# Patient Record
Sex: Male | Born: 1945 | Race: White | Hispanic: No | Marital: Married | Smoking: Never smoker
Health system: Southern US, Community
[De-identification: ages and names within clinical notes are randomized; demographics above are authoritative.]

## PROBLEM LIST (undated history)

## (undated) DIAGNOSIS — R519 Headache, unspecified: Secondary | ICD-10-CM

## (undated) DIAGNOSIS — N189 Chronic kidney disease, unspecified: Secondary | ICD-10-CM

## (undated) DIAGNOSIS — E119 Type 2 diabetes mellitus without complications: Secondary | ICD-10-CM

## (undated) DIAGNOSIS — I499 Cardiac arrhythmia, unspecified: Secondary | ICD-10-CM

## (undated) DIAGNOSIS — I10 Essential (primary) hypertension: Secondary | ICD-10-CM

## (undated) DIAGNOSIS — J45909 Unspecified asthma, uncomplicated: Secondary | ICD-10-CM

## (undated) DIAGNOSIS — K5731 Diverticulosis of large intestine without perforation or abscess with bleeding: Secondary | ICD-10-CM

## (undated) DIAGNOSIS — T8859XA Other complications of anesthesia, initial encounter: Secondary | ICD-10-CM

## (undated) DIAGNOSIS — R51 Headache: Secondary | ICD-10-CM

## (undated) DIAGNOSIS — G4730 Sleep apnea, unspecified: Secondary | ICD-10-CM

## (undated) DIAGNOSIS — T4145XA Adverse effect of unspecified anesthetic, initial encounter: Secondary | ICD-10-CM

## (undated) DIAGNOSIS — I82409 Acute embolism and thrombosis of unspecified deep veins of unspecified lower extremity: Secondary | ICD-10-CM

## (undated) DIAGNOSIS — J449 Chronic obstructive pulmonary disease, unspecified: Secondary | ICD-10-CM

## (undated) HISTORY — PX: CYSTOSCOPY W/ URETERAL STENT REMOVAL: SHX1430

## (undated) HISTORY — DX: Type 2 diabetes mellitus without complications: E11.9

## (undated) HISTORY — PX: KNEE ARTHROSCOPY: SUR90

## (undated) HISTORY — PX: CYSTOSCOPY W/ URETERAL STENT PLACEMENT: SHX1429

---

## 2000-05-15 ENCOUNTER — Emergency Department (HOSPITAL_COMMUNITY): Admission: EM | Admit: 2000-05-15 | Discharge: 2000-05-16 | Payer: Self-pay | Admitting: *Deleted

## 2000-05-15 ENCOUNTER — Encounter: Payer: Self-pay | Admitting: *Deleted

## 2003-01-29 ENCOUNTER — Ambulatory Visit (HOSPITAL_COMMUNITY): Admission: RE | Admit: 2003-01-29 | Discharge: 2003-01-29 | Payer: Self-pay | Admitting: Pulmonary Disease

## 2003-01-29 ENCOUNTER — Encounter: Payer: Self-pay | Admitting: Pulmonary Disease

## 2006-07-11 HISTORY — PX: EYE SURGERY: SHX253

## 2006-12-18 ENCOUNTER — Ambulatory Visit: Payer: Self-pay | Admitting: Pulmonary Disease

## 2006-12-29 ENCOUNTER — Encounter: Admission: RE | Admit: 2006-12-29 | Discharge: 2006-12-29 | Payer: Self-pay | Admitting: Orthopedic Surgery

## 2006-12-31 ENCOUNTER — Encounter: Admission: RE | Admit: 2006-12-31 | Discharge: 2006-12-31 | Payer: Self-pay | Admitting: Orthopedic Surgery

## 2007-01-16 ENCOUNTER — Encounter: Admission: RE | Admit: 2007-01-16 | Discharge: 2007-02-14 | Payer: Self-pay | Admitting: Orthopedic Surgery

## 2007-01-18 ENCOUNTER — Ambulatory Visit: Payer: Self-pay | Admitting: Critical Care Medicine

## 2007-02-12 ENCOUNTER — Ambulatory Visit: Payer: Self-pay | Admitting: Critical Care Medicine

## 2007-04-11 ENCOUNTER — Ambulatory Visit (HOSPITAL_COMMUNITY): Admission: RE | Admit: 2007-04-11 | Discharge: 2007-04-11 | Payer: Self-pay | Admitting: Urology

## 2007-04-16 ENCOUNTER — Ambulatory Visit (HOSPITAL_COMMUNITY): Admission: RE | Admit: 2007-04-16 | Discharge: 2007-04-16 | Payer: Self-pay | Admitting: Urology

## 2007-04-18 ENCOUNTER — Ambulatory Visit: Payer: Self-pay | Admitting: Critical Care Medicine

## 2007-04-18 DIAGNOSIS — J309 Allergic rhinitis, unspecified: Secondary | ICD-10-CM | POA: Insufficient documentation

## 2007-04-18 DIAGNOSIS — J45909 Unspecified asthma, uncomplicated: Secondary | ICD-10-CM | POA: Insufficient documentation

## 2007-05-14 ENCOUNTER — Ambulatory Visit (HOSPITAL_COMMUNITY): Admission: RE | Admit: 2007-05-14 | Discharge: 2007-05-14 | Payer: Self-pay | Admitting: Urology

## 2007-07-16 ENCOUNTER — Ambulatory Visit: Payer: Self-pay | Admitting: Critical Care Medicine

## 2007-08-28 ENCOUNTER — Ambulatory Visit: Payer: Self-pay | Admitting: Critical Care Medicine

## 2007-09-24 ENCOUNTER — Ambulatory Visit (HOSPITAL_COMMUNITY): Admission: RE | Admit: 2007-09-24 | Discharge: 2007-09-24 | Payer: Self-pay | Admitting: Urology

## 2007-10-15 ENCOUNTER — Ambulatory Visit: Payer: Self-pay | Admitting: Critical Care Medicine

## 2007-11-20 ENCOUNTER — Ambulatory Visit: Payer: Self-pay | Admitting: Pulmonary Disease

## 2007-12-24 ENCOUNTER — Ambulatory Visit: Payer: Self-pay | Admitting: Critical Care Medicine

## 2008-01-28 ENCOUNTER — Ambulatory Visit: Payer: Self-pay | Admitting: Critical Care Medicine

## 2008-04-07 ENCOUNTER — Ambulatory Visit: Payer: Self-pay | Admitting: Critical Care Medicine

## 2008-06-10 ENCOUNTER — Ambulatory Visit: Payer: Self-pay | Admitting: Critical Care Medicine

## 2008-06-12 ENCOUNTER — Telehealth (INDEPENDENT_AMBULATORY_CARE_PROVIDER_SITE_OTHER): Payer: Self-pay | Admitting: *Deleted

## 2008-06-24 ENCOUNTER — Ambulatory Visit: Payer: Self-pay | Admitting: Critical Care Medicine

## 2008-07-11 HISTORY — PX: CERVICAL FUSION: SHX112

## 2008-07-11 HISTORY — PX: CYSTOSCOPY WITH RETROGRADE PYELOGRAM, URETEROSCOPY AND STENT PLACEMENT: SHX5789

## 2008-07-11 HISTORY — PX: CYSTOSCOPY W/ URETERAL STENT REMOVAL: SHX1430

## 2008-07-29 ENCOUNTER — Telehealth (INDEPENDENT_AMBULATORY_CARE_PROVIDER_SITE_OTHER): Payer: Self-pay | Admitting: *Deleted

## 2008-09-02 ENCOUNTER — Ambulatory Visit: Payer: Self-pay | Admitting: Critical Care Medicine

## 2008-09-02 DIAGNOSIS — J018 Other acute sinusitis: Secondary | ICD-10-CM | POA: Insufficient documentation

## 2008-09-03 ENCOUNTER — Telehealth: Payer: Self-pay | Admitting: Critical Care Medicine

## 2008-11-03 ENCOUNTER — Ambulatory Visit: Payer: Self-pay | Admitting: Critical Care Medicine

## 2008-12-20 ENCOUNTER — Emergency Department (HOSPITAL_COMMUNITY): Admission: EM | Admit: 2008-12-20 | Discharge: 2008-12-20 | Payer: Self-pay | Admitting: Emergency Medicine

## 2008-12-20 ENCOUNTER — Encounter (INDEPENDENT_AMBULATORY_CARE_PROVIDER_SITE_OTHER): Payer: Self-pay | Admitting: Emergency Medicine

## 2008-12-20 ENCOUNTER — Ambulatory Visit: Payer: Self-pay | Admitting: *Deleted

## 2009-01-05 ENCOUNTER — Ambulatory Visit: Payer: Self-pay | Admitting: Critical Care Medicine

## 2009-05-01 ENCOUNTER — Ambulatory Visit: Payer: Self-pay | Admitting: Critical Care Medicine

## 2009-07-27 ENCOUNTER — Ambulatory Visit (HOSPITAL_COMMUNITY): Admission: RE | Admit: 2009-07-27 | Discharge: 2009-07-27 | Payer: Self-pay | Admitting: Urology

## 2009-09-04 ENCOUNTER — Ambulatory Visit: Payer: Self-pay | Admitting: Critical Care Medicine

## 2009-12-01 ENCOUNTER — Ambulatory Visit: Payer: Self-pay | Admitting: Critical Care Medicine

## 2009-12-18 ENCOUNTER — Emergency Department (HOSPITAL_COMMUNITY): Admission: EM | Admit: 2009-12-18 | Discharge: 2009-12-18 | Payer: Self-pay | Admitting: Emergency Medicine

## 2009-12-18 ENCOUNTER — Ambulatory Visit: Payer: Self-pay | Admitting: Vascular Surgery

## 2009-12-18 ENCOUNTER — Encounter (INDEPENDENT_AMBULATORY_CARE_PROVIDER_SITE_OTHER): Payer: Self-pay | Admitting: Emergency Medicine

## 2010-04-20 ENCOUNTER — Encounter: Admission: RE | Admit: 2010-04-20 | Discharge: 2010-04-20 | Payer: Self-pay | Admitting: Specialist

## 2010-04-28 ENCOUNTER — Ambulatory Visit: Payer: Self-pay | Admitting: Critical Care Medicine

## 2010-05-05 ENCOUNTER — Telehealth: Payer: Self-pay | Admitting: Critical Care Medicine

## 2010-07-16 ENCOUNTER — Ambulatory Visit
Admission: RE | Admit: 2010-07-16 | Discharge: 2010-07-16 | Payer: Self-pay | Source: Home / Self Care | Attending: Critical Care Medicine | Admitting: Critical Care Medicine

## 2010-07-21 ENCOUNTER — Ambulatory Visit
Admission: RE | Admit: 2010-07-21 | Discharge: 2010-07-21 | Payer: Self-pay | Source: Home / Self Care | Attending: Critical Care Medicine | Admitting: Critical Care Medicine

## 2010-07-21 ENCOUNTER — Telehealth: Payer: Self-pay | Admitting: Critical Care Medicine

## 2010-07-31 ENCOUNTER — Other Ambulatory Visit: Payer: Self-pay | Admitting: Neurosurgery

## 2010-07-31 DIAGNOSIS — M5410 Radiculopathy, site unspecified: Secondary | ICD-10-CM

## 2010-07-31 DIAGNOSIS — M542 Cervicalgia: Secondary | ICD-10-CM

## 2010-08-10 NOTE — Assessment & Plan Note (Signed)
Summary: Pulmonary OV   Primary Provider/Referring Provider:  Dr. Lerry L[facility] @ [facility], fax # [phone]  CC:  4 month follow up.  states he is waking up at night with incrased SOB x1 wk and states he is coughing but only prod 1-2 times daily with green mucus x2wk.  had 1 episode of wheezing x2days ago.  denies chest tightness.  [name] [name]  History of Present Illness: Pulmonary OV:  This is a 65 year old, white male with severe persistent asthma.   September 04, 2009 10:46 AM Started with at bedtime dyspnea,  notes some coughing for  two weeks,  some pndrip,   Noting some wheeze.   Pt denies any significant sore throat, nasal congestion or excess secretions, fever, chills, sweats, unintended weight loss, pleurtic or exertional chest pain, orthopnea PND, or leg swelling Pt denies any increase in rescue therapy over baseline, denies waking up needing it or having any early am or nocturnal exacerbations of coughing/wheezing/or dyspnea.    Asthma History    Asthma Control Assessment:    Age range: 12+ years    Symptoms: >2 days/week    Nighttime Awakenings: 1-3/week    Interferes w/ normal activity: some limitations    SABA use (not for EIB): 0-2 days/week    ATAQ questionnaire: 0    FEV1: 3.35 liters (today)    Exacerbations requiring oral systemic steroids: 0-1/year    Asthma Control Assessment: Not Well Controlled   Preventive Screening-Counseling & Management  Alcohol-Tobacco     Smoking Status: never  Current Medications (verified): 1)  Azor 5-20 Mg  Tabs (Amlodipine-Olmesartan) .... One By Mouth Once Daily 2)  Hydrochlorothiazide   Powd (Hydrochlorothiazide) .[name] [name].. 12.5 By Mouth Once Daily 3)  Symbicort 160-4.5 Mcg/act  Aero (Budesonide-Formoterol Fumarate) .... Two Puff Two Times A Day 4)  Ventolin Hfa 108 (90 Base) Mcg/act  Aers (Albuterol Sulfate) .[name] [name].. 1-2 Puffs Every 4-6 Hours As Needed 5)  Potassium Citrate 1080 Mg  Tbcr (Potassium Citrate) .... 2 By  Mouth Two Times A Day 6)  Qvar 80 Mcg/act Aers (Beclomethasone Dipropionate) .... 2 Puffs Two Times A Day 7)  Albuterol Sulfate (2.5 Mg/63ml) 0.083% Nebu (Albuterol Sulfate) .... As Needed  Allergies (verified): 1)  ! Penicillin  Past History:  Past medical, surgical, family and social histories (including risk factors) reviewed, and no changes noted (except as noted below).  Past Medical History: Reviewed history from 04/07/2008 and no changes required. Allergic rhinitis Asthma    -FeV1 108%  Fef 25-75 58% 1/09  Family History: Reviewed history from 08/28/2007 and no changes required. MI/Heart Attack grandmother  Diabetes  Social History: Reviewed history from 01/28/2008 and no changes required. Patient states former smoker.  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  The patient denies shortness of breath at rest, coughing up blood, chest pain, irregular heartbeats, acid heartburn, indigestion, loss of appetite, weight change, abdominal pain, difficulty swallowing, sore throat, tooth/dental problems, headaches, nasal congestion/difficulty breathing through nose, sneezing, itching, ear ache, anxiety, depression, hand/feet swelling, joint stiffness or pain, rash, change in color of mucus, and fever.    Vital Signs:  Patient profile:   65 year old male Height:      67 inches Weight:      174.38 pounds BMI:     27.41 O2 Sat:  96 % on Room air Temp:     98.4 degrees F oral Pulse rate:   85 / minute BP sitting:   122 / 70  (left arm) Cuff size:   regular  Vitals Entered By: Gweneth Dimitri RN (September 04, 2009 10:29 AM)  O2 Flow:  Room air CC: 4 month follow up.  states he is waking up at night with incrased SOB x1 wk, states he is coughing but only prod 1-2 times daily with green mucus  x2wk.  had 1 episode of wheezing x2days ago.  denies chest tightness.   Comments Medications reviewed with patient Daytime contact number verified with patient. Gweneth Dimitri RN  September 04, 2009 10:33 AM    Physical Exam  Additional Exam:  Gen: Pleasant, well nourished, in no distress ENT: no lesions, no postnasal drip, mild nasal erythema with nasal purulence Neck: No JVD, no TMG, no carotid bruits Lungs: no use of accessory muscles, no dullness to percussion, distant bs Cardiovascular: RRR, heart sounds normal, no murmurs or gallops, no peripheral edema Musculoskeletal: No deformities, no cyanosis or clubbing     Pre-Spirometry FEV1    Value: 3.35 L     Impression & Recommendations:  Problem # 1:  OTHER ACUTE SINUSITIS (ICD-461.8) Assessment Deteriorated Acute sinusitis with asthma flare plan Depomedrol 120mg  injection will be given Zpak for five days No change in inhalers Use the NeilMed nasal rinse at least daily  Return 3 months His updated medication list for this problem includes:    Zithromax Z-pak 250 Mg Tabs (Azithromycin) .[name] [name]... As directed generic please  Orders: Depo- Medrol 40mg  (J1030) Depo- Medrol 80mg  (J1040) Admin of Therapeutic Inj  intramuscular or subcutaneous (36644)  Medications Added to Medication List This Visit: 1)  Zithromax Z-pak 250 Mg Tabs (Azithromycin) .... As directed generic please  Complete Medication List: 1)  Azor 5-20 Mg Tabs (Amlodipine-olmesartan) .... One by mouth once daily 2)  Hydrochlorothiazide Powd (Hydrochlorothiazide) .[name] [name].. 12.5 by mouth once daily 3)  Symbicort 160-4.5 Mcg/act Aero (Budesonide-formoterol fumarate) .... Two puff two times a day 4)  Ventolin Hfa 108 (90 Base) Mcg/act Aers (Albuterol sulfate) .[name] [name].. 1-2 puffs every 4-6 hours as needed 5)  Potassium Citrate 1080 Mg Tbcr (Potassium citrate) .... 2 by mouth two times a day 6)  Qvar 80 Mcg/act Aers (Beclomethasone dipropionate) .... 2 puffs two times a day 7)   Albuterol Sulfate (2.5 Mg/63ml) 0.083% Nebu (Albuterol sulfate) .... As needed 8)  Zithromax Z-pak 250 Mg Tabs (Azithromycin) .... As directed generic please  Patient Instructions: 1)  Depomedrol 120mg  injection will be given 2)  Zpak for five days 3)  No change in inhalers 4)  Use the NeilMed nasal rinse at least daily washing out both nares thoroughly.  Place one packet of Sinus Wash ingredients into the nasal wash bottle then fill to the dotted line with lukewarm tap water.  Lean over the sink and rinse each nostril out thoroughly and avoid letting the rinse go into the throat.   5)  Return 3 months Prescriptions: ZITHROMAX Z-PAK 250 MG  TABS (AZITHROMYCIN) As directed generic please  #1 pak x 0   Entered and Authorized by:   Storm Frisk MD   Signed by:   Storm Frisk MD on 09/04/2009   Method used:   Electronically to        [facility] [address] #[id number]* (retail)       [address]       [place],  [place]  [address]       Ph: [phone]       Fax: [phone]   RxID:   [id number] WUXL 24 MCG/ACT AERS (BECLOMETHASONE DIPROPIONATE) 2 puffs two times a day  #7.3 Gram x 5   Entered and Authorized by:   Storm Frisk MD   Signed by:   Storm Frisk MD on 09/04/2009   Method used:   Electronically to        [facility] [address] #[id number]* (retail)       [address] [place], [place]  [address]       Ph: [phone]       Fax: [phone]   RxID:   [id number]    Immunization History:  Pneumovax Immunization History:    Pneumovax:  historical (04/10/2008)    Medication Administration  Injection # 1:    Medication: Depo- Medrol 40mg     Diagnosis: OTHER ACUTE SINUSITIS (ICD-461.8)    Route: IM    Site: LUOQ gluteus    Exp Date: 05/2012    Lot #: [id number]    Mfr: Pharmacia    Patient tolerated injection without complications    Given by: Gweneth Dimitri RN (September 04, 2009 11:01 AM)  Injection # 2:    Medication: Depo- Medrol 80mg      Diagnosis: OTHER ACUTE SINUSITIS (ICD-461.8)    Route: IM    Site: LUOQ gluteus    Exp Date: 05/2012    Lot #: [id number]    Mfr: Pharmacia    Patient tolerated injection without complications    Given by: Gweneth Dimitri RN (September 04, 2009 11:01 AM)  Orders Added: 1)  Depo- Medrol 40mg  [J1030] 2)  Depo- Medrol 80mg  [J1040] 3)  Admin of Therapeutic Inj  intramuscular or subcutaneous [60109]

## 2010-08-10 NOTE — Progress Notes (Signed)
Summary: PA denial for [facility]  Phone Note Outgoing Call   Call placed by: Reynaldo Minium CMA,  May 05, 2010 4:37 PM Call placed to: [place] Dept-Medicaid Summary of Call: Recieved PA ([facility] [phone])for patients [facility]-called [phone]- insurance will not cover Rx until pt has tried and failed Advair. I have sent this message to PW and given the paper PA information to Crystal. Please advise. Initial call taken by: Reynaldo Minium CMA,  May 05, 2010 4:38 PM  Follow-up for Phone Call        We will need to appeal this. He has failed advair in the past and is well controlled on dulera  Follow-up by: Storm Frisk MD,  May 05, 2010 5:04 PM  Additional Follow-up for Phone Call Additional follow up Details #1::        Called [employer] at number above for the appeals process.  Spoke with Sasha in the appeals department.  Per Sasha, both the docter and pt will receive a letter in the mail regarding the denial of the medication.  Along with this letter will be the process of the appeal.  Will forward message to PW so he is aware.  Gweneth Dimitri RN  May 07, 2010 5:21 PM     Additional Follow-up for Phone Call Additional follow up Details #2::    will await appeal form Follow-up by: Storm Frisk MD,  May 10, 2010 5:33 PM  Additional Follow-up for Phone Call Additional follow up Details #3:: Details for Additional Follow-up Action Taken: ok but note pt has been on advair in past and failed Additional Follow-up by: Storm Frisk MD,  May 24, 2010 3:32 PM   Appended Document: PA denial for [facility], spoke with pt to see if he has received denial letter from insurance company regarding the dulera.  Per pt, he has not and has filled this med with no problems.  I called [facility] on [address] and spoke with [employer].  Per Elmore Guise was filled on Nov 5 and insurance paid for it.  States there is no prior auth needed for dulera at this time.

## 2010-08-10 NOTE — Assessment & Plan Note (Signed)
Summary: Pulmonary OV   Primary Provider/Referring Provider:  Dr. Lerry Liner @ [facility], fax # [phone]  CC:  3  month followup.  Pt c/o increased SOB x 2 wks.  He states that her gets SOB with exertion and sometimes wakes up in the night with SOB.  He also c/o prod cough with yellow/green sputum x 2 wks.  .  History of Present Illness: Pulmonary OV:  This is a 65 year old, white male with severe persistent asthma.  Dec 01, 2009 10:37 AM f/u asthma.  Still off and on with issues with dyspnea.  Worse last few weeks.  Notes some cough.  Notes some wheeze.  No other new issues.   Pt denies any significant sore throat, nasal congestion or excess secretions, fever, chills, sweats, unintended weight loss, pleurtic or exertional chest pain, orthopnea PND, or leg swelling Pt has used SABA more last few weeks.  No sinus pain. No f/c/s.    Asthma History    Asthma Control Assessment:    Age range: 12+ years    Symptoms: throughout the day    Nighttime Awakenings: 1-3/week    Interferes w/ normal activity: some limitations    SABA use (not for EIB): 0-2 days/week    ATAQ questionnaire: 1-2    FEV1: 3.35 liters (today)    Exacerbations requiring oral systemic steroids: 0-1/year    Asthma Control Assessment: Very Poorly Controlled   Preventive Screening-Counseling & Management  Alcohol-Tobacco     Smoking Status: quit > 6 months  Current Medications (verified): 1)  Azor 5-20 Mg  Tabs (Amlodipine-Olmesartan) .... One By Mouth Once Daily 2)  Hydrochlorothiazide   Powd (Hydrochlorothiazide) .Marland Kitchen.. 12.5 By Mouth Once Daily 3)  Symbicort 160-4.5 Mcg/act  Aero (Budesonide-Formoterol Fumarate) .... Two Puff Two Times A Day 4)  Ventolin Hfa 108 (90 Base) Mcg/act  Aers (Albuterol Sulfate) .Marland Kitchen.. 1-2 Puffs Every 4-6 Hours As Needed 5)  Potassium Citrate 1080 Mg  Tbcr (Potassium Citrate) .... 2 By Mouth Two Times A Day 6)  Qvar 80 Mcg/act Aers (Beclomethasone Dipropionate) ....  2 Puffs Two Times A Day 7)  Albuterol Sulfate (2.5 Mg/74ml) 0.083% Nebu (Albuterol Sulfate) .... As Needed  Allergies (verified): 1)  ! Penicillin  Past History:  Past medical, surgical, family and social histories (including risk factors) reviewed, and no changes noted (except as noted below).  Past Medical History: Reviewed history from 04/07/2008 and no changes required. Allergic rhinitis Asthma    -FeV1 108%  Fef 25-75 58% 1/09  Family History: Reviewed history from 08/28/2007 and no changes required. MI/Heart Attack grandmother  Diabetes  Social History: Reviewed history from 01/28/2008 and no changes required. Patient states former smoker.  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  The patient denies shortness of breath at rest, productive cough, non-productive cough, coughing up blood, chest pain, irregular heartbeats, acid heartburn, indigestion, loss of appetite, weight change, abdominal pain, difficulty swallowing, sore throat, tooth/dental problems, headaches, nasal congestion/difficulty breathing through nose, sneezing, itching, ear ache, anxiety, depression, hand/feet swelling, joint stiffness or pain, rash, change in color of mucus, and fever.    Vital Signs:  Patient profile:   65 year old male Weight:      172 pounds O2 Sat:      94 % on Room air Temp:  97.6 degrees F oral Pulse rate:   85 / minute BP sitting:   118 / 70  (left arm)  Vitals Entered By: Vernie Murders (Dec 01, 2009 10:25 AM)  O2 Flow:  Room air   Physical Exam  Additional Exam:  Gen: Pleasant, well nourished, in no distress ENT: no lesions, no postnasal drip, mild nasal erythema with nasal purulence Neck: No JVD, no TMG, no carotid bruits Lungs: no use of accessory muscles, no dullness to percussion, distant bs, few exp  wheezes Cardiovascular: RRR, heart sounds normal, no murmurs or gallops, no peripheral edema Musculoskeletal: No deformities, no cyanosis or clubbing     Pre-Spirometry FEV1    Value: 3.35 L     Impression & Recommendations:  Problem # 1:  ASTHMA (ICD-493.90) Assessment Deteriorated Moderate persistent asthma with allergic rhinitis, now worsening with atopy  plan: Pulse prednisone no change in maintainence meds  Medications Added to Medication List This Visit: 1)  Prednisone 10 Mg Tabs (Prednisone) .... Take as directed 4 each am x3days, 3 x 3days, 2 x 3days, 1 x 3days then stop  Complete Medication List: 1)  Azor 5-20 Mg Tabs (Amlodipine-olmesartan) .... One by mouth once daily 2)  Hydrochlorothiazide Powd (Hydrochlorothiazide) .Marland Kitchen.. 12.5 by mouth once daily 3)  Symbicort 160-4.5 Mcg/act Aero (Budesonide-formoterol fumarate) .... Two puff two times a day 4)  Ventolin Hfa 108 (90 Base) Mcg/act Aers (Albuterol sulfate) .Marland Kitchen.. 1-2 puffs every 4-6 hours as needed 5)  Potassium Citrate 1080 Mg Tbcr (Potassium citrate) .... 2 by mouth two times a day 6)  Qvar 80 Mcg/act Aers (Beclomethasone dipropionate) .... 2 puffs two times a day 7)  Albuterol Sulfate (2.5 Mg/52ml) 0.083% Nebu (Albuterol sulfate) .... As needed 8)  Prednisone 10 Mg Tabs (Prednisone) .... Take as directed 4 each am x3days, 3 x 3days, 2 x 3days, 1 x 3days then stop  Other Orders: Est. Patient Level IV ([address]) Prescription Created Electronically [phone])  Patient Instructions: 1)  Take prednisone 10mg  4 each am x3days, 3 x 3days, 2 x 3days, 1 x 3days then stop 2)  No change in inhaled medications 3)  Return 4 months, sooner if unimproved Prescriptions: PREDNISONE 10 MG  TABS (PREDNISONE) Take as directed 4 each am x3days, 3 x 3days, 2 x 3days, 1 x 3days then stop  #30 x 0   Entered and Authorized by:   Storm Frisk MD   Signed by:   Storm Frisk MD on 12/01/2009   Method used:   Electronically to         [facility] [address] [facility] [address] #[id number]* (retail)       [address] [place], [place]  [address]       Ph: [phone]       Fax: [phone]   RxID:   [id number]   Appended Document: Pulmonary OV fax Lerry Liner

## 2010-08-10 NOTE — Assessment & Plan Note (Signed)
Summary: Pulmonary OV   Primary Provider/Referring Provider:  Dr. Lerry Liner @ [facility], fax # [phone]  CC:  4 month follow up.  Pt states breathing comes and goes - some weeks are better than others  but overall unchanged.  Cough - prod at times with yellow mucus.  Denies wheezing and chest tightness.  Marland Kitchen  History of Present Illness: Pulmonary OV:  This is a 65 year old, white male with severe persistent asthma.  Dec 01, 2009 10:37 AM f/u asthma.  Still off and on with issues with dyspnea.  Worse last few weeks.  Notes some cough.  Notes some wheeze.  No other new issues.   Pt denies any significant sore throat, nasal congestion or excess secretions, fever, chills, sweats, unintended weight loss, pleurtic or exertional chest pain, orthopnea PND, or leg swelling Pt has used SABA more last few weeks.  No sinus pain. No f/c/s.  April 28, 2010 9:28 AM Doing ok so far.  Notes some cough.  No new issues. Pt denies any significant sore throat, nasal congestion or excess secretions, fever, chills, sweats, unintended weight loss, pleurtic or exertional chest pain, orthopnea PND, or leg swelling Pt denies any increase in rescue therapy over baseline, denies waking up needing it or having any early am or nocturnal exacerbations of coughing/wheezing/or dyspnea.   Asthma History    Asthma Control Assessment:    Age range: 12+ years    Symptoms: 0-2 days/week    Nighttime Awakenings: 0-2/month    Interferes w/ normal activity: no limitations    SABA use (not for EIB): 0-2 days/week    ATAQ questionnaire: 0    FEV1: 3.35 liters (today)    Exacerbations requiring oral systemic steroids: 0-1/year    Asthma Control Assessment: Well Controlled   Current Medications (verified): 1)  Azor 5-20 Mg  Tabs (Amlodipine-Olmesartan) .... One By Mouth Once Daily 2)  Hydrochlorothiazide 12.5 Mg Caps (Hydrochlorothiazide) .... Take 1 Capsule By Mouth Once A Day 3)  Symbicort 160-4.5  Mcg/act  Aero (Budesonide-Formoterol Fumarate) .... Two Puff Two Times A Day 4)  Ventolin Hfa 108 (90 Base) Mcg/act  Aers (Albuterol Sulfate) .Marland Kitchen.. 1-2 Puffs Every 4-6 Hours As Needed 5)  Potassium Citrate 1080 Mg  Tbcr (Potassium Citrate) .... 2 By Mouth Two Times A Day 6)  Qvar 80 Mcg/act Aers (Beclomethasone Dipropionate) .... 2 Puffs Two Times A Day 7)  Albuterol Sulfate (2.5 Mg/31ml) 0.083% Nebu (Albuterol Sulfate) .... As Needed  Allergies (verified): 1)  ! Penicillin  Past History:  Past medical, surgical, family and social histories (including risk factors) reviewed, and no changes noted (except as noted below).  Past Medical History: Reviewed history from 04/07/2008 and no changes required. Allergic rhinitis Asthma    -FeV1 108%  Fef 25-75 58% 1/09  Family History: Reviewed history from 08/28/2007 and no changes required. MI/Heart Attack grandmother  Diabetes  Social History: Reviewed history from 01/28/2008 and no changes required. Patient states former smoker.   Quit in 1980.  1 ppd x 15 yrs.  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  The patient denies shortness of breath at rest, productive cough, non-productive cough, coughing up blood, chest pain, irregular heartbeats, acid heartburn, indigestion, loss of appetite, weight change, abdominal pain, difficulty swallowing, sore throat, tooth/dental problems, headaches, nasal congestion/difficulty breathing through nose, sneezing, itching, ear ache, anxiety, depression,  hand/feet swelling, joint stiffness or pain, rash, change in color of mucus, and fever.    Vital Signs:  Patient profile:   65 year old male Height:      67 inches Weight:      171.50 pounds BMI:     26.96 O2 Sat:      94 % on Room air Temp:     98.2 degrees F oral Pulse rate:   83 / minute BP sitting:   112 / 76  (left arm) Cuff  size:   regular  Vitals Entered By: Gweneth Dimitri RN (April 28, 2010 9:24 AM)  O2 Flow:  Room air  Contraindications/Deferment of Procedures/Staging:    Treatment: Flu Shot    Contraindication: egg allergy  CC: 4 month follow up.  Pt states breathing comes and goes - some weeks are better than others  but overall unchanged.  Cough - prod at times with yellow mucus.  Denies wheezing and chest tightness.   Comments Medications reviewed with patient Daytime contact number verified with patient. Gweneth Dimitri RN  April 28, 2010 9:25 AM    Physical Exam  Additional Exam:  Gen: Pleasant, well nourished, in no distress ENT: no lesions, no postnasal drip, mild nasal erythema with nasal purulence Neck: No JVD, no TMG, no carotid bruits Lungs: no use of accessory muscles, no dullness to percussion,  Cardiovascular: RRR, heart sounds normal, no murmurs or gallops, no peripheral edema Musculoskeletal: No deformities, no cyanosis or clubbing     Pre-Spirometry FEV1    Value: 3.35 L     Impression & Recommendations:  Problem # 1:  ASTHMA (ICD-493.90) Assessment Improved  Moderate persistent asthma with allergic rhinitis improved   plan: transition from qvar/symbicort to dulera 200 two puff two times a day   Medications Added to Medication List This Visit: 1)  Hydrochlorothiazide 12.5 Mg Caps (Hydrochlorothiazide) .... Take 1 capsule by mouth once a day 2)  Dulera 200-5 Mcg/act Aero (Mometasone furo-formoterol fum) .... 2 puffs twice per day  Complete Medication List: 1)  Azor 5-20 Mg Tabs (Amlodipine-olmesartan) .... One by mouth once daily 2)  Hydrochlorothiazide 12.5 Mg Caps (Hydrochlorothiazide) .... Take 1 capsule by mouth once a day 3)  Dulera 200-5 Mcg/act Aero (Mometasone furo-formoterol fum) .... 2 puffs twice per day 4)  Potassium Citrate 1080 Mg Tbcr (Potassium citrate) .... 2 by mouth two times a day 5)  Albuterol Sulfate (2.5 Mg/90ml) 0.083% Nebu (Albuterol sulfate)  .... As needed 6)  Ventolin Hfa 108 (90 Base) Mcg/act Aers (Albuterol sulfate) .Marland Kitchen.. 1-2 puffs every 4-6 hours as needed  Other Orders: Est. Patient Level III ([address])  Patient Instructions: 1)  Stop symbicort 2)  Start Dulera two puff twice daily 3)  Stop Qvar 4)  Return 4 months Prescriptions: DULERA 200-5 MCG/ACT AERO (MOMETASONE FURO-FORMOTEROL FUM) 2 puffs twice per day  #1 x 6   Entered and Authorized by:   Storm Frisk MD   Signed by:   Storm Frisk MD on 04/28/2010   Method used:   Electronically to        [facility] [address] #[id number]* (retail)       [address] [place], [place]  [address]       Ph: [phone]       Fax: [phone]   RxID:   [id number]     Prevention & Chronic Care Immunizations   Influenza vaccine: Not documented    Tetanus booster:  Not documented    Pneumococcal vaccine: Historical  (04/10/2008)    H. zoster vaccine: Not documented  Colorectal Screening   Hemoccult: Not documented    Colonoscopy: Not documented  Other Screening   PSA: Not documented   Smoking status: quit > 6 months  (12/01/2009)  Lipids   Total Cholesterol: Not documented   LDL: Not documented   LDL Direct: Not documented   HDL: Not documented   Triglycerides: Not documented  Appended Document: Pulmonary OV fax Lerry Liner

## 2010-08-12 NOTE — Assessment & Plan Note (Signed)
Summary: Pulmonary OV   Primary Provider/Referring Provider:  Dr. Lerry Liner @ [facility], fax # [phone]  CC:  Pt c/o chills last night, left chest soreness, dyspnea, dry cough, PND, fever. On 2nd round of abx has not started Biaxin 500mg , and currently on Prednisone 30mg  daily.  History of Present Illness: Pulmonary OV:  This is a 65 year old, white male with severe persistent asthma.  July 16, 2010 2:54 PM f/u asthma.  more dyspnea  for one week.  more sweats.  gasping for air.  No real chest pain chest is sore from coughing.  notes more pndrip.  has a dry cough   saw pcp and O2 was ok , gave a neb rx.  and coughed up gray.  ran a fever and injection rocephin and depomedrol injection  metoprolol 25mg /d for hr up and just started today 1/6  July 21, 2010 3:09 PM Now wiht  a fever and chills.  Mucus now is minimal.  Feels congested.  Throat feels irritated. not on the biaxin yet, still on cipro.   Pt still coughing   Preventive Screening-Counseling & Management  Alcohol-Tobacco     Alcohol drinks/day: 0     Smoking Status: quit     Packs/Day: 1.0     Year Started: 1974     Year Quit: 1981     Pack years: 7  Current Medications (verified): 1)  Azor 5-20 Mg  Tabs (Amlodipine-Olmesartan) .... One By Mouth Once Daily 2)  Hydrochlorothiazide 12.5 Mg Caps (Hydrochlorothiazide) .... Take 1 Capsule By Mouth Once A Day 3)  Dulera 200-5 Mcg/act Aero (Mometasone Furo-Formoterol Fum) .... 2 Puffs Twice Per Day 4)  Potassium Citrate 1080 Mg  Tbcr (Potassium Citrate) .... 2 By Mouth Two Times A Day 5)  Albuterol Sulfate (2.5 Mg/21ml) 0.083% Nebu (Albuterol Sulfate) .... As Needed 6)  Ventolin Hfa 108 (90 Base) Mcg/act  Aers (Albuterol Sulfate) .Marland Kitchen.. 1-2 Puffs Every 4-6 Hours As Needed 7)  Metoprolol Succinate 25 Mg Xr24h-Tab (Metoprolol Succinate) .Marland Kitchen.. 1 By Mouth Daily 8)  Cipro 500 Mg Tabs (Ciprofloxacin Hcl) .... Take 1 Tablet By Mouth Two Times A Day X 7  Days 9)  Prednisone 10 Mg  Tabs (Prednisone) .... Take As Directed 4 Each Am X3days, 3 X 3days, 2 X 3days, 1 X 3days Then Stop 10)  Biaxin 500 Mg  Tabs (Clarithromycin) .... One Tablet By Mouth Twice Daily  Allergies (verified): 1)  ! Penicillin 2)  ! * Influenza Vaccine  Past History:  Past medical, surgical, family and social histories (including risk factors) reviewed, and no changes noted (except as noted below).  Past Medical History: Reviewed history from 04/07/2008 and no changes required. Allergic rhinitis Asthma    -FeV1 108%  Fef 25-75 58% 1/09  Family History: Reviewed history from 08/28/2007 and no changes required. MI/Heart Attack grandmother  Diabetes  Social History: Reviewed history from 04/28/2010 and no changes required. Patient states former smoker.   Quit in 1980.  1 ppd x 15 yrs.  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  The patient denies shortness of breath at rest, coughing up blood, chest pain, irregular heartbeats, acid heartburn, indigestion, loss of appetite, weight change, abdominal pain, difficulty swallowing, sore throat, tooth/dental problems, headaches, nasal congestion/difficulty breathing through nose, sneezing,  itching, ear ache, anxiety, depression, hand/feet swelling, joint stiffness or pain, rash, change in color of mucus, and fever.    Vital Signs:  Patient profile:   65 year old male Height:      67 inches Weight:      165 pounds BMI:     25.94 O2 Sat:      93 % on Room air Temp:     98.2 degrees F oral Pulse rate:   92 / minute BP sitting:   112 / 68  (left arm) Cuff size:   regular  Vitals Entered By: Zackery Barefoot CMA (July 21, 2010 2:46 PM)  O2 Flow:  Room air CC: Pt c/o chills last night, left chest soreness, dyspnea,  dry cough, PND, fever. On 2nd round of abx has not started Biaxin 500mg , currently on Prednisone 30mg  daily Comments Medications reviewed with patient Verified contact number and pharmacy with patient Zackery Barefoot CMA  July 21, 2010 2:45 PM    Physical Exam  Additional Exam:  Gen: Pleasant, well nourished, in no distress ENT: no lesions, no postnasal drip, mild nasal erythema with nasal purulence Neck: No JVD, no TMG, no carotid bruits Lungs: no use of accessory muscles, no dullness to percussion,  Cardiovascular: RRR, heart sounds normal, no murmurs or gallops, no peripheral edema Musculoskeletal: No deformities, no cyanosis or clubbing     CXR  Procedure date:  07/21/2010  Findings:      No active disease   Impression & Recommendations:  Problem # 1:  ACUTE BRONCHITIS (ICD-466.0) Assessment Deteriorated acute tracheobronchitis with ongoing flare  No PNA plan cycle biaxin ont pred His updated medication list for this problem includes:    Dulera 200-5 Mcg/act Aero (Mometasone furo-formoterol fum) .Marland Kitchen... 2 puffs twice per day    Albuterol Sulfate (2.5 Mg/61ml) 0.083% Nebu (Albuterol sulfate) .Marland Kitchen... As needed    Ventolin Hfa 108 (90 Base) Mcg/act Aers (Albuterol sulfate) .Marland Kitchen... 1-2 puffs every 4-6 hours as needed    Cipro 500 Mg Tabs (Ciprofloxacin hcl) .Marland Kitchen... Take 1 tablet by mouth two times a day x 7 days  Orders: Est. Patient Level IV (04540) T-2 View CXR (71020TC)  Medications Added to Medication List This Visit: 1)  Cipro 500 Mg Tabs (Ciprofloxacin hcl) .... Take 1 tablet by mouth two times a day x 7 days  Complete Medication List: 1)  Azor 5-20 Mg Tabs (Amlodipine-olmesartan) .... One by mouth once daily 2)  Hydrochlorothiazide 12.5 Mg Caps (Hydrochlorothiazide) .... Take 1 capsule by mouth once a day 3)  Dulera 200-5 Mcg/act Aero (Mometasone furo-formoterol fum) .... 2 puffs twice per day 4)  Potassium Citrate 1080 Mg Tbcr (Potassium citrate) .... 2 by mouth  two times a day 5)  Albuterol Sulfate (2.5 Mg/29ml) 0.083% Nebu (Albuterol sulfate) .... As needed 6)  Ventolin Hfa 108 (90 Base) Mcg/act Aers (Albuterol sulfate) .Marland Kitchen.. 1-2 puffs every 4-6 hours as needed 7)  Metoprolol Succinate 25 Mg Xr24h-tab (Metoprolol succinate) .Marland Kitchen.. 1 by mouth daily 8)  Cipro 500 Mg Tabs (Ciprofloxacin hcl) .... Take 1 tablet by mouth two times a day x 7 days 9)  Prednisone 10 Mg Tabs (Prednisone) .... Take as directed 4 each am x3days, 3 x 3days, 2 x 3days, 1 x 3days then stop  Patient Instructions: 1)  Start Biaxin now 2)  Finish prednisone 3)  No other changes 4)  Return 1 month

## 2010-08-12 NOTE — Assessment & Plan Note (Signed)
Summary: Pulmonary OV   Primary Provider/Referring Provider:  Dr. Lerry Liner @ [facility], fax # [phone]  CC:  Patient c/o incr SOB w/ exert, dry cough, sore throat, and low grade fever x5 days...received Rocephin and Depo Medrol injection on 07/15/2010 w/ Dr. Lerry Liner.  History of Present Illness: Pulmonary OV:  This is a 65 year old, white male with severe persistent asthma.  July 16, 2010 2:54 PM f/u asthma.  more dyspnea  for one week.  more sweats.  gasping for air.  No real chest pain chest is sore from coughing.  notes more pndrip.  has a dry cough   saw pcp and O2 was ok , gave a neb rx.  and coughed up gray.  ran a fever and injection rocephin and depomedrol injection  metoprolol 25mg /d for hr up and just started today 1/6    Asthma History    Asthma Control Assessment:    Age range: 12+ years    Symptoms: throughout the day    Nighttime Awakenings: 1-3/week    Interferes w/ normal activity: some limitations    SABA use (not for EIB): several times per day    ATAQ questionnaire: 1-2    FEV1: 3.35 liters (today)    Exacerbations requiring oral systemic steroids: 0-1/year    Asthma Control Assessment: Very Poorly Controlled    Preventive Screening-Counseling & Management  Alcohol-Tobacco     Smoking Status: quit     Year Quit: 1981     Pack years: 7  Current Medications (verified): 1)  Azor 5-20 Mg  Tabs (Amlodipine-Olmesartan) .... One By Mouth Once Daily 2)  Hydrochlorothiazide 12.5 Mg Caps (Hydrochlorothiazide) .... Take 1 Capsule By Mouth Once A Day 3)  Dulera 200-5 Mcg/act Aero (Mometasone Furo-Formoterol Fum) .... 2 Puffs Twice Per Day 4)  Potassium Citrate 1080 Mg  Tbcr (Potassium Citrate) .... 2 By Mouth Two Times A Day 5)  Albuterol Sulfate (2.5 Mg/42ml) 0.083% Nebu (Albuterol Sulfate) .... As Needed 6)  Ventolin Hfa 108 (90 Base) Mcg/act  Aers (Albuterol Sulfate) .Marland Kitchen.. 1-2 Puffs Every 4-6 Hours As Needed 7)  Metoprolol  Succinate 25 Mg Xr24h-Tab (Metoprolol Succinate) .Marland Kitchen.. 1 By Mouth Daily 8)  Cipro 250 Mg Tabs (Ciprofloxacin Hcl) .Marland Kitchen.. 1 By Mouth Two Times A Day X7 Days  Allergies: 1)  ! Penicillin 2)  ! * Influenza Vaccine  Past History:  Past medical, surgical, family and social histories (including risk factors) reviewed, and no changes noted (except as noted below).  Past Medical History: Reviewed history from 04/07/2008 and no changes required. Allergic rhinitis Asthma    -FeV1 108%  Fef 25-75 58% 1/09  Family History: Reviewed history from 08/28/2007 and no changes required. MI/Heart Attack grandmother  Diabetes  Social History: Reviewed history from 04/28/2010 and no changes required. Patient states former smoker.   Quit in 1980.  1 ppd x 15 yrs.  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  The patient denies shortness of breath at rest, coughing up blood, chest pain, irregular heartbeats, acid heartburn, indigestion, loss of appetite, weight change, abdominal pain, difficulty swallowing, sore throat, tooth/dental problems, headaches, sneezing, itching, ear ache, anxiety, depression, hand/feet swelling, joint stiffness or pain, and fever.  Vital Signs:  Patient profile:   64 year old male Height:      67 inches (170.18 cm) Weight:      169.38 pounds (76.99 kg) BMI:     26.62 O2 Sat:      92 % on Room air Temp:     97.8 degrees F (36.56 degrees C) oral Pulse rate:   118 / minute BP sitting:   108 / 70  (left arm) Cuff size:   regular  Vitals Entered By: Michel Bickers CMA (July 16, 2010 2:49 PM)  O2 Sat at Rest %:  92 O2 Flow:  Room air CC: Patient c/o incr SOB w/ exert, dry cough, sore throat, low  grade fever x5 days...received Rocephin and Depo Medrol injection on 07/15/2010 w/ Dr. Lerry Liner Comments Medications reviewed with patient Michel Bickers CMA  July 16, 2010 2:50 PM   Physical Exam  Additional Exam:  Gen: Pleasant, well nourished, in no distress ENT: no lesions, no postnasal drip, mild nasal erythema with nasal purulence Neck: No JVD, no TMG, no carotid bruits Lungs: no use of accessory muscles, no dullness to percussion,  Cardiovascular: RRR, heart sounds normal, no murmurs or gallops, no peripheral edema Musculoskeletal: No deformities, no cyanosis or clubbing     Pre-Spirometry FEV1    Value: 3.35 L     Impression & Recommendations:  Problem # 1:  OTHER ACUTE SINUSITIS (ICD-461.8) Assessment Deteriorated recurrent sinusitis and AB flare  plan biaxin to follow current cipro pulse pred  His updated medication list for this problem includes:    Cipro 250 Mg Tabs (Ciprofloxacin hcl) .Marland Kitchen... 1 by mouth two times a day x7 days    Biaxin 500 Mg Tabs (Clarithromycin) ..... One tablet by mouth twice daily  Orders: Est. Patient Level IV (16109)  Medications Added to Medication List This Visit: 1)  Metoprolol Succinate 25 Mg Xr24h-tab (Metoprolol succinate) .Marland Kitchen.. 1 by mouth daily 2)  Cipro 250 Mg Tabs (Ciprofloxacin hcl) .Marland Kitchen.. 1 by mouth two times a day x7 days 3)  Prednisone 10 Mg Tabs (Prednisone) .... Take as directed 4 each am x3days, 3 x 3days, 2 x 3days, 1 x 3days then stop 4)  Biaxin 500 Mg Tabs (Clarithromycin) .... One tablet by mouth twice daily  Complete Medication List: 1)  Azor 5-20 Mg Tabs (Amlodipine-olmesartan) .... One by mouth once daily 2)  Hydrochlorothiazide 12.5 Mg Caps (Hydrochlorothiazide) .... Take 1 capsule by mouth once a day 3)  Dulera 200-5 Mcg/act Aero (Mometasone furo-formoterol fum) .... 2 puffs twice per day 4)  Potassium Citrate 1080 Mg Tbcr (Potassium citrate) .... 2 by mouth two times a day 5)  Albuterol Sulfate (2.5 Mg/37ml)  0.083% Nebu (Albuterol sulfate) .... As needed 6)  Ventolin Hfa 108 (90 Base) Mcg/act Aers (Albuterol sulfate) .Marland Kitchen.. 1-2 puffs every 4-6 hours as needed 7)  Metoprolol Succinate 25 Mg Xr24h-tab (Metoprolol succinate) .Marland Kitchen.. 1 by mouth daily 8)  Cipro 250 Mg Tabs (Ciprofloxacin hcl) .Marland Kitchen.. 1 by mouth two times a day x7 days 9)  Prednisone 10 Mg Tabs (Prednisone) .... Take as directed 4 each am x3days, 3 x 3days, 2 x 3days, 1 x 3days then stop 10)  Biaxin 500 Mg Tabs (Clarithromycin) .... One tablet by mouth twice daily  Patient Instructions: 1)  Prednisone  4 each am x3days, 3 x 3days, 2 x 3days, 1 x 3days then stop 2)  finish cipro, then take biaxin 500mg  twice a day for 7days 3)  No other  changes 4)  Return 1 month  Prescriptions: BIAXIN 500 MG  TABS (CLARITHROMYCIN) One tablet by mouth twice daily  #14 x 0   Entered and Authorized by:   Storm Frisk MD   Signed by:   Storm Frisk MD on 07/16/2010   Method used:   Electronically to        [facility] [address] #[id number]* (retail)       [address] [place], [place]  [address]       Ph: [phone]       Fax: [phone]   RxID:   [id number] PREDNISONE 10 MG  TABS (PREDNISONE) Take as directed 4 each am x3days, 3 x 3days, 2 x 3days, 1 x 3days then stop  #30 x 0   Entered and Authorized by:   Storm Frisk MD   Signed by:   Storm Frisk MD on 07/16/2010   Method used:   Electronically to        [facility] [address] #[id number]* (retail)       [address] [place], [place]  [address]       Ph: [phone]       Fax: [phone]   RxID:   [id number]

## 2010-08-12 NOTE — Progress Notes (Signed)
Summary: APPOINTMENT  Phone Note Call from Patient Call back at Home Phone [phone]   Caller: SPOUSE/VICKY Call For: DR Delford Field Summary of Call: MS Bernardy PHONED AND STATED THAT HER HUSBAND SAW DR Delford Field ON FRIDAY AND HE ISNT ANY BETTER STILL RUNNING A FEVER, AND HAS A COUGH SOMETIMES PRODUCTIVE AND SOMETIMES NOT. HE IS EXTREMELY WEEK AND HE GETS OUT OF BREATH WITH VERY LITTLE WALKING. SHE THINKS THAT HE NEEDS TO BE SEEN AGAIN. SHE CAN BE REACHED AT [phone]  Initial call taken by: Vedia Coffer,  July 21, 2010 1:17 PM  Follow-up for Phone Call        called and spoke with pt and he stated that he will finish the cipro tonight---he is still on the pred pak---was seen on 1-6 for same but feels worse today---still couging with congestion and fever.  pain near collar bone--very SOB.  requesting to be seen today--appt made for pt to see PW this afternoon at 2:[address] CMA  July 21, 2010 1:41 PM

## 2010-08-16 ENCOUNTER — Ambulatory Visit: Payer: Self-pay | Admitting: Critical Care Medicine

## 2010-08-22 ENCOUNTER — Ambulatory Visit
Admission: RE | Admit: 2010-08-22 | Discharge: 2010-08-22 | Disposition: A | Discharge status: Home / Self Care | Payer: Medicaid Other | Source: Ambulatory Visit | Attending: Neurosurgery | Admitting: Neurosurgery

## 2010-08-22 DIAGNOSIS — M5410 Radiculopathy, site unspecified: Secondary | ICD-10-CM

## 2010-08-22 DIAGNOSIS — M542 Cervicalgia: Secondary | ICD-10-CM

## 2010-08-30 ENCOUNTER — Encounter: Payer: Medicaid Other | Attending: Specialist

## 2010-08-30 DIAGNOSIS — E119 Type 2 diabetes mellitus without complications: Secondary | ICD-10-CM | POA: Insufficient documentation

## 2010-08-30 DIAGNOSIS — Z713 Dietary counseling and surveillance: Secondary | ICD-10-CM | POA: Insufficient documentation

## 2010-09-22 ENCOUNTER — Encounter: Payer: Self-pay | Admitting: Critical Care Medicine

## 2010-09-22 ENCOUNTER — Ambulatory Visit (INDEPENDENT_AMBULATORY_CARE_PROVIDER_SITE_OTHER): Payer: Medicaid Other | Admitting: Critical Care Medicine

## 2010-09-22 DIAGNOSIS — E119 Type 2 diabetes mellitus without complications: Secondary | ICD-10-CM | POA: Insufficient documentation

## 2010-09-22 DIAGNOSIS — J45909 Unspecified asthma, uncomplicated: Secondary | ICD-10-CM

## 2010-09-23 ENCOUNTER — Ambulatory Visit: Payer: Medicaid Other

## 2010-09-28 NOTE — Assessment & Plan Note (Signed)
Summary: Pulmonary OV   Primary Provider/Referring Provider:  Dr. Lerry Liner @ [facility], fax # [phone]  CC:  2 month follow up.  Pt states breathing improved up until Friday.  Having increased SOB since then but relates this to pollen. Denies chest tightness and cough.  Requesting clearance for neck surgery by Dr. Lovell Sheehan @ [facility].  History of Present Illness: Pulmonary OV:  This is a 64 year old, white male with severe persistent asthma.  September 22, 2010 2:10 PM Since january:  was very dyspneic before,  DM diagnosed.  ? affecting lungs dyspnea is better,  no real cough,  no chest .  Has pn drip now this am. Needs cervical spine surgery for disc disease,    Asthma History    Asthma Control Assessment:    Age range: 12+ years    Symptoms: 0-2 days/week    Nighttime Awakenings: 0-2/month    Interferes w/ normal activity: no limitations    SABA use (not for EIB): 0-2 days/week    ATAQ questionnaire: 0    FEV1: 3.35 liters (today)    Asthma Control Assessment: Well Controlled    Current Medications (verified): 1)  Azor 5-20 Mg  Tabs (Amlodipine-Olmesartan) .... One By Mouth Once Daily 2)  Dulera 200-5 Mcg/act Aero (Mometasone Furo-Formoterol Fum) .... 2 Puffs Twice Per Day 3)  Potassium Citrate 1080 Mg  Tbcr (Potassium Citrate) .... 2 By Mouth Two Times A Day 4)  Albuterol Sulfate (2.5 Mg/48ml) 0.083% Nebu (Albuterol Sulfate) .... As Needed 5)  Ventolin Hfa 108 (90 Base) Mcg/act  Aers (Albuterol Sulfate) .Marland Kitchen.. 1-2 Puffs Every 4-6 Hours As Needed 6)  Metoprolol Succinate 25 Mg Xr24h-Tab (Metoprolol Succinate) .Marland Kitchen.. 1 By Mouth Daily 7)  Metformin Hcl 500 Mg Tabs (Metformin Hcl) .... Take 1 Tablet By Mouth Two Times A Day  Allergies (verified): 1)  ! Penicillin 2)  ! * Influenza Vaccine  Past History:  Past medical, surgical, family and social histories (including risk factors) reviewed, and no changes noted (except as noted below).  Past Medical  History: Allergic rhinitis Asthma    -FeV1 108%  Fef 25-75 58% 1/09 Diabetes, Type 2  Family History: Reviewed history from 08/28/2007 and no changes required. MI/Heart Attack grandmother  Diabetes  Social History: Reviewed history from 04/28/2010 and no changes required. Patient states former smoker.   Quit in 1980.  1 ppd x 15 yrs.  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  The patient denies shortness of breath at rest, productive cough, non-productive cough, coughing up blood, chest pain, irregular heartbeats, acid heartburn, indigestion, loss of appetite, weight change, abdominal pain, difficulty swallowing, sore throat, tooth/dental problems, headaches, nasal congestion/difficulty breathing through nose, sneezing, itching, ear ache, anxiety, depression, hand/feet swelling, joint stiffness or pain, rash, change in color of mucus, and fever.    Vital Signs:  Patient profile:   65 year old male Height:      67 inches Weight:      158.50 pounds BMI:     24.91 O2 Sat:      99 % on Room air Temp:     98.2 degrees F oral Pulse rate:   76 / minute BP sitting:   96 / 66  (right arm) Cuff size:   regular  Vitals Entered By: Gweneth Dimitri RN (September 22, 2010 1:57 PM)  O2 Flow:  Room air CC: 2 month follow up.  Pt states breathing improved up until Friday.  Having increased SOB since then but relates this to pollen. Denies chest tightness, cough.  Requesting clearance for neck surgery by Dr. Lovell Sheehan @ [facility] Comments Medications reviewed with patient Daytime contact number verified with patient. Gweneth Dimitri RN  September 22, 2010 1:56 PM    Physical Exam  Additional Exam:  Gen: Pleasant, well nourished, in no distress ENT: no lesions, no postnasal drip, mild nasal erythema with nasal purulence Neck: No JVD, no TMG, no carotid bruits Lungs: no use of  accessory muscles, no dullness to percussion,  Cardiovascular: RRR, heart sounds normal, no murmurs or gallops, no peripheral edema Musculoskeletal: No deformities, no cyanosis or clubbing     Pre-Spirometry FEV1    Value: 3.35 L     Impression & Recommendations:  Problem # 1:  ASTHMA (ICD-493.90) Assessment Improved  Moderate persistent asthma with allergic rhinitis improved , spirometry normal  plan: No change in inhaled medications.   Maintain treatment program as currently prescribed. ok for c spine surgery  Medications Added to Medication List This Visit: 1)  Metformin Hcl 500 Mg Tabs (Metformin hcl) .... Take 1 tablet by mouth two times a day  Complete Medication List: 1)  Azor 5-20 Mg Tabs (Amlodipine-olmesartan) .... One by mouth once daily 2)  Dulera 200-5 Mcg/act Aero (Mometasone furo-formoterol fum) .... 2 puffs twice per day 3)  Potassium Citrate 1080 Mg Tbcr (Potassium citrate) .... 2 by mouth two times a day 4)  Albuterol Sulfate (2.5 Mg/64ml) 0.083% Nebu (Albuterol sulfate) .... As needed 5)  Ventolin Hfa 108 (90 Base) Mcg/act Aers (Albuterol sulfate) .Marland Kitchen.. 1-2 puffs every 4-6 hours as needed 6)  Metoprolol Succinate 25 Mg Xr24h-tab (Metoprolol succinate) .Marland Kitchen.. 1 by mouth daily 7)  Metformin Hcl 500 Mg Tabs (Metformin hcl) .... Take 1 tablet by mouth two times a day  Other Orders: Spirometry w/Graph (94010) Est. Patient Level III (04540) Est. Patient Level III (98119)  Patient Instructions: 1)  YOu are cleared for cervical surgery,  I will call Dr Lovell Sheehan 2)  No change in medications 3)  Return in    4      months  Appended Document: Pulmonary OV fax dwight williams ,  jeff jenkins

## 2010-09-30 ENCOUNTER — Ambulatory Visit: Payer: Medicaid Other

## 2010-10-08 ENCOUNTER — Encounter (HOSPITAL_COMMUNITY)
Admission: RE | Admit: 2010-10-08 | Discharge: 2010-10-08 | Disposition: A | Discharge status: Home / Self Care | Payer: Medicaid Other | Source: Ambulatory Visit | Attending: Specialist | Admitting: Specialist

## 2010-10-08 LAB — CBC
Hemoglobin: 14.4 g/dL (ref 13.0–17.0)
MCH: 30.1 pg (ref 26.0–34.0)
MCHC: 34.3 g/dL (ref 30.0–36.0)
MCV: 87.7 fL (ref 78.0–100.0)

## 2010-10-08 LAB — COMPREHENSIVE METABOLIC PANEL
AST: 18 U/L (ref 0–37)
BUN: 21 mg/dL (ref 6–23)
CO2: 26 mEq/L (ref 19–32)
Calcium: 10 mg/dL (ref 8.4–10.5)
Creatinine, Ser: 1.04 mg/dL (ref 0.4–1.5)
GFR calc Af Amer: >60 mL/min (ref >60)
GFR calc non Af Amer: >60 mL/min (ref >60)
Total Bilirubin: 0.7 mg/dL (ref 0.3–1.2)

## 2010-10-08 LAB — SURGICAL PCR SCREEN
MRSA, PCR: POSITIVE — AB
Staphylococcus aureus: POSITIVE — AB

## 2010-10-13 ENCOUNTER — Inpatient Hospital Stay (HOSPITAL_COMMUNITY): Payer: Medicaid Other

## 2010-10-13 ENCOUNTER — Inpatient Hospital Stay (HOSPITAL_COMMUNITY)
Admission: RE | Admit: 2010-10-13 | Discharge: 2010-10-15 | DRG: 472 | Disposition: A | Discharge status: Home Health | Payer: Medicaid Other | Source: Ambulatory Visit | Attending: Neurosurgery | Admitting: Neurosurgery

## 2010-10-13 DIAGNOSIS — I10 Essential (primary) hypertension: Secondary | ICD-10-CM | POA: Diagnosis present

## 2010-10-13 DIAGNOSIS — J45909 Unspecified asthma, uncomplicated: Secondary | ICD-10-CM | POA: Diagnosis present

## 2010-10-13 DIAGNOSIS — E119 Type 2 diabetes mellitus without complications: Secondary | ICD-10-CM | POA: Diagnosis present

## 2010-10-13 DIAGNOSIS — M4712 Other spondylosis with myelopathy, cervical region: Principal | ICD-10-CM | POA: Diagnosis present

## 2010-10-13 DIAGNOSIS — M5000 Cervical disc disorder with myelopathy, unspecified cervical region: Secondary | ICD-10-CM | POA: Diagnosis present

## 2010-10-13 HISTORY — PX: CERVICAL FUSION: SHX112

## 2010-10-13 LAB — GLUCOSE, CAPILLARY: Glucose-Capillary: 99 mg/dL (ref 70–99)

## 2010-10-14 ENCOUNTER — Inpatient Hospital Stay (HOSPITAL_COMMUNITY): Payer: Medicaid Other

## 2010-10-14 LAB — URINALYSIS, ROUTINE W REFLEX MICROSCOPIC
Ketones, ur: 40 mg/dL — AB
Nitrite: NEGATIVE
Protein, ur: NEGATIVE mg/dL
Urobilinogen, UA: 1 mg/dL (ref 0.0–1.0)

## 2010-10-14 LAB — GLUCOSE, CAPILLARY
Glucose-Capillary: 109 mg/dL — ABNORMAL HIGH (ref 70–99)
Glucose-Capillary: 146 mg/dL — ABNORMAL HIGH (ref 70–99)
Glucose-Capillary: 181 mg/dL — ABNORMAL HIGH (ref 70–99)

## 2010-10-14 LAB — CARDIAC PANEL(CRET KIN+CKTOT+MB+TROPI)
Relative Index: 1 (ref 0.0–2.5)
Relative Index: 1.2 (ref 0.0–2.5)
Troponin I: 0.02 ng/mL (ref 0.00–0.06)
Troponin I: 0.01 ng/mL (ref 0.00–0.06)

## 2010-10-15 LAB — CARDIAC PANEL(CRET KIN+CKTOT+MB+TROPI)
CK, MB: 1.5 ng/mL (ref 0.3–4.0)
Relative Index: INVALID (ref 0.0–2.5)
Troponin I: 0.02 ng/mL (ref 0.00–0.06)

## 2010-10-15 LAB — URINE CULTURE
Culture  Setup Time: 201204051937
Culture: NO GROWTH

## 2010-10-15 LAB — GLUCOSE, CAPILLARY: Glucose-Capillary: 182 mg/dL — ABNORMAL HIGH (ref 70–99)

## 2010-10-18 LAB — DIFFERENTIAL
Basophils Absolute: 0 10*3/uL (ref 0.0–0.1)
Basophils Relative: 1 % (ref 0–1)
Eosinophils Absolute: 0.3 K/uL (ref 0.0–0.7)
Eosinophils Relative: 5 % (ref 0–5)
Lymphocytes Relative: 22 % (ref 12–46)
Lymphs Abs: 1.4 K/uL (ref 0.7–4.0)
Monocytes Absolute: 0.5 10*3/uL (ref 0.1–1.0)
Monocytes Relative: 8 % (ref 3–12)
Neutro Abs: 4.4 K/uL (ref 1.7–7.7)
Neutrophils Relative %: 65 % (ref 43–77)

## 2010-10-18 LAB — POCT I-STAT, CHEM 8
BUN: 18 mg/dL (ref 6–23)
Calcium, Ion: 1.1 mmol/L — ABNORMAL LOW (ref 1.12–1.32)
Chloride: 105 mEq/L (ref 96–112)
Creatinine, Ser: 1.2 mg/dL (ref 0.4–1.5)
Glucose, Bld: 140 mg/dL — ABNORMAL HIGH (ref 70–99)
HCT: 43 % (ref 39.0–52.0)
Hemoglobin: 14.6 g/dL (ref 13.0–17.0)
Potassium: 4 meq/L (ref 3.5–5.1)
Sodium: 139 meq/L (ref 135–145)
TCO2: 24 mmol/L (ref 0–100)

## 2010-10-18 LAB — CBC
HCT: 44.8 % (ref 39.0–52.0)
Hemoglobin: 14.6 g/dL (ref 13.0–17.0)
MCHC: 32.6 g/dL (ref 30.0–36.0)
MCV: 90.6 fL (ref 78.0–100.0)
Platelets: 193 10*3/uL (ref 150–400)
RBC: 4.94 MIL/uL (ref 4.22–5.81)
RDW: 13.1 % (ref 11.5–15.5)
WBC: 6.6 10*3/uL (ref 4.0–10.5)

## 2010-10-21 LAB — CULTURE, BLOOD (ROUTINE X 2)
Culture  Setup Time: 201204060025
Culture  Setup Time: 201204060025
Culture: NO GROWTH

## 2010-10-22 NOTE — Op Note (Signed)
NAME:  [name] [name], CHAUSSE NO.:  [id number]  MEDICAL RECORD NO.:  [id number]           PATIENT TYPE:  I  LOCATION:  3103                         FACILITY:  [facility]  PHYSICIAN:  Cristi Loron, M.D.DATE OF BIRTH:  May 21, 1946  DATE OF PROCEDURE:  10/13/2010 DATE OF DISCHARGE:  10/15/2010                              OPERATIVE REPORT   BRIEF HISTORY:  The patient is a 65 year old white male who has suffered from neck and arm pain consistent with a cervical myelopathy.  He has failed medical management and has worked up with a cervical MRI, which demonstrated the patient had a significant spondylosis, stenosis as well as spinal cord signal change at C4-5, C5-6, C6-7.  I have discussed the various treatment options with the patient including surgery, has weighed the risks, benefits, and alternatives of surgery.  He decided to proceed with a C4-5, C5-6, and C6-7 anterior cervical diskectomy, interbody arthrodesis, prosthesis, and anterior cervical plating.  PREOPERATIVE DIAGNOSIS:  C4-5, C5-6, and C6-7 disk degeneration, spondylosis, stenosis, cervical myelopathy/radiculopathy, cervicalgia.  POSTOPERATIVE DIAGNOSIS:  C4-5, C5-6, and C6-7 disk degeneration, spondylosis, stenosis, cervical myelopathy/radiculopathy, cervicalgia.  PROCEDURE:  C4-5, C5-6, C6-7 extensive anterior cervical diskectomy/decompression; C4-5, C5-6, C6-7 anterior interbody arthrodesis with local morselized autograft bone and Actifuse bone graft extender; insertion of interbody prosthesis at C4-5, C5-6, and C6-7; and C4 to C7 anterior cervical plating with titanium plate and screws.  SURGEON:  Cristi Loron, M.D.  ASSISTANT:  Stefani Dama, M.D.  ANESTHESIA:  General endotracheal.  ESTIMATED BLOOD LOSS:  125 mL.  SPECIMENS:  None.  DRAINS:  None.  COMPLICATIONS:  None.  DESCRIPTION OF PROCEDURE:  The patient was brought to the operating room by the anesthesia team.  General  endotracheal anesthesia was induced. The patient remained in supine position.  A roll was placed under shoulders to keep his neck in neutral position.  His anterior cervical region was then prepared with Betadine scrub and Betadine solution. Sterile drapes were applied.  I then injected the area to be incised with Marcaine with epinephrine solution.  I used a scalpel to make a transverse incision at the patient's left anterior neck, then used a Metzenbaum scissors to divide the platysma muscle, and then dissected medially to sternocleidomastoid muscle, jugular vein, and carotid artery.  I carefully dissected down towards the anterior cervical spine identifying the esophagus, retracting it medially.  I then used Kittner swabs to clear soft tissue in the anterior cervical spine and inserted a bent spinal needle into the upper exposed intervertebral disk space.  We then obtained an intraoperative radiogram to confirm our location and we then used electrocautery to detach the medial border of the longus colli muscle bilaterally from the intervertebral disk space at C4-5, C5-6, C6- 7.  We began the decompression at C5-6.  We incised the anterior vertebral disk with 15 blade scalpel.  It was quite spondylotic, we used high- speed drill to drill away some of the anterior spondylosis to gain access into the intervertebral disk space.  We then incised the disk and performed partial diskectomy with a pituitary forceps.  We then inserted distraction  screws at C5 and C6.  I then used high-speed drill to decorticate the vertebral endplates at C5-6 to drill away the remainder of the C5-6 intervertebral disk  to drill away some posterior spondylosis and to thin out the posterior longitudinal ligament.  We then incised the ligament with arachnoid knife and then removed it with the Kerrison punch undercutting the vertebral endplates decompressing the thecal sac.  We then performed foraminotomies about  the bilateral C6 nerve root completing the decompression at this level.  This completed decompression at this level.  We then repeated this procedure in analogous fashion at C4-5 and C6-7 decompressing the thecal sac at these levels as well as the bilateral C5 and C7 nerve roots.  This completed the decompression.  We now turned our attention to arthrodesis.  We used trial spacers to determine the size of the interbody prosthesis.  We then prefilled this prosthesis with combination of a local morselized autograft bone that we obtained during decompression as well as Actifuse bone graft extender. We then inserted the prefilled prosthesis into the distracted interspaces at C4-5, C5-6, and C6-7.  We then removed the distraction screws.  There was a good snug fit of the prosthesis at each level.  We now turned our attention to the anterior spinal instrumentation.  I used high-speed drill to drill away the ventral spondylosis at C4-5, C5- 6, and C6-7, so that plate would lie down flat.  We then selected the appropriate length anterior plate.  We laid it along the anterior aspect of the vertebral bodies from C4 to C7.  We then used a drill to drill two 14 mm holes at C4, C5, C6, and C7.  We then secured the plate to the vertebral bodies by placing two 14-mm self-tapping screws at C4, C5, C6, and C7.  We got good bony purchase.  We then obtained intraoperative radiograph, which demonstrated good position of the upper plate and screws.  There was limited visualization of the lower plate and screws because of the patient's shoulders, but it looked good in vivo.  We therefore secured this region by placing a cap.  We then obtained hemostasis using bipolar electrocautery.  We irrigated the wound out with bacitracin solution.  We then removed the retractor, inspected the esophagus for any damage, there was none apparent.  We then placed a 10-mm flat Jackson-Pratt drain in the prevertebral  space. We tunneled out through separate stab wound.  We then secured the drain at the exit site with 3-0 nylon suture.  We then reapproximated the patient's platysma muscle with interrupted 3-0 Vicryl suture, subcutaneous tissue with interrupted 3-0 Vicryl suture, and the skin with Steri-Strips and Benzoin.  The wound was then coated with bacitracin ointment.  Sterile dressing was applied.  The drapes were removed.  The patient was subsequently extubated by the anesthesia team and transported to the post anesthesia care unit in stable condition. All sponge, instrument, and needle counts were correct at the end of this case.    Cristi Loron, M.D.    JDJ/MEDQ  D:  10/15/2010  T:  10/16/2010  Job:  [id number]  Electronically Signed by Tressie Stalker M.D. on 10/22/2010 08:45:42 AM

## 2010-11-23 NOTE — Assessment & Plan Note (Signed)
[facility]                             PULMONARY OFFICE NOTE   NAME:[name] [name], [name] [name]                     MRN:          [id number]  DATE:02/12/2007                            DOB:          1946-04-09    [name] returns in followup and is a 65 year old white male with  history of asthmatic bronchitis. He is improved, less short of breath,  less cough. He has finished his course of prednisone. He is now on:  1. Symbicort 2 sprays b.i.d. 160/4.5.  2. Nasonex 2 sprays each nostril b.i.d.  3. P.r.n. albuterol.   On exam, temperature 97, blood pressure 110/80, pulse 87, saturation 96%  room air.  CHEST: Showed distant breath sounds. No wheeze or rhonchi.  CARDIAC EXAM: Showed a regular rate and rhythm without S3. Normal S1,  S2.  ABDOMEN: Soft, nontender.  EXTREMITIES: Showed no edema, clubbing, or venous disease.  SKIN: Clear.  NEUROLOGIC EXAM: Intact.  HEENT EXAM: Showed no jugular venous distension. No lymphadenopathy.   IMPRESSION:  Asthmatic bronchitis with stable airway function.   PLAN:  For the patient to maintain inhaled Symbicort and Nasonex as is.  We will see the patient back in return follow up.     Charlcie Cradle Delford Field, MD, [facility]  Electronically Signed    PEW/MedQ  DD: 02/12/2007  DT: 02/12/2007  Job #: [id number]   cc:   Arrie Aran, M.D.

## 2010-11-23 NOTE — Assessment & Plan Note (Signed)
[facility]                             PULMONARY OFFICE NOTE   NAME:[name], [name] STARY                     MRN:          [id number]  DATE:01/18/2007                            DOB:          [birth year]    PULMONARY OFFICE NOTE   [name] [name] is a 65 year old white male whom I last saw in the year 2002.  Subsequent to that, Dr. Sung Amabile assumed care as well between 2002 to  2004.  The patient has an established diagnosis of moderate persistent  asthma, reflux disease was thought to be playing a role.  He was  maintained on Advair and p.r.n. albuterol and was stable through 2004,  but more recently has had increased difficulty with shortness of breath,  missing work, was laid off a year ago.  He says he has got nocturnal  symptoms, sometimes daytime symptoms, not really well controlled.  Prednisone helps.  Unsure if Advair has been helpful.  Albuterol helps  to some degree.  Dr. Sung Amabile saw him for the last visit to schedule PFTs  and chest x-ray, and wished for me to follow him up because of Dr.  Sung Amabile' decreased office availability.   MEDICATIONS:  1. He maintains the Advair 250/50 one spray b.i.d.  2. He is off Nexium.  3. He is on the Azor 5/20 daily.  4. Hydrochlorothiazide daily.  5. Nasonex b.i.d. by nasal spray.   EXAM:  Temperature is 97.7, blood pressure 112/70, pulse 109, saturation  96% on room air.  CHEST:  Showed distant breath sounds without evidence of wheeze or  rhonchi.  CARDIAC:  Irregular rate and rhythm without S3.  Normal S1, S2.  ABDOMEN:  Soft.  Nontender.  EXTREMITIES:  No edema or clubbing.  SKIN:  Clear.  NEUROLOGIC:  Intact.   Pulmonary functions were obtained today and reviewed, and revealed an  FEV1 of 2.99, FEVC 4.30, FEV1:FEV ratio 70%.  Total lung capacity at  101% of predicted.  Diffusion capacity at 79% predicted.  FEF25-75 was  low at 65% of predicted.  Chest x-ray showed no active disease.   IMPRESSION:   Moderate persistent asthma, allergic rhinitis flare.   PLAN:  To switch from Advair to Symbicort utilizing Aerochamber for more  effective distal airway distribution.  The 160/4.5 Symbicort was chosen  2 sprays b.i.d. will be administered.  The patient was instructed as to its proper use.  Pulse prednisone will  be continued.  The Azor will also be continued and we will see the  patient back in return followup in 6 weeks.     Charlcie Cradle Delford Field, MD, [facility]  Electronically Signed    PEW/MedQ  DD: 01/18/2007  DT: 01/18/2007  Job #: [id number]   cc:   Lacretia Leigh. Quintella Reichert, M.D.

## 2010-11-23 NOTE — Op Note (Signed)
NAME:  TAHJ, NJOKU NO.:  [id number]   MEDICAL RECORD NO.:  [id number]          PATIENT TYPE:  AMB   LOCATION:  DAY                          FACILITY:  [facility]   PHYSICIAN:  Martina Sinner, MD DATE OF BIRTH:  [birth year]   DATE OF PROCEDURE:  04/11/2007  DATE OF DISCHARGE:  04/11/2007                               OPERATIVE REPORT   PREOPERATIVE DIAGNOSIS:  Right ureteral stones.   POSTOPERATIVE DIAGNOSIS:  Right ureteral stones.   SURGERY:  Right retrograde ureterogram.   The details of Mr. Casten's operation was dictated in the operative  note.  He underwent a right urethrogram.  An open-end ureteral catheter  that was 6-French was easily placed fluoroscopically and cystoscopically  into the distal right ureter.  I slowly injected dye.  He had a  nondilated ureter to a 6-mm stone that looked to be approximately at L4  spinous process.  A small trickle of dye passed by the stone.  At the  end of the case, first I took hard copies, and fluoroscopy documented  that the stent was curled in the bladder as well as in the renal pelvis.           ______________________________  Martina Sinner, MD  Electronically Signed     SAM/MEDQ  D:  04/11/2007  T:  04/12/2007  Job:  [id number]

## 2010-11-23 NOTE — Assessment & Plan Note (Signed)
[facility]                             PULMONARY OFFICE NOTE   NAME:[name] [name] JAQUAY                     MRN:          [id number]  DATE:04/18/2007                            DOB:          1946/06/29    HISTORY:  [name] [name] returns in followup.  He is a 65 year old white  male with moderate persistent asthma and allergic rhinitis, still having  dyspnea, worse for the last two to three days, having increased cough  with mucus production of thick yellow mucus.  He maintains Symbicort two  sprays twice daily.   PHYSICAL EXAMINATION:  VITAL SIGNS:  Temperature 97 degrees, blood  pressure 110/80, pulse 97, saturation 96% on room air.  CHEST:  Diminished breath sounds with prolonged expiratory phase.  No  wheeze or rhonchi noted.  HEART:  A regular rate and rhythm without S3.  Normal S1 and S2.  ABDOMEN:  Soft, nontender.  EXTREMITIES:  Showed no edema or clubbing.  SKIN:  Clear.   IMPRESSION:  Acute bronchitis.   PLAN:  For the patient to receive Avelox 400 mg daily for a five-day  course, pulse prednisone 40 mg daily throughout the taper.   FOLLOWUP:  To return to this office in three months.     Charlcie Cradle Delford Field, MD, [facility]  Electronically Signed    PEW/MedQ  DD: 04/18/2007  DT: 04/18/2007  Job #: [id number]   cc:   Lacretia Leigh. Quintella Reichert, M.D.

## 2010-11-23 NOTE — Op Note (Signed)
NAME:  [name] [name], [name] [name] NO.:  [id number]   MEDICAL RECORD NO.:  [id number]          PATIENT TYPE:  AMB   LOCATION:  DAY                          [facility]   PHYSICIAN:  Martina Sinner, MD DATE OF BIRTH:  Nov 25, 1945   DATE OF PROCEDURE:  DATE OF DISCHARGE:  04/11/2007                               OPERATIVE REPORT   PREOPERATIVE DIAGNOSES:  Right ureteral stone with hydronephrosis and  elevated creatinine.   POSTOPERATIVE DIAGNOSES:  Right ureteral stone with hydronephrosis and  elevated creatinine.   SURGERY:  Cystoscopy, right retrograde urethrogram and insertion of  right ureteral stent.   [name] has an elevated creatinine.  He has right colic.  He has a  long history of stone disease.  He has a 6-mm stone in the mid right  ureter, based upon fluoroscopy near the L4 spinous process.   The patient was prepped and draped in the usual fashion.  Fluoroscopically I could see a shadow near the L3 or 4 spinous process.  Cystoscopic examination was within normal limits.  The trigone was easy  to identify. The ureteral orifices were normal.  Through a large right  ureteral orifice, I gently placed fluoroscopically and cystoscopically  an open-ended 6-French ureteral catheter to the high pelvic ureter.  I  gently did a retrograde urethrogram.  Almost no dye went past the stone.  There was an hour-glass deformity.  There was a little trickle or streak  of dye in my opinion passing.  I did not want to inject under pressure,  so I used a sensor wire.  The sensor wire nicely when up and curled into  the right renal pelvis at the level of L1 and L2.  I could not pass the  ureteral catheter past the stone over the wire.  I did not want to cause  a perforation or injury.   I therefore removed the open-end ureteral catheter.  I used a 6-French  x 26 cm well-lubricated double-J stent.  With a little bit of direct  pressure, the stent did pass past the stone  under fluoroscopic and  cystoscopic guidance into the renal pelvis.  I removed the wire and  there was a nice curl of the pigtail in the kidney as well as in the  bladder.  The bladder was emptied.  The patient was taken to the  recovery room.  The patient was given preoperative antibiotics.   The patient was discharged home with home medications and will have  lithotripsy on Monday.           ______________________________  Martina Sinner, MD  Electronically Signed     SAM/MEDQ  D:  04/11/2007  T:  04/12/2007  Job:  [id number]

## 2010-11-23 NOTE — Assessment & Plan Note (Signed)
[facility]                             PULMONARY OFFICE NOTE   NAME:[name] [name], ARNO CULLERS                     MRN:          [id number]  DATE:12/18/2006                            DOB:          [birth year]    PRIMARY PHYSICIAN:  Dr. Feliciana Rossetti   HISTORY OF PRESENT ILLNESS:  Mr. Bonn is a 65 year old gentleman who  has been seen in the past by both Dr. Delford Field and myself for what was  labeled as asthma.  He was last seen in our office on March 13, 2003.  We had also hypothesized that he might suffer from gastroesophageal  reflux disease. His medical management included Advair and Nexium.  He  was lost to followup with our office but has been managed by Dr. Quintella Reichert  in the interim.  He states that he keeps prednisone at home and has to  take a 5 day course approximately monthly for exacerbations of his  symptoms.  He has had increasing symptoms over the past 1 to 2 years and  missed so much work that he was ultimately laid off of work  approximately 1 year ago. He has never been hospitalized.  His last  emergency room visit was a long time ago, such that he cannot remember  exactly when.  He does have nocturnal symptoms and sometimes symptoms  during the daytime.  He has never felt that it was fully controlled.  He  does believe that the prednisone helps but it takes 2 to 3 days to kick-  in.  Presently he is on Advair but he is unsure of whether this has  been beneficial since he has not stopped that medicine in many years.  He does describe day to day variation in his symptoms and when he has  symptoms gets minimal relief from albuterol.  His symptoms are not  seasonal.  They are associated with chest tightness which is mild, but  denies any heartburn symptoms.  He does have intermittent cough which is  productive of scant mucus and denies hemoptysis.  He has no exertional  chest pain, no nausea, vomiting, diarrhea, or dysuria.  No lower  extremity  edema or calf tenderness.   PAST MEDICAL HISTORY:  He has had 2 knee surgeries, a surgery for a  kidney stone and surgery on the deviated septum.  He is also treated for  hypertension.   SOCIAL HISTORY:  He smoked less than a pack of cigarettes per day for 20  years and quit in 1980.  He has no history of significant occupational  exposures.   FAMILY HISTORY:  This has been reviewed and is noncontributory.   REVIEW OF SYSTEMS:  This is as per the history of present illness and is  otherwise negative.   PHYSICAL EXAMINATION:  GENERAL:  He is a well-developed, well-nourished,  in no acute  cardiac or respiratory stress.  Temperature is 98.2.  Blood pressure 142/78, pulse 97.  Respirations 16,  room air oxygen saturation 97%.  He is in no acute cardiac or respiratory distress, though he does have a  somewhat congested  quality to his voice.  HEENT:  Reveals tympanic canals to be patent and tympanic membranes are  normal.  There is thin mucoid secretions in both nares but his nares  appear patent as well. Oropharynx is benign.  NECK:  Is supple without adenopathy or jugular venous distention.  CHEST:  Reveals a normal percussion, note throughout breath sounds are  full without wheezes or other adventitious sounds.  CARDIAC:  Reveals a regular rate and rhythm with no murmurs.  ABDOMEN:  Is soft, nontender with normal bowel sounds.  EXTREMITIES:  Are without clubbing, cyanosis or edema.  NEUROLOGIC:  Without focal deficits.   DATA:  Spirometries performed between the years of 2001 and 2004, all  appear to be relatively normal with the exception of 1 spirometry  performed in August 2004 where the flow-volume curve had a truncated  appearance which I interpreted as being consistent with upper airway  obstruction.   IMPRESSION:  1. Episodic dyspnea.  2. Diagnosis of asthma - many features of his illness would certainly      be consistent with asthma, though there are some aspects of  his      case that are less consistent.  In particular, his pulmonary      function tests are essentially normal, even at times that he has      had symptoms in the past.  In addition, he appears to have mild      asthma overall but extreme difficulty with control despite apparent      compliance with Advair which should control the vast majority of      people with asthma.  Likewise, he has minimal benefit from      albuterol.  Other contributing factors might be vocal cord      dysfunction syndrome, rhinosinusitis and gastroesophageal reflux      disease.   PLAN:  He has been followed in the past by Dr. Delford Field who has more time  in the office to follow through on this, so I will arrange for followup  with Dr. Delford Field in 2 to 4  weeks.  In the meantime, I will order full  pulmonary function tests and a chest x-ray upon follow up.  I did  discuss the possibility of adding Singulair to his current asthma  regimen.  He reports to me that he has had Singulair in the past without  any benefit and was concerned that it caused easy bruisability.  Therefore I have made no changes in his medical regimen at this time.     Oley Balm Sung Amabile, MD  Electronically Signed    DBS/MedQ  DD: 12/18/2006  DT: 12/18/2006  Job #: [id number]   cc:   Lacretia Leigh. Quintella Reichert, M.D.  Charlcie Cradle Delford Field, MD, FCCP

## 2010-11-25 ENCOUNTER — Ambulatory Visit: Payer: Medicaid Other

## 2010-12-02 ENCOUNTER — Ambulatory Visit: Payer: Medicaid Other

## 2010-12-10 ENCOUNTER — Other Ambulatory Visit: Payer: Self-pay | Admitting: Critical Care Medicine

## 2010-12-22 ENCOUNTER — Other Ambulatory Visit: Payer: Self-pay | Admitting: *Deleted

## 2010-12-22 MED ORDER — MOMETASONE FURO-FORMOTEROL FUM 200-5 MCG/ACT IN AERO: 2.0000 | INHALATION_SPRAY | Freq: Two times a day (BID) | RESPIRATORY_TRACT | Status: DC

## 2011-01-20 ENCOUNTER — Telehealth: Payer: Self-pay | Admitting: Critical Care Medicine

## 2011-01-20 ENCOUNTER — Encounter: Payer: Self-pay | Admitting: Critical Care Medicine

## 2011-01-20 ENCOUNTER — Encounter: Payer: Medicaid Other | Attending: Specialist

## 2011-01-20 DIAGNOSIS — Z713 Dietary counseling and surveillance: Secondary | ICD-10-CM | POA: Insufficient documentation

## 2011-01-20 DIAGNOSIS — E119 Type 2 diabetes mellitus without complications: Secondary | ICD-10-CM | POA: Insufficient documentation

## 2011-01-20 NOTE — Progress Notes (Signed)
  Patient was seen on 01/20/2011 for the second of a series of three diabetes self-management courses at the [facility]. The following learning objectives were met by the patient during this course:   States the relationship of exercise to blood glucose  States benefits/barriers of regular and safe exercise  States three guidelines for safe and effective exercise  Describes personal diabetes medicine regimen  Describes actions of own medications  Describes causes, symptoms, and treatment of hypo/hyperglycemia  Describes sick day rules  Identifies when to test urine for ketones when appropriate  Identifies when to call healthcare provider for acute complications  States the risk for problems with foot, skin, and dental care  States preventative foot, skin, and dental care measures  States when to call healthcare provider regarding foot, skin, and dental care  Identifies methods for evaluation of diabetes control  Discusses benefits of SBGM  Identifies relationship between nutrition, exercise, medication, and glucose levels  Discusses the importance of record keeping  Educational Materials: [facility] Core Program Notebook  Follow-Up Plan: Patient will attend the final class of the ADA Diabetes Self-Care Education.

## 2011-01-20 NOTE — Telephone Encounter (Signed)
[employer] APPROVED through [employer] at [phone] until 01/20/2012.  Member ID # is [id number].  [facility] notified and they will notify the patient.

## 2011-01-21 ENCOUNTER — Ambulatory Visit (INDEPENDENT_AMBULATORY_CARE_PROVIDER_SITE_OTHER): Payer: Medicaid Other | Admitting: Critical Care Medicine

## 2011-01-21 ENCOUNTER — Encounter: Payer: Self-pay | Admitting: Critical Care Medicine

## 2011-01-21 DIAGNOSIS — J4550 Severe persistent asthma, uncomplicated: Secondary | ICD-10-CM

## 2011-01-21 DIAGNOSIS — J45909 Unspecified asthma, uncomplicated: Secondary | ICD-10-CM

## 2011-01-21 NOTE — Patient Instructions (Addendum)
No change in medications. Return in      4 months  Your [name] [name] is approved, at [employer]

## 2011-01-21 NOTE — Progress Notes (Signed)
Subjective:     [name] [name] is an 65 y.o. male who presents for follow up of asthma. The patient is not currently have symptoms / an exacerbation. The patient has been having episodes for approximately 2 days. Symptoms in previous episodes have included dyspnea, non-productive cough and wheezing, and typically last 2 minutes. Previous episodes have been triggered by no identifiable factor. Treatments tried during prior episodes include inhaled corticosteroids and short-acting inhaled beta-adrenergic agonists, which usually provides some relief of symptoms.   Current Disease Severity [name] [name] has frequent daytime asthma symptoms. He has no nighttime asthma symptoms. The patient is using short-acting beta agonists for symptom control less than or equal to 2 days per week. He has exacerbations requiring oral systemic corticosteroids 2 times per year. Current limitations in activity from asthma: has some limitations. Number of days of school or work missed in the last month: not applicable. Number of urgent/emergent visit in last year: 0       Review of Systems Pertinent items are noted in HPI.    Objective:    Oxygen saturation 99% on room air BP 96/62  Pulse 92  Temp(Src) 97.8 F (36.6 C) (Oral)  Ht 5\' 7"  (1.702 m)  Wt 148 lb 9.6 oz (67.405 kg)  BMI 23.27 kg/m2  SpO2 97%  General Appearance:    Alert, cooperative, no distress, appears stated age  Head:    Normocephalic, without obvious abnormality, atraumatic  Eyes:    PERRL, conjunctiva/corneas clear, EOM's intact, fundi    benign, both eyes       Ears:    Normal TM's and external ear canals, both ears  Nose:   Nares normal, septum midline, mucosa normal, no drainage    or sinus tenderness  Throat:   Lips, mucosa, and tongue normal; teeth and gums normal  Neck:   Supple, symmetrical, trachea midline, no adenopathy;       thyroid:  No enlargement/tenderness/nodules; no carotid   bruit or JVD  Back:     Symmetric, no curvature,  ROM normal, no CVA tenderness  Lungs:     Clear to auscultation bilaterally, respirations unlabored  Chest wall:    No tenderness or deformity  Heart:    Regular rate and rhythm, S1 and S2 normal, no murmur, rub   or gallop  Abdomen:     Soft, non-tender, bowel sounds active all four quadrants,    no masses, no organomegaly  Genitalia:    Normal male without lesion, discharge or tenderness  Rectal:    Normal tone, normal prostate, no masses or tenderness;   guaiac negative stool  Extremities:   Extremities normal, atraumatic, no cyanosis or edema  Pulses:   2+ and symmetric all extremities  Skin:   Skin color, texture, turgor normal, no rashes or lesions  Lymph nodes:   Cervical, supraclavicular, and axillary nodes normal  Neurologic:   CNII-XII intact. Normal strength, sensation and reflexes      throughout      Assessment:    Severe persistent asthma, unchanged.     Plan:       ASTHMA Severe persistent asthma d/t atopic features  Plan Cont ics/laba Refill sent to [facility] and approved [facility]    Updated Medication List Outpatient Encounter Prescriptions as of 01/21/2011  Medication Sig Dispense Refill  . albuterol (PROVENTIL) (2.5 MG/3ML) 0.083% nebulizer solution Take 2.5 mg by nebulization as needed.        Marland Kitchen amLODipine-olmesartan (AZOR) 5-20 MG per tablet Take 1  tablet by mouth daily.        . cyclobenzaprine (FLEXERIL) 10 MG tablet Take 10 mg by mouth 3 (three) times daily as needed.        . metFORMIN (GLUCOPHAGE) 500 MG tablet Take 500 mg by mouth 2 (two) times daily.        . metoprolol succinate (TOPROL-XL) 25 MG 24 hr tablet Take 25 mg by mouth daily.        . Mometasone Furo-Formoterol Fum (DULERA) 200-5 MCG/ACT AERO Inhale 2 puffs into the lungs 2 (two) times daily.  13 g  5  . potassium citrate (UROCIT-K) 10 MEQ (1080 MG) SR tablet Take 20 mEq by mouth 2 (two) times daily.        Marland Kitchen albuterol (VENTOLIN HFA) 108 (90 BASE) MCG/ACT inhaler 1-2 puffs every 4-6 hours  as needed

## 2011-01-22 NOTE — Assessment & Plan Note (Signed)
Severe persistent asthma d/t atopic features  Plan Cont ics/laba Refill sent to [facility] and approved [facility]

## 2011-01-31 NOTE — Progress Notes (Signed)
  Patient was seen on 01/27/2011 for the third of a series of three diabetes self-management courses at the [facility]. The following learning objectives were met by the patient during this course:   Identifies nutrient effects on glycemia  States the general guidelines of meal planning  Relates understanding of personal meal plan  Describes situations that cause stress and discuss methods of stress management  Identifies lifestyle behaviors for change  Your patient has established the following 3 month goals for diabetes self-care:  Make an appointment with my ophthalmologist for a dilated eye exam.  Improve diet choices.   Increase my exercise.  Follow-Up Plan: Patient will attend a 3 month follow-up visit for diabetes self-management education.

## 2011-03-02 ENCOUNTER — Ambulatory Visit: Payer: Medicaid Other | Attending: Neurosurgery | Admitting: Rehabilitation

## 2011-03-02 DIAGNOSIS — M542 Cervicalgia: Secondary | ICD-10-CM | POA: Insufficient documentation

## 2011-03-02 DIAGNOSIS — M545 Low back pain, unspecified: Secondary | ICD-10-CM | POA: Insufficient documentation

## 2011-03-02 DIAGNOSIS — M6281 Muscle weakness (generalized): Secondary | ICD-10-CM | POA: Insufficient documentation

## 2011-03-02 DIAGNOSIS — IMO0001 Reserved for inherently not codable concepts without codable children: Secondary | ICD-10-CM | POA: Insufficient documentation

## 2011-03-07 ENCOUNTER — Ambulatory Visit: Payer: Medicaid Other | Admitting: Physical Therapy

## 2011-03-18 ENCOUNTER — Ambulatory Visit: Payer: Medicaid Other | Attending: Neurosurgery | Admitting: Physical Therapy

## 2011-03-18 DIAGNOSIS — M542 Cervicalgia: Secondary | ICD-10-CM | POA: Insufficient documentation

## 2011-03-18 DIAGNOSIS — IMO0001 Reserved for inherently not codable concepts without codable children: Secondary | ICD-10-CM | POA: Insufficient documentation

## 2011-03-18 DIAGNOSIS — M545 Low back pain, unspecified: Secondary | ICD-10-CM | POA: Insufficient documentation

## 2011-03-18 DIAGNOSIS — M6281 Muscle weakness (generalized): Secondary | ICD-10-CM | POA: Insufficient documentation

## 2011-03-23 ENCOUNTER — Ambulatory Visit: Payer: Medicaid Other | Admitting: Physical Therapy

## 2011-04-04 LAB — BASIC METABOLIC PANEL
GFR calc non Af Amer: >60
Glucose, Bld: 101 — ABNORMAL HIGH
Potassium: 4.3
Sodium: 137

## 2011-04-19 LAB — BASIC METABOLIC PANEL
Chloride: 106
GFR calc Af Amer: >60
Potassium: 3.6

## 2011-04-21 LAB — BASIC METABOLIC PANEL
CO2: 28
Chloride: 103
Chloride: 106
GFR calc Af Amer: 22 — ABNORMAL LOW
GFR calc Af Amer: 59 — ABNORMAL LOW
Potassium: 3.4 — ABNORMAL LOW
Potassium: 4.4
Sodium: 143

## 2011-04-21 LAB — HEMOGLOBIN AND HEMATOCRIT, BLOOD: HCT: 44.4

## 2011-04-25 ENCOUNTER — Encounter: Payer: Medicaid Other | Attending: Specialist | Admitting: Dietician

## 2011-04-25 NOTE — Progress Notes (Signed)
1 Patient was seen on 04/25/2011 for their 3 month follow-up as a part of the diabetes self-management courses at the [facility]. The following learning objectives were met by your patient during this course:  Please see Diabetes Flow sheet for findings related to patient's self-care.  Patient self reports the following:  Diabetes control has improved since diabetes self-management training: yes Number of days blood glucose is >200: None Last MD appointment for diabetes: Sept. 17, 2012 Changes in treatment plan: MD wants him to stop loosing weight and to gain some weight back Confidence with ability to manage diabetes: Feels better able to control blood glucose Areas for improvement with diabetes self-care: Keeping a more consistent carb/calorie level and eating about the same amount of food each day. Willingness to participate in diabetes support group: Will address this at his follow-up in December.   Follow-Up Plan: Patient is eligible for a "free" 30 minute diabetes self-care appointment in the next year. Patient to call and schedule as needed.  [name] [name] has experienced an 18 lb weight loss since he began his classes at [facility].  His current BMI is at 21.9%.  His MD has advised him to eat more and to gain some weight.  I encouraged him to gain back to 145-148 lb.  His recall of current intake is at times 1100-1200 calories per day. I had recommended 1600 calories per day.  We reviewed his intake, carb counting and label reading.  I ask that he keep a food diary and aim for 1600-1800 calories per day and 225 gm of carbohydrate each day as well.  He is to have a free follow-up visit for 30 minutes in December.

## 2011-06-20 ENCOUNTER — Encounter: Payer: Medicaid Other | Attending: Specialist | Admitting: Dietician

## 2011-06-20 ENCOUNTER — Encounter: Payer: Self-pay | Admitting: Dietician

## 2011-06-20 NOTE — Patient Instructions (Addendum)
   Cinnamon Waffle  Carb (22g), water, banana of (20-30 gm) add peanut better on either the waffle or banana.  PB is protein.  Use PB that is lower in carb.  (4 net carbs)  Use the smaller banana with the Jiff PB  Egg sandwich using the sandwich thins (17 net carbs per sandwich) , use two whites and 1 yolk and the cheese 1-2 slices. Ads a piece of fruit for breakfast.  Scrambled eggs, try the two whites and 1 yolk and add cheese along with the lean bacon and sausage.  Lunch:  All lunches add fruit or some non-starchy veggies.  At dinner, aim for getting closer to closer to the 45-60 gm of carbohydrate.  Aim for more lean protein.  Use your carb counting book.  Add more of the beans ( pinto, black, lima, black eye peas, white or navy,   Try to get back to [facility] before the visit with Dr. Mayford Knife for the A1C.    Keep weighing yourself at home.  If Medicaid give you an issue with the number of strips you are using, then alternate checking after lunch and dinner.  Bring your glucose for the next 3 months to your next appointment (early-mid March)

## 2011-06-20 NOTE — Progress Notes (Signed)
  Patient was seen on 06/20/2011 for his 4-5 month follow-up as a part of the diabetes self-management courses at the [facility]. The following learning objectives were met by your patient during this course:  Please see Diabetes Flow sheet for findings related to patient's self-care.  Patient self reports the following:  Diabetes control has improved since diabetes self-management training: yes Number of days blood glucose is >200: None Last MD appointment for diabetes: 06/13/2011 Changes in treatment plan: Diabetes treatment plan unchanged. Confidence with ability to manage diabetes: Feels confident Areas for improvement with diabetes self-care: Continue to try to stabilize weight at 145-148 lb. Willingness to participate in diabetes support group: Prefers to not attend the evening sessions. Notes that his MD wants him to gain weight and to keep his A1C at 5.7%.  With the increased nutrient intake he is having higher blood glucose levels and his A1C was 6.1% on Dec. 3, 2012.  His meal recall reveals a breakfast of 22-44-52 gm of carbohydrate, Lunch is 22 gm to 52 gm of carb, 20-30 most days, Snacks are 15-22 gm for AM and Mid-Afternoon and HS.  Dinner is 22-44 gm of carb per recall. He continues to exercise daily and to monitor his blood glucose 3-4 times per day.  Follow-Up Plan: Will continue to count carbs and aim for getting them in the 45-60 gm for the breakfast and lunch meals.  Will address the process using the Patient Instructions/suggestions.  He is to call for a follow-up appointment in mid-March prior to his MD appointment.

## 2011-09-26 ENCOUNTER — Encounter: Payer: Medicaid Other | Attending: Specialist | Admitting: Dietician

## 2011-09-26 VITALS — Ht 67.0 in | Wt 145.1 lb

## 2011-09-26 DIAGNOSIS — Z713 Dietary counseling and surveillance: Secondary | ICD-10-CM | POA: Insufficient documentation

## 2011-09-26 DIAGNOSIS — E119 Type 2 diabetes mellitus without complications: Secondary | ICD-10-CM

## 2011-09-26 NOTE — Patient Instructions (Addendum)
   Mid-morning snack or early lunch plan to have protein with all meals and snacks.  Think about a handful of nuts with the afternoon snack and bedtime snack.  Need to keep your weight at 145-150 lb.  Weight daily.  When picking your activities, such as yard plus walking, have more carb at the lunch and at the afternoon snack.  Have protein serving at both these.  You will be checking your weight at home if not daily, 3 times per week   Keep the weight closer to 150 lbs.  Try for more at snack time over the summer months.  With hot weather and working outside, 64 oz of fluid per day.

## 2011-09-26 NOTE — Progress Notes (Signed)
  Medical Nutrition Therapy:  Appt start time: 1500 end time:  1530.  Assessment:  Primary concerns today: Concerns involve his issue with maintaining his weight.  He wishes to maintain his current lower glucose levels and A1C and feels if he starts to eat more and gain a little weight, his glucose levels will increase.  We discussed the use of protein and good fats as a source of calories but not carbohydrates that would help with weight gain but not add carb to the diet.  Recently, had a "bug that took my appetite, but I feel better now."  Notes that his pulmonary MD is pleased with his weight loss and his blood glucose levels.   MEDICATIONS: Metformin 500 mg AM and PM.  DIETARY INTAKE:  24-hr recall:  B (4:00 AM): banana 1, (25-30 gm CHO) or orange, 1 cinnamon waffle, plain; or some chips or puffed corn a small bag (47 pieces.) Snk (9:30 AM) :Lunch:  Sandwich rounds, (ww) with sliced ham, cheese, mustard OR a [place] sandwich or leftover hamburger. +/- diet drink.  12:30-1:00 Snk orange or vanilla waffers,  (5:00-5:30 PM): Dinner:  Brett Albino, tombstone 12 inch, ate 1/2 and did not eat the end crust away, Pepperoni and sausage. And water. Snk (bedtime  PM): sugar free pudding one container. Beverages: water, diet soda occasionally, some coffee at times.  Feels the white bread is making his glucose going high after dinner.  It was the only different food he was eating, when he tried the experiment using the white bread.   Recent physical activity: MD states he needs to sleep more.  Walking 1/4 mile with dog for 15-20 minutes daily.  Keep this going and you Wiley Flicker have to go to the mall or the big box store ([employer] or [employer])  When the weather/pollen is worse.  Estimated energy needs:Ht: 67 in  WT:  145.1 lb (increase of 2.8 lb since Dec.)  BMI: 22.8 %   1600-1800 calories (to keep weight at the 145-150 lbs., a weight that will give a little cushion should he have some illness).   175-180 g  carbohydrates 125-130 g protein 45-47 g fat  Progress Towards Goal(s):  In progress.   Nutritional Diagnosis:  [place]-2.1 Inpaired nutrition utilization As related to glucose.  As evidenced by diagnosis of type 2 diabetes with the use of metformin and random blood glucose elevations..    Intervention:  Nutrition Continue to read labels, monitor portions.  Try to incorporate a protein source at all meals and snacks.  Protein is used to provide for growth and repair of tissues, especially immune cells.  On occasion use another serving of the good (monounsaturated) fats.  This adds calories and in smaller amounts, will not elevate blood glucose levels.  Handouts given during visit include:  Snack list with carb and protein suggestions  Monitoring/Evaluation:  Dietary intake, exercise, blood glucose levels, and body weight in August or September.  He is to call for follow-up appointment.

## 2011-09-27 ENCOUNTER — Encounter: Payer: Self-pay | Admitting: Dietician

## 2012-07-25 ENCOUNTER — Encounter (HOSPITAL_COMMUNITY): Payer: Self-pay | Admitting: *Deleted

## 2012-07-25 ENCOUNTER — Other Ambulatory Visit: Payer: Self-pay | Admitting: Urology

## 2012-07-25 NOTE — Pre-Procedure Instructions (Addendum)
Asked to bring blue folder the day of the procedure,insurance card,I.D. driver's license,wear comfortable clothing and have a driver for the day. Asked not to take Advil,Motrin,Ibuprofen,Aleve or any NSAIDS, Aspirin, or Toradol for 72 hours prior to procedure,  No vitamins or herbal medications 7 days prior to procedure. Instructed to take laxative per doctor's office instructions and eat a light dinner the evening before procedure.   To arrive at 0800 for lithotripsy procedure.  Worried about his blood sugar dropping he is going to check with his medical doctor to see if he wants him to do anything different.  Plans to eat a snack before going to bed.  Will call in to Short Stay that morning if he has a problem with a low blood sugar. Needs an EKG history of tachycardia plans to go to his family doctor and have it faxed to PST tomorrow or by Friday. 07-26-2012 Received EKG from [facility]. Mayford Knife, M.D. Office medical doctor for Mr. Granieri.

## 2012-07-30 ENCOUNTER — Encounter (HOSPITAL_COMMUNITY): Payer: Self-pay | Admitting: *Deleted

## 2012-07-30 ENCOUNTER — Encounter (HOSPITAL_COMMUNITY): Admission: RE | Payer: Self-pay | Source: Ambulatory Visit

## 2012-07-30 ENCOUNTER — Ambulatory Visit (HOSPITAL_COMMUNITY): Admission: RE | Admit: 2012-07-30 | Payer: Medicaid Other | Source: Ambulatory Visit | Admitting: Urology

## 2012-07-30 HISTORY — DX: Cardiac arrhythmia, unspecified: I49.9

## 2012-07-30 HISTORY — DX: Chronic kidney disease, unspecified: N18.9

## 2012-07-30 HISTORY — DX: Essential (primary) hypertension: I10

## 2012-07-30 SURGERY — LITHOTRIPSY, ESWL
Anesthesia: LOCAL | Laterality: Left

## 2012-07-30 NOTE — Pre-Procedure Instructions (Addendum)
Asked to bring blue folder the day of the procedure,insurance card,I.D. driver's license,wear comfortable clothing and have a driver for the day. Asked not to take Advil,Motrin,Ibuprofen,Aleve or any NSAIDS, Aspirin, or Toradol for 72 hours prior to procedure,  No vitamins or herbal medications 7 days prior to procedure. Plans to see his medical doctor about his headaches this week, to have a medication on hand to help control his headaches, so he will not need to take an Aspirin product which is all he has at the present that works. Instructed to take laxative per doctor's office instructions and eat a light dinner the evening before procedure.   To arrive at 1530  for lithotripsy procedure.

## 2012-07-31 ENCOUNTER — Other Ambulatory Visit: Payer: Self-pay | Admitting: Urology

## 2012-08-01 ENCOUNTER — Encounter (HOSPITAL_COMMUNITY): Payer: Self-pay | Admitting: Pharmacy Technician

## 2012-08-06 ENCOUNTER — Encounter (HOSPITAL_COMMUNITY)
Admission: RE | Disposition: A | Discharge status: Home / Self Care | Payer: Self-pay | Source: Ambulatory Visit | Attending: Urology

## 2012-08-06 ENCOUNTER — Ambulatory Visit (HOSPITAL_COMMUNITY)
Admission: RE | Admit: 2012-08-06 | Discharge: 2012-08-06 | Disposition: A | Discharge status: Home / Self Care | Payer: Medicaid Other | Source: Ambulatory Visit | Attending: Urology | Admitting: Urology

## 2012-08-06 ENCOUNTER — Ambulatory Visit (HOSPITAL_COMMUNITY): Payer: Medicaid Other

## 2012-08-06 ENCOUNTER — Encounter (HOSPITAL_COMMUNITY): Payer: Self-pay

## 2012-08-06 DIAGNOSIS — E119 Type 2 diabetes mellitus without complications: Secondary | ICD-10-CM | POA: Insufficient documentation

## 2012-08-06 DIAGNOSIS — I10 Essential (primary) hypertension: Secondary | ICD-10-CM | POA: Insufficient documentation

## 2012-08-06 DIAGNOSIS — I499 Cardiac arrhythmia, unspecified: Secondary | ICD-10-CM | POA: Insufficient documentation

## 2012-08-06 DIAGNOSIS — N200 Calculus of kidney: Secondary | ICD-10-CM | POA: Insufficient documentation

## 2012-08-06 DIAGNOSIS — G4730 Sleep apnea, unspecified: Secondary | ICD-10-CM | POA: Insufficient documentation

## 2012-08-06 DIAGNOSIS — J45909 Unspecified asthma, uncomplicated: Secondary | ICD-10-CM | POA: Insufficient documentation

## 2012-08-06 DIAGNOSIS — Z79899 Other long term (current) drug therapy: Secondary | ICD-10-CM | POA: Insufficient documentation

## 2012-08-06 LAB — GLUCOSE, CAPILLARY
Glucose-Capillary: 82 mg/dL (ref 70–99)
Glucose-Capillary: 186 mg/dL — ABNORMAL HIGH (ref 70–99)

## 2012-08-06 SURGERY — LITHOTRIPSY, ESWL
Anesthesia: LOCAL | Laterality: Left

## 2012-08-06 MED ORDER — DEXTROSE-NACL 5-0.45 % IV SOLN
INTRAVENOUS | Status: DC
  Administered 2012-08-06: 13:00:00 via INTRAVENOUS

## 2012-08-06 MED ORDER — DIPHENHYDRAMINE HCL 25 MG PO CAPS
25.0000 mg | ORAL_CAPSULE | ORAL | Status: AC
  Administered 2012-08-06: 25 mg via ORAL
  Filled 2012-08-06: qty 1

## 2012-08-06 MED ORDER — CIPROFLOXACIN HCL 500 MG PO TABS
500.0000 mg | ORAL_TABLET | ORAL | Status: AC
  Administered 2012-08-06: 500 mg via ORAL
  Filled 2012-08-06: qty 1

## 2012-08-06 MED ORDER — DIAZEPAM 5 MG PO TABS
10.0000 mg | ORAL_TABLET | ORAL | Status: AC
  Administered 2012-08-06: 10 mg via ORAL
  Filled 2012-08-06: qty 2

## 2012-08-06 NOTE — Interval H&P Note (Signed)
History and Physical Interval Note:  08/06/2012 2:06 PM  [name] [name]  has presented today for surgery, with the diagnosis of left lower pole stone   The various methods of treatment have been discussed with the patient and family. After consideration of risks, benefits and other options for treatment, the patient has consented to  Procedure(s) (LRB) with comments: EXTRACORPOREAL SHOCK WAVE LITHOTRIPSY (ESWL) (Left) as a surgical intervention .  The patient's history has been reviewed, patient examined, no change in status, stable for surgery.  I have reviewed the patient's chart and labs.  Questions were answered to the patient's satisfaction.     Jethro Bolus I

## 2012-08-06 NOTE — H&P (Signed)
Reason For Visit  Cystoscopy & discuss ESWL   Active Problems Problems  1. Gross Hematuria 599.71 2. Nephrolithiasis Of Both Kidneys 592.0  History of Present Illness         67 YO diabetic male returns today for a cystoscopy for hx of gross hematuria & discuss Lt ESWL.  He was has seen by Jetta Lout, NP on 07/18/12 for gross hematuria and left flank pain X several weeks.   Hx of kidney stones.  Takes Potassium Citrate 15 MEQ 2 po BID. Urine pH: 7.0.  Hx of BPH and has tolerated Flomax in the past but became lightheaded with generic Tamsulosin & hypogonadism. Nocturia x 1.    S/P L renal lithotripsy 08-08-09. He has passed small granules, treated post op with Flomax and potassium citrate.    Note phlebitis in RLE, and now has venous insufficiency, HBP with HCTZ, and Azor (Dr. Mayford Knife).   Does take ASA 325 mg 1 po daily.   Past Medical History Problems  1. History of  Adult Sleep Apnea 780.57 2. History of  Asthma 493.90 3. History of  Depression 311 4. History of  Diabetes Mellitus 250.00 5. History of  Esophageal Reflux 530.81 6. History of  Heartburn 787.1 7. History of  Hydronephrosis On The Right 591 8. History of  Hypertension 401.9 9. History of  Thrombophlebitis Of Deep Vessels Of The Lower Extremity V12.51 10. History of  Ureteral Stone 592.1 11. History of  Visit For: Screening Exam Malignant Neoplasm Prostate V76.44  Surgical History Problems  1. History of  Cataract Surgery Bilateral 2. History of  Kidney Surgery 3. History of  Knee Surgery 4. History of  Lithotripsy Right 5. History of  Lithotripsy Left 6. History of  Lithotripsy 7. History of  Neck Surgery  Current Meds 1. Albuterol Sulfate (2.5 MG/3ML) 0.083% Inhalation Nebulization Solution; Therapy:  (Recorded:01Oct2008) to 2. Aspirin 325 MG Oral Tablet; Therapy: (Recorded:08Jan2014) to 3. Benicar TABS; Therapy: (Recorded:28Aug2012) to 4. Dulera AERO; Therapy: (Recorded:28Aug2012) to 5.  MetFORMIN HCl TABS; Therapy: (Recorded:28Aug2012) to 6. Metoprolol Tartrate TABS; Therapy: (Recorded:28Aug2012) to 7. TraMADol HCl 50 MG Oral Tablet; TAKE 1 TABLET Every 6 hours PRN; Therapy: 08Jan2014 to  (Last Rx:08Jan2014)  Requested for: 08Jan2014 8. Vitamin D3 CAPS; Therapy: (Recorded:08Jan2014) to  Allergies Medication  1. Hydrocodone-Acetaminophen TABS 2. Penicillins 3. Percocet TABS  Family History Problems  1. Family history of  Death In The Family Father 36, suicide 2. Family history of  Death In The Family Mother 7, heart failure 3. Maternal history of  Diabetes Mellitus V18.0 4. Family history of  Family Health Status Number Of Children 1 son  Social History Problems  1. Being A Social Drinker 2. Marital History - Currently Married 3. Occupation: security guard 4. History of  Tobacco Use V15.82 smoked 3/4 pack dailysmoked for 20 yearsquit smoking 28 years ago Denied  5. History of  Caffeine Use  Review of Systems Genitourinary, constitutional, skin, eye, otolaryngeal, hematologic/lymphatic, cardiovascular, pulmonary, endocrine, musculoskeletal, gastrointestinal, neurological and psychiatric system(s) were reviewed and pertinent findings if present are noted.  Genitourinary: hematuria.  Gastrointestinal: flank pain.    Vitals Vital Signs [Data Includes: Last 1 Day]  14Jan2014 03:22PM  Blood Pressure: 129 / 84 Temperature: 98.1 F Heart Rate: 82  Physical Exam Abdomen: The abdomen is soft and nontender. No masses are palpated. No CVA tenderness. No hernias are palpable. No hepatosplenomegaly noted.    Results/Data Urine [Data Includes: Last 1 Day]   14Jan2014  COLOR  YELLOW   APPEARANCE CLEAR   SPECIFIC GRAVITY 1.015   pH 7.0   GLUCOSE NEG mg/dL  BILIRUBIN NEG   KETONE NEG mg/dL  BLOOD NEG   PROTEIN NEG mg/dL  UROBILINOGEN 0.2 mg/dL  NITRITE NEG   LEUKOCYTE ESTERASE SMALL   SQUAMOUS EPITHELIAL/HPF RARE   WBC 11-20 WBC/hpf  RBC 0-2 RBC/hpf    BACTERIA FEW   CRYSTALS NONE SEEN   CASTS NONE SEEN   Other AMORPHOUS PRESENT   Selected Results  UA With REFLEX 14Jan2014 02:58PM [name] [name]   Test Name Result Flag Reference  COLOR YELLOW  YELLOW  APPEARANCE CLEAR  CLEAR  SPECIFIC GRAVITY 1.015  1.005-1.030  pH 7.0  5.0-8.0  GLUCOSE NEG mg/dL  NEG  BILIRUBIN NEG  NEG  KETONE NEG mg/dL  NEG  BLOOD NEG  NEG  PROTEIN NEG mg/dL  NEG  UROBILINOGEN 0.2 mg/dL  1.6-1.0  NITRITE NEG  NEG  LEUKOCYTE ESTERASE SMALL A NEG  SQUAMOUS EPITHELIAL/HPF RARE  RARE  WBC 11-20 WBC/hpf A <3  RBC 0-2 RBC/hpf  <3  BACTERIA FEW A RARE  CRYSTALS NONE SEEN  NONE SEEN  CASTS NONE SEEN  NONE SEEN  Other AMORPHOUS PRESENT     BUN & CREATININE 08Jan2014 01:06PM Jetta Lout  SPECIMEN TYPE: BLOOD   Test Name Result Flag Reference  CREATININE 1.10 mg/dL  9.60-4.54  BUN 28 mg/dL  0-98  Est GFR, African American 82 mL/min    Est GFR, NonAfrican American 71 mL/min    Performed at:        [facility]                      [address]                      [place], [place] [address]   The estimated GFR is a calculation valid for adults (74 to 65 years old) that uses the CKD-EPI algorithm to adjust for age and sex. It is not to be used for children, pregnant women, hospitalized patients, patients on dialysis, or with rapidly changing kidney function. According to the NKDEP, eGFR >89 is normal, 60-89 shows mild impairment, 30-59 shows moderate impairment, 15-29 shows severe impairment and <15 is ESRD.   AU CT-HEMATURIA PROTOCOL 08Jan2014 12:00AM Jetta Lout   Test Name Result Flag Reference  ** RADIOLOGY REPORT BY [facility] **   *RADIOLOGY REPORT*  Clinical Data: Gross hematuria, left flank pain, right lower quadrant pain. History of renal stones.  CT ABDOMEN AND PELVIS WITHOUT AND WITH CONTRAST  Technique: Multidetector CT imaging of the abdomen and pelvis was performed without contrast material in one  or both body regions, followed by contrast material(s) and further sections in one or both body regions.  Contrast: 125 ml Isovue 300 IV  Comparison: 04/11/2007  Findings: Lung bases are essentially clear.  Liver, spleen, pancreas, and adrenal glands are within normal limits.  Gallbladder is underdistended. No intrahepatic or extrahepatic ductal dilatation.  Two punctate nonobstructing right upper pole renal calculi (series 2/images 23 and 28). Cluster of nonobstructing calculi in the left lower kidney measuring 8 x 11 mm in aggregate (series 2/image 38). Tiny bilateral renal cysts. No enhancing renal lesions. No hydronephrosis.  No evidence of bowel obstruction. Duodenal diverticulum (series 3/image 31). Normal appendix. Extensive colonic diverticulosis, without associate inflammatory changes.  Atherosclerotic calcifications of the abdominal aorta and branch vessels.  No abdominopelvic ascites.  No suspicious  abdominopelvic lymphadenopathy.  Prostate is at the upper limits of normal for size, measuring 4.9 cm in transverse dimension.  No ureteral or bladder calculi. Bladder is underdistended.  On delayed imaging, there are no filling defects in the bilateral opacified proximal collecting systems, ureters, or bladder.  Small fat-containing right inguinal hernia.  Benign-appearing sclerotic lesion/bone island in the L5 vertebral body (series 894/image 89).  IMPRESSION:  Cluster of nonobstructing calculi in the left lower kidney measuring 8 x 11 mm in aggregate. Two punctate nonobstructing right upper pole renal calculi.  No ureteral or bladder calculi. No hydronephrosis.  No enhancing renal lesions.   Original Report Authenticated By: Charline Bills, M.D.   Procedure  Procedure: Cystoscopy  Chaperone Present: laura.  Indication: Hematuria.  Informed Consent: Risks, benefits, and potential adverse events were discussed and informed consent was obtained  from the patient.  Prep: The patient was prepped with betadine.  Anesthesia:. Local anesthesia was administered intraurethrally with 2% lidocaine jelly.  Antibiotic prophylaxis: Ciprofloxacin.  Procedure Note:  Urethral meatus:. No abnormalities.  Anterior urethra: No abnormalities.  Prostatic urethra: No abnormalities . The lateral prostatic lobes were enlarged. No intravesical median lobe was visualized.  Bladder: Visulization was clear. The ureteral orifices were in the normal anatomic position bilaterally and had clear efflux of urine. A systematic survey of the bladder demonstrated no bladder tumors or stones. The mucosa was smooth without abnormalities. Examination of the bladder demonstrated no trabeculation and no diverticulum. The patient tolerated the procedure well.  Complications: None.    Assessment Assessed  1. Gross Hematuria 599.71 2. Nephrolithiasis Of The Left Kidney 592.0   Gross hematuria 2ndary 8mm x 11mm LLP stone.  I can see ston on KUB. Cysto is negative. No pain.   Plan  Lithotripsy. He is allergic to percocet/pcn.   Signatures Electronically signed by : [name] [name], M.D.; Jul 24 2012  3:55PM

## 2014-05-07 ENCOUNTER — Other Ambulatory Visit (HOSPITAL_COMMUNITY): Payer: Medicaid Other

## 2014-05-16 ENCOUNTER — Inpatient Hospital Stay (HOSPITAL_COMMUNITY): Admission: RE | Admit: 2014-05-16 | Payer: Medicaid Other | Source: Ambulatory Visit | Admitting: Orthopedic Surgery

## 2014-05-16 ENCOUNTER — Encounter (HOSPITAL_COMMUNITY): Admission: RE | Payer: Self-pay | Source: Ambulatory Visit

## 2014-05-16 SURGERY — ARTHROPLASTY, KNEE, TOTAL
Anesthesia: General | Laterality: Right

## 2015-02-15 ENCOUNTER — Inpatient Hospital Stay (HOSPITAL_COMMUNITY)
Admission: EM | Admit: 2015-02-15 | Discharge: 2015-02-18 | DRG: 378 | Disposition: A | Discharge status: Home / Self Care | Payer: Medicare HMO | Attending: Internal Medicine | Admitting: Internal Medicine

## 2015-02-15 ENCOUNTER — Emergency Department (HOSPITAL_COMMUNITY): Payer: Medicare HMO

## 2015-02-15 ENCOUNTER — Encounter (HOSPITAL_COMMUNITY): Payer: Self-pay | Admitting: Emergency Medicine

## 2015-02-15 DIAGNOSIS — I10 Essential (primary) hypertension: Secondary | ICD-10-CM | POA: Diagnosis not present

## 2015-02-15 DIAGNOSIS — N200 Calculus of kidney: Secondary | ICD-10-CM | POA: Diagnosis present

## 2015-02-15 DIAGNOSIS — J4550 Severe persistent asthma, uncomplicated: Secondary | ICD-10-CM | POA: Diagnosis present

## 2015-02-15 DIAGNOSIS — Z8249 Family history of ischemic heart disease and other diseases of the circulatory system: Secondary | ICD-10-CM | POA: Diagnosis not present

## 2015-02-15 DIAGNOSIS — T39395A Adverse effect of other nonsteroidal anti-inflammatory drugs [NSAID], initial encounter: Secondary | ICD-10-CM | POA: Diagnosis not present

## 2015-02-15 DIAGNOSIS — K625 Hemorrhage of anus and rectum: Secondary | ICD-10-CM | POA: Diagnosis not present

## 2015-02-15 DIAGNOSIS — Z79899 Other long term (current) drug therapy: Secondary | ICD-10-CM

## 2015-02-15 DIAGNOSIS — G43709 Chronic migraine without aura, not intractable, without status migrainosus: Secondary | ICD-10-CM | POA: Diagnosis present

## 2015-02-15 DIAGNOSIS — E119 Type 2 diabetes mellitus without complications: Secondary | ICD-10-CM | POA: Diagnosis not present

## 2015-02-15 DIAGNOSIS — Z833 Family history of diabetes mellitus: Secondary | ICD-10-CM

## 2015-02-15 DIAGNOSIS — Z87891 Personal history of nicotine dependence: Secondary | ICD-10-CM

## 2015-02-15 DIAGNOSIS — K922 Gastrointestinal hemorrhage, unspecified: Secondary | ICD-10-CM | POA: Diagnosis not present

## 2015-02-15 DIAGNOSIS — D696 Thrombocytopenia, unspecified: Secondary | ICD-10-CM | POA: Diagnosis present

## 2015-02-15 DIAGNOSIS — K5731 Diverticulosis of large intestine without perforation or abscess with bleeding: Secondary | ICD-10-CM | POA: Diagnosis not present

## 2015-02-15 DIAGNOSIS — D62 Acute posthemorrhagic anemia: Secondary | ICD-10-CM | POA: Diagnosis not present

## 2015-02-15 DIAGNOSIS — Z87442 Personal history of urinary calculi: Secondary | ICD-10-CM

## 2015-02-15 DIAGNOSIS — Z77090 Contact with and (suspected) exposure to asbestos: Secondary | ICD-10-CM | POA: Diagnosis present

## 2015-02-15 HISTORY — DX: Diverticulosis of large intestine without perforation or abscess with bleeding: K57.31

## 2015-02-15 LAB — CBC
HCT: 32.3 % — ABNORMAL LOW (ref 39.0–52.0)
HCT: 29.1 % — ABNORMAL LOW (ref 39.0–52.0)
HEMATOCRIT: 41.6 % (ref 39.0–52.0)
HEMOGLOBIN: 9.5 g/dL — AB (ref 13.0–17.0)
Hemoglobin: 13.7 g/dL (ref 13.0–17.0)
Hemoglobin: 10.9 g/dL — ABNORMAL LOW (ref 13.0–17.0)
MCH: 29.3 pg (ref 26.0–34.0)
MCH: 29.9 pg (ref 26.0–34.0)
MCH: 29 pg (ref 26.0–34.0)
MCHC: 32.9 g/dL (ref 30.0–36.0)
MCHC: 33.7 g/dL (ref 30.0–36.0)
MCHC: 32.6 g/dL (ref 30.0–36.0)
MCV: 89.1 fL (ref 78.0–100.0)
MCV: 88.7 fL (ref 78.0–100.0)
MCV: 88.7 fL (ref 78.0–100.0)
PLATELETS: 130 10*3/uL — AB (ref 150–400)
Platelets: 128 10*3/uL — ABNORMAL LOW (ref 150–400)
Platelets: 136 10*3/uL — ABNORMAL LOW (ref 150–400)
RBC: 4.67 MIL/uL (ref 4.22–5.81)
RBC: 3.64 MIL/uL — AB (ref 4.22–5.81)
RBC: 3.28 MIL/uL — AB (ref 4.22–5.81)
RDW: 13.1 % (ref 11.5–15.5)
RDW: 13 % (ref 11.5–15.5)
RDW: 13 % (ref 11.5–15.5)
WBC: 6.7 10*3/uL (ref 4.0–10.5)
WBC: 6.8 10*3/uL (ref 4.0–10.5)
WBC: 6.8 10*3/uL (ref 4.0–10.5)

## 2015-02-15 LAB — GLUCOSE, CAPILLARY: GLUCOSE-CAPILLARY: 163 mg/dL — AB (ref 65–99)

## 2015-02-15 LAB — COMPREHENSIVE METABOLIC PANEL
ALT: 12 U/L — AB (ref 17–63)
ANION GAP: 8 (ref 5–15)
AST: 18 U/L (ref 15–41)
Albumin: 3.9 g/dL (ref 3.5–5.0)
Alkaline Phosphatase: 61 U/L (ref 38–126)
BUN: 22 mg/dL — AB (ref 6–20)
CO2: 25 mmol/L (ref 22–32)
Calcium: 8.9 mg/dL (ref 8.9–10.3)
Chloride: 106 mmol/L (ref 101–111)
Creatinine, Ser: 0.99 mg/dL (ref 0.61–1.24)
GFR calc Af Amer: >60 mL/min (ref >60)
GFR calc non Af Amer: >60 mL/min (ref >60)
GLUCOSE: 108 mg/dL — AB (ref 65–99)
Potassium: 4.6 mmol/L (ref 3.5–5.1)
SODIUM: 139 mmol/L (ref 135–145)
Total Bilirubin: 0.8 mg/dL (ref 0.3–1.2)
Total Protein: 6.5 g/dL (ref 6.5–8.1)

## 2015-02-15 LAB — TYPE AND SCREEN
ABO/RH(D): A POS
ANTIBODY SCREEN: NEGATIVE

## 2015-02-15 LAB — MRSA PCR SCREENING: MRSA by PCR: POSITIVE — AB

## 2015-02-15 LAB — I-STAT CG4 LACTIC ACID, ED: Lactic Acid, Venous: 1.35 mmol/L (ref 0.5–2.0)

## 2015-02-15 LAB — ABO/RH: ABO/RH(D): A POS

## 2015-02-15 LAB — HEMOGLOBIN AND HEMATOCRIT, BLOOD
HCT: 36.8 % — ABNORMAL LOW (ref 39.0–52.0)
HCT: 31.2 % — ABNORMAL LOW (ref 39.0–52.0)
Hemoglobin: 12.5 g/dL — ABNORMAL LOW (ref 13.0–17.0)
Hemoglobin: 10.7 g/dL — ABNORMAL LOW (ref 13.0–17.0)

## 2015-02-15 LAB — POC OCCULT BLOOD, ED: Fecal Occult Bld: POSITIVE — AB

## 2015-02-15 MED ORDER — PEG-KCL-NACL-NASULF-NA ASC-C 100 G PO SOLR
0.5000 | Freq: Once | ORAL | Status: DC
  Filled 2015-02-15: qty 1

## 2015-02-15 MED ORDER — IPRATROPIUM-ALBUTEROL 0.5-2.5 (3) MG/3ML IN SOLN
RESPIRATORY_TRACT | Status: AC
  Filled 2015-02-15: qty 3

## 2015-02-15 MED ORDER — IOHEXOL 300 MG/ML  SOLN
100.0000 mL | Freq: Once | INTRAMUSCULAR | Status: AC | PRN
  Administered 2015-02-15: 100 mL via INTRAVENOUS

## 2015-02-15 MED ORDER — SODIUM CHLORIDE 0.9 % IV SOLN: Freq: Once | INTRAVENOUS | Status: DC

## 2015-02-15 MED ORDER — IOHEXOL 300 MG/ML  SOLN
50.0000 mL | Freq: Once | INTRAMUSCULAR | Status: AC | PRN
  Administered 2015-02-15: 50 mL via ORAL

## 2015-02-15 MED ORDER — ONDANSETRON HCL 4 MG/2ML IJ SOLN
4.0000 mg | Freq: Three times a day (TID) | INTRAMUSCULAR | Status: AC | PRN
Start: 2015-02-15 — End: 2015-02-15

## 2015-02-15 MED ORDER — PEG-KCL-NACL-NASULF-NA ASC-C 100 G PO SOLR
0.5000 | Freq: Once | ORAL | Status: AC
  Administered 2015-02-16: 100 g via ORAL
  Filled 2015-02-15: qty 1

## 2015-02-15 MED ORDER — POTASSIUM CITRATE ER 10 MEQ (1080 MG) PO TBCR
30.0000 meq | EXTENDED_RELEASE_TABLET | Freq: Two times a day (BID) | ORAL | Status: DC
  Administered 2015-02-15 – 2015-02-18 (×7): 30 meq via ORAL
  Filled 2015-02-15 (×9): qty 3

## 2015-02-15 MED ORDER — IPRATROPIUM-ALBUTEROL 0.5-2.5 (3) MG/3ML IN SOLN
3.0000 mL | Freq: Two times a day (BID) | RESPIRATORY_TRACT | Status: DC
  Administered 2015-02-15 – 2015-02-17 (×5): 3 mL via RESPIRATORY_TRACT
  Filled 2015-02-15 (×4): qty 3

## 2015-02-15 MED ORDER — PEG-KCL-NACL-NASULF-NA ASC-C 100 G PO SOLR
0.5000 | Freq: Once | ORAL | Status: AC
  Administered 2015-02-15: 100 g via ORAL
  Filled 2015-02-15: qty 1

## 2015-02-15 MED ORDER — IPRATROPIUM-ALBUTEROL 20-100 MCG/ACT IN AERS: 1.0000 | INHALATION_SPRAY | Freq: Two times a day (BID) | RESPIRATORY_TRACT | Status: DC

## 2015-02-15 MED ORDER — ALBUTEROL SULFATE (2.5 MG/3ML) 0.083% IN NEBU
2.5000 mg | INHALATION_SOLUTION | Freq: Four times a day (QID) | RESPIRATORY_TRACT | Status: DC
  Administered 2015-02-15: 2.5 mg via RESPIRATORY_TRACT
  Filled 2015-02-15: qty 3

## 2015-02-15 MED ORDER — METOPROLOL SUCCINATE ER 25 MG PO TB24
25.0000 mg | ORAL_TABLET | ORAL | Status: DC
  Administered 2015-02-16 – 2015-02-18 (×2): 25 mg via ORAL
  Filled 2015-02-15 (×3): qty 1

## 2015-02-15 MED ORDER — DEXTROSE-NACL 5-0.45 % IV SOLN
INTRAVENOUS | Status: DC
  Administered 2015-02-15 – 2015-02-17 (×3): via INTRAVENOUS

## 2015-02-15 MED ORDER — DEXTROSE-NACL 5-0.9 % IV SOLN
INTRAVENOUS | Status: DC
  Administered 2015-02-15: 10:00:00 via INTRAVENOUS

## 2015-02-15 MED ORDER — PEG-KCL-NACL-NASULF-NA ASC-C 100 G PO SOLR: 1.0000 | Freq: Once | ORAL | Status: DC

## 2015-02-15 MED ORDER — MOMETASONE FURO-FORMOTEROL FUM 200-5 MCG/ACT IN AERO
2.0000 | INHALATION_SPRAY | Freq: Two times a day (BID) | RESPIRATORY_TRACT | Status: DC
  Administered 2015-02-15 – 2015-02-17 (×6): 2 via RESPIRATORY_TRACT
  Filled 2015-02-15: qty 8.8

## 2015-02-15 MED ORDER — SODIUM CHLORIDE 0.9 % IV BOLUS (SEPSIS)
1000.0000 mL | Freq: Once | INTRAVENOUS | Status: AC
  Administered 2015-02-15: 1000 mL via INTRAVENOUS

## 2015-02-15 MED ORDER — ACETAMINOPHEN 325 MG PO TABS
650.0000 mg | ORAL_TABLET | ORAL | Status: DC | PRN
  Administered 2015-02-15 – 2015-02-17 (×7): 650 mg via ORAL
  Filled 2015-02-15 (×7): qty 2

## 2015-02-15 NOTE — ED Provider Notes (Signed)
CSN: [id number]     Arrival date & time 02/15/15  0440 History   First MD Initiated Contact with Patient 02/15/15 [phone]     Chief Complaint  Patient presents with  . Rectal Bleeding     (Consider location/radiation/quality/duration/timing/severity/associated sxs/prior Treatment) HPI   [name] [name] is a 69 year old male with hx of asthma, DM, HTN who presents with 3 days of loose stools and one day of rectal bleeding, with associated nausea and subjective fever.  Last night at 2 am he had diarrhea with bright red blood present in the toilet, this occurred one more time and then he came to the ER.  He took a Goody powder at 4 am, and currently he has no nausea or fever. He further denies any vomiting, chills, sweats, abdominal pain, lightheadedness, SOB, CP, melena, back pain, rectal pain or mass, history of hemorrhoids, constipation.  He only recalls having one colonoscopy done, thinks it may have been in his 40's.  His wife has had some nausea and abdominal pain today, but otherwise he denies any sick contacts.    Past Medical History  Diagnosis Date  . Allergic rhinitis   . Asthma   . Diabetes mellitus type II   . Hypertension   . Dysrhythmia     tachycardia  . Chronic kidney disease     kidney stones   Past Surgical History  Procedure Laterality Date  . Cervical fusion  10/13/10    Dr. Lovell Sheehan  . Eye surgery  2008    Bilateral catract extraction  . Knee arthroscopy  80's and 90's  . Cystoscopy with retrograde pyelogram, ureteroscopy and stent placement  2010  . Cystoscopy w/ ureteral stent removal  2010   Family History  Problem Relation Age of Onset  . Heart attack    . Diabetes     History  Substance Use Topics  . Smoking status: Former Smoker -- 1.00 packs/day for 15 years    Quit date: 07/11/1978  . Smokeless tobacco: Never Used  . Alcohol Use: No    Review of Systems  Constitutional: Positive for fever. Negative for chills, diaphoresis, activity change,  appetite change, fatigue and unexpected weight change.  HENT: Negative.   Eyes: Negative.   Respiratory: Negative.   Cardiovascular: Negative.   Gastrointestinal: Negative for vomiting, abdominal pain, constipation, abdominal distention and rectal pain.  Genitourinary: Negative.   Musculoskeletal: Negative.   Skin: Negative.   Neurological: Negative.   Hematological: Negative.   Psychiatric/Behavioral: Negative.     Allergies  Hydrocodone; Percocet; Flu virus vaccine; and Penicillins  Home Medications   Prior to Admission medications   Medication Sig Start Date End Date Taking? Authorizing Provider  Aspirin-Acetaminophen-Caffeine (GOODY HEADACHE PO) Take 1 packet by mouth every 6 (six) hours as needed. Pain   Yes Historical Provider, MD  Copper Gluconate 2 MG TABS Take 1 tablet by mouth daily.   Yes Historical Provider, MD  Ipratropium-Albuterol (COMBIVENT RESPIMAT) 20-100 MCG/ACT AERS respimat Inhale 1 puff into the lungs 2 (two) times daily.   Yes Historical Provider, MD  losartan (COZAAR) 50 MG tablet Take 50 mg by mouth daily.   Yes Historical Provider, MD  metFORMIN (GLUCOPHAGE) 500 MG tablet Take 500 mg by mouth 2 (two) times daily.    Yes Historical Provider, MD  Potassium Citrate (UROCIT-K 15) 15 MEQ (1620 MG) TBCR Take 2 tablets by mouth 2 (two) times daily.   Yes Historical Provider, MD  predniSONE (DELTASONE) 20 MG tablet Take 20  mg by mouth daily as needed. As needed for when he has difficulty breathing   Yes Historical Provider, MD  albuterol (PROVENTIL) (2.5 MG/3ML) 0.083% nebulizer solution Take 2.5 mg by nebulization as needed. Wheezing and shortness of breath    Historical Provider, MD  butalbital-acetaminophen-caffeine (FIORICET, ESGIC) 50-325-40 MG per tablet Take 1 tablet by mouth 2 (two) times daily as needed. migraines    Historical Provider, MD  Calcium Citrate-Vitamin D (CITRUS CALCIUM 1500 + D PO) Take 1 tablet by mouth 2 (two) times daily.    Historical  Provider, MD  magnesium gluconate (MAGONATE) 500 MG tablet Take 500 mg by mouth daily.    Historical Provider, MD  metoprolol succinate (TOPROL-XL) 25 MG 24 hr tablet Take 25 mg by mouth every Monday, Wednesday, and Friday.     Historical Provider, MD  Mometasone Furo-Formoterol Fum (DULERA) 200-5 MCG/ACT AERO Inhale 2 puffs into the lungs 2 (two) times daily. Patient not taking: Reported on 02/15/2015 12/22/10   [name] Frisk, MD  olmesartan (BENICAR) 20 MG tablet Take 20 mg by mouth daily before breakfast.     Historical Provider, MD  Selenium 200 MCG TABS Take 1 tablet by mouth daily.    Historical Provider, MD   BP 137/79 mmHg  Pulse 98  Temp(Src) 97.9 F (36.6 C) (Oral)  Resp 13  Ht 5\' 7"  (1.702 m)  Wt 145 lb (65.772 kg)  BMI 22.71 kg/m2  SpO2 98% Physical Exam  Constitutional: He is oriented to person, place, and time. He appears well-developed and well-nourished. No distress.  HENT:  Head: Normocephalic and atraumatic.  Nose: Nose normal.  Mouth/Throat: Oropharynx is clear and moist. No oropharyngeal exudate.  Eyes: Conjunctivae and EOM are normal. Pupils are equal, round, and reactive to light. Right eye exhibits no discharge. Left eye exhibits no discharge. No scleral icterus.  Neck: Normal range of motion. No JVD present. No tracheal deviation present. No thyromegaly present.  Cardiovascular: Regular rhythm, normal heart sounds and intact distal pulses.  Tachycardia present.  Exam reveals no gallop and no friction rub.   No murmur heard. Pulses:      Radial pulses are 2+ on the right side, and 2+ on the left side.       Posterior tibial pulses are 2+ on the right side, and 2+ on the left side.  Pulmonary/Chest: Effort normal and breath sounds normal. No respiratory distress. He has no wheezes. He has no rales. He exhibits no tenderness.  Abdominal: Soft. Bowel sounds are normal. He exhibits no distension and no mass. There is no tenderness. There is no rebound and no  guarding.  Genitourinary: Rectal exam shows no external hemorrhoid, no internal hemorrhoid, no fissure, no mass, no tenderness and anal tone normal. Guaiac positive stool. Prostate is not enlarged and not tender.  Frank blood  Musculoskeletal: Normal range of motion. He exhibits no edema or tenderness.  Lymphadenopathy:    He has no cervical adenopathy.  Neurological: He is alert and oriented to person, place, and time. He has normal reflexes. No cranial nerve deficit. He exhibits normal muscle tone. Coordination normal.  Skin: Skin is warm and dry. No rash noted. He is not diaphoretic. No erythema. No pallor.  Psychiatric: He has a normal mood and affect. His behavior is normal. Judgment and thought content normal.  Nursing note and vitals reviewed.   ED Course  Procedures (including critical care time) Labs Review Labs Reviewed  COMPREHENSIVE METABOLIC PANEL - Abnormal; Notable for the following:  Glucose, Bld 108 (*)    BUN 22 (*)    ALT 12 (*)    All other components within normal limits  CBC - Abnormal; Notable for the following:    Platelets 130 (*)    All other components within normal limits  POC OCCULT BLOOD, ED - Abnormal; Notable for the following:    Fecal Occult Bld POSITIVE (*)    All other components within normal limits  I-STAT CG4 LACTIC ACID, ED  TYPE AND SCREEN    Imaging Review Ct Abdomen Pelvis W Contrast  02/15/2015   CLINICAL DATA:  Bright red bleeding per rectum today. Initial encounter.  EXAM: CT ABDOMEN AND PELVIS WITH CONTRAST  TECHNIQUE: Multidetector CT imaging of the abdomen and pelvis was performed using the standard protocol following bolus administration of intravenous contrast.  CONTRAST:  50mL OMNIPAQUE IOHEXOL 300 MG/ML SOLN, OMNIPAQUE IOHEXOL 300 MG/ML SOLN  COMPARISON:  CT 07/18/2012.  FINDINGS: Lower chest: Clear lung bases. No significant pleural or pericardial effusion. Possible mild distal esophageal wall thickening without significant  hiatal hernia.  Hepatobiliary: The liver is normal in density without focal abnormality. No evidence of gallstones, gallbladder wall thickening or biliary dilatation.  Pancreas: Unremarkable. No pancreatic ductal dilatation or surrounding inflammatory changes.  Spleen: Normal in size without focal abnormality.  Adrenals/Urinary Tract: Both adrenal glands appear normal.The right kidney demonstrates a tiny nonobstructing calculus in its interpolar region and mild cortical scarring in its upper pole. Mild prominence of the right collecting system is stable without evidence of ureteral calculus. The left kidney demonstrates more extensive cortical thinning and scarring with nonobstructing calculi in its lower pole measuring up to 10 mm in diameter on image 32. No evidence of renal mass. The bladder appears unremarkable.  Stomach/Bowel: No evidence of bowel wall thickening, distention or surrounding inflammatory change.There are moderate diverticular changes throughout the descending and sigmoid colon. The rectum is moderately distended with liquid stool. The appendix appears normal.  Vascular/Lymphatic: There are no enlarged abdominal or pelvic lymph nodes. Moderate aortoiliac atherosclerosis. Retrocardiac left renal vein noted.  Reproductive: Unremarkable.  Other: No evidence of abdominal wall mass or hernia.  Musculoskeletal: No acute or significant osseous findings. Stable bone island within the L5 vertebral body.  IMPRESSION: 1. No clear explanation for rectal bleeding. There is moderate diverticulosis of the sigmoid and descending colon, a probable cause for rectal bleeding. 2. Bilateral nephrolithiasis with renal cortical scarring and asymmetric atrophy on the left. No evidence of ureteral calculus or collecting system obstruction. 3. Moderate atherosclerosis.   Electronically Signed   By: Carey Bullocks M.D.   On: 02/15/2015 07:56     EKG Interpretation None      MDM   Final diagnoses:  Bright red  rectal bleeding  Bright red rectal bleeding    Rectal bleeding-observed a few drops of dark blood in the hat in the toilet here in the ER, no abdominal pain Plan - basic labs, DRE with hemoccult, IV Pt discussed with Dr. Nicanor Alcon - requested addition of CT ab/pelvis  Labs pertinent for mild thrombocytopenia and Hemoccult-positive.  Initial mild tachycardia was resolved with a liter bolus. CT shows diverticulosis without evidence of diverticulitis  Medications  dextrose 5 %-0.9 % sodium chloride infusion ( Intravenous Transfusing/Transfer 02/15/15 1013)  sodium chloride 0.9 % bolus 1,000 mL (0 mLs Intravenous Stopped 02/15/15 0931)  iohexol (OMNIPAQUE) 300 MG/ML solution 50 mL (50 mLs Oral Contrast Given 02/15/15 0721)  iohexol (OMNIPAQUE) 300 MG/ML solution 100 mL (100  mLs Intravenous Contrast Given 02/15/15 0735)   Filed Vitals:   02/15/15 0448 02/15/15 0654 02/15/15 0830 02/15/15 0847  BP: 151/77 137/80 137/79 137/79  Pulse: 105 96 98 100  Temp: 97.9 F (36.6 C)     TempSrc: Oral     Resp: Height:  (1.702 m)     Weight: 145 lb (65.772 kg)     SpO2: 98% 98% 98% 99%   Pt has had multiple trips to the bathroom with large amounts of dark red blood.  Still is asymptomatic. Will need admission for Lower GI bleed and colonoscopy  Pt gives further history of multiple goody powder's a day - usually 3 - may be etiology of bleed depending on which one he is taking 500-845 mg ASA x3   Medicine to admit - Dr. Mahala Menghini, GI consulted - Dr. Reola Calkins, PA-C 02/15/15 1026  April Palumbo, MD 02/15/15 2319

## 2015-02-15 NOTE — H&P (Signed)
[facility] History and Physical  MAISEN KLINGLER ZOX:[id number] DOB: 1945/09/05 DOA: 02/15/2015  Referring physician: ED PCP: Jearld Lesch, MD  Specialists: GI consulted by ED  69 y/o ? h/o dm tyii, htn, severe persistent asthma foll by Pulmonary,  Gross hematuria foll by Dr. Urology with hx nephrolithiasis s/p lithotripsy 07/2009  + Ureteric stent placed 10/1/8 for stone htn Patient has a history of chronic migraine headaches and has been taking Goody powders for the past 10-12 years probably 2-3 a day States that on 02/11/15 he and his wife both started developing diarrhea and malaise after eating some chicken Noted that he has been having 3-4 semi-formed stools a day however on the evening 02/14/15 he developed dark stool and BRB per rectum Stool was not performed He has had some nausea and some abdominal pain associated with this in the upper abdomen as well He has had no hematemesis He has no icterus He has not been on antibiotics recently   He does not have any chest pain, shortness of breath, chills, right ear, fever, unilateral weakness, orthostasis, dizziness, rash, other illness  Emergency room workup revealed hemoglobin 13.7 with a baseline of 14.4 Platelets 130  BUN/creatinine 22/0.99 Lactic acid 1.3 CT abdomen pelvis = moderate diverticulosis sigmoid and descending colon? Cause, bilateral nephrolithiasis without any obstruction, moderate atherosclerosis   Past Medical History  Diagnosis Date  . Allergic rhinitis   . Asthma   . Diabetes mellitus type II   . Hypertension   . Dysrhythmia     tachycardia  . Chronic kidney disease     kidney stones   Past Surgical History  Procedure Laterality Date  . Cervical fusion  10/13/10    Dr. Lovell Sheehan  . Eye surgery  2008    Bilateral catract extraction  . Knee arthroscopy  80's and 90's  . Cystoscopy with retrograde pyelogram, ureteroscopy and stent placement  2010  . 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 Hydrocodone Shortness Of Breath  . Percocet [Oxycodone-Acetaminophen] Shortness Of Breath  . Flu Virus Vaccine Nausea And Vomiting    REACTION: egg allergy  . Penicillins Other (See Comments)    Passes out    Family History  Problem Relation Age of Onset  . Heart attack    . Diabetes      Prior to Admission medications   Medication Sig Start Date End Date Taking? Authorizing Provider  Aspirin-Acetaminophen-Caffeine (GOODY HEADACHE PO) Take 1 packet by mouth every 6 (six) hours as needed. Pain   Yes Historical Provider, MD  Copper Gluconate 2 MG TABS Take 1 tablet by mouth daily.   Yes Historical Provider, MD  Ipratropium-Albuterol (COMBIVENT RESPIMAT) 20-100 MCG/ACT AERS respimat Inhale 1 puff into the lungs 2 (two) times daily.   Yes Historical Provider, MD  losartan (COZAAR) 50 MG tablet Take 50 mg by mouth daily.   Yes Historical Provider, MD  metFORMIN (GLUCOPHAGE) 500 MG tablet Take 500 mg by mouth 2 (two) times daily.    Yes Historical Provider, MD  Potassium Citrate (UROCIT-K 15) 15 MEQ (1620 MG) TBCR Take 2 tablets by mouth 2 (two) times daily.   Yes Historical Provider, MD  predniSONE (DELTASONE) 20 MG tablet Take 20  mg by mouth daily as needed. As needed for when he has difficulty breathing   Yes Historical Provider, MD  albuterol (PROVENTIL) (2.5 MG/3ML) 0.083% nebulizer solution Take 2.5 mg by nebulization as needed. Wheezing and shortness of breath    Historical Provider, MD  butalbital-acetaminophen-caffeine (FIORICET, ESGIC) 50-325-40 MG per tablet Take 1 tablet by mouth 2 (two) times daily as needed. migraines    Historical Provider, MD  Calcium Citrate-Vitamin D (CITRUS CALCIUM 1500 + D PO) Take 1 tablet by mouth 2 (two)  times daily.    Historical Provider, MD  magnesium gluconate (MAGONATE) 500 MG tablet Take 500 mg by mouth daily.    Historical Provider, MD  metoprolol succinate (TOPROL-XL) 25 MG 24 hr tablet Take 25 mg by mouth every Monday, Wednesday, and Friday.     Historical Provider, MD  Mometasone Furo-Formoterol Fum (DULERA) 200-5 MCG/ACT AERO Inhale 2 puffs into the lungs 2 (two) times daily. Patient not taking: Reported on 02/15/2015 12/22/10   Storm Frisk, MD  olmesartan (BENICAR) 20 MG tablet Take 20 mg by mouth daily before breakfast.     Historical Provider, MD  Selenium 200 MCG TABS Take 1 tablet by mouth daily.    Historical Provider, MD   Physical Exam: Filed Vitals:   02/15/15 0448 02/15/15 0654 02/15/15 0830 02/15/15 0847  BP: 151/77 137/80 137/79 137/79  Pulse: 105 96 98 100  Temp: 97.9 F (36.6 C)     TempSrc: Oral     Resp: 18 18 13 19   Height: 5\' 7"  (1.702 m)     Weight: 65.772 kg (145 lb)     SpO2: 98% 98% 98% 99%    On exam EOMI NCAT pleasant slightly anxious, moderate dentition, Mallampati 2 S1-S2 no murmur rub or gallop no PMI displacement Chest clinically clear no added sound Abdomen soft, no organomegaly, no epigastric tenderness-rectal exam deferred Lower extremities is soft without swelling no rash Neurologically grossly intact   Labs on Admission:  Basic Metabolic Panel:  Recent Labs Lab 02/15/15 0543  NA 139  K 4.6  CL 106  CO2 25  GLUCOSE 108*  BUN 22*  CREATININE 0.99  CALCIUM 8.9   Liver Function Tests:  Recent Labs Lab 02/15/15 0543  AST 18  ALT 12*  ALKPHOS 61  BILITOT 0.8  PROT 6.5  ALBUMIN 3.9   No results for input(s): LIPASE, AMYLASE in the last 168 hours. No results for input(s): AMMONIA in the last 168 hours. CBC:  Recent Labs Lab 02/15/15 0543  WBC 6.7  HGB 13.7  HCT 41.6  MCV 89.1  PLT 130*   Cardiac Enzymes: No results for input(s): CKTOTAL, CKMB, CKMBINDEX, TROPONINI in the last 168 hours.  BNP (last 3  results) No results for input(s): BNP in the last 8760 hours.  ProBNP (last 3 results) No results for input(s): PROBNP in the last 8760 hours.  CBG: No results for input(s): GLUCAP in the last 168 hours.  Radiological Exams on Admission: Ct Abdomen Pelvis W Contrast  02/15/2015   CLINICAL DATA:  Bright red bleeding per rectum today. Initial encounter.  EXAM: CT ABDOMEN AND PELVIS WITH CONTRAST  TECHNIQUE: Multidetector CT imaging of the abdomen and pelvis was performed using the standard protocol following bolus administration of intravenous contrast.  CONTRAST:  50mL OMNIPAQUE IOHEXOL 300 MG/ML SOLN, OMNIPAQUE IOHEXOL 300 MG/ML SOLN  COMPARISON:  CT 07/18/2012.  FINDINGS: Lower chest: Clear lung bases. No significant pleural or pericardial effusion. Possible mild distal esophageal wall  thickening without significant hiatal hernia.  Hepatobiliary: The liver is normal in density without focal abnormality. No evidence of gallstones, gallbladder wall thickening or biliary dilatation.  Pancreas: Unremarkable. No pancreatic ductal dilatation or surrounding inflammatory changes.  Spleen: Normal in size without focal abnormality.  Adrenals/Urinary Tract: Both adrenal glands appear normal.The right kidney demonstrates a tiny nonobstructing calculus in its interpolar region and mild cortical scarring in its upper pole. Mild prominence of the right collecting system is stable without evidence of ureteral calculus. The left kidney demonstrates more extensive cortical thinning and scarring with nonobstructing calculi in its lower pole measuring up to 10 mm in diameter on image 32. No evidence of renal mass. The bladder appears unremarkable.  Stomach/Bowel: No evidence of bowel wall thickening, distention or surrounding inflammatory change.There are moderate diverticular changes throughout the descending and sigmoid colon. The rectum is moderately distended with liquid stool. The appendix appears normal.   Vascular/Lymphatic: There are no enlarged abdominal or pelvic lymph nodes. Moderate aortoiliac atherosclerosis. Retrocardiac left renal vein noted.  Reproductive: Unremarkable.  Other: No evidence of abdominal wall mass or hernia.  Musculoskeletal: No acute or significant osseous findings. Stable bone island within the L5 vertebral body.  IMPRESSION: 1. No clear explanation for rectal bleeding. There is moderate diverticulosis of the sigmoid and descending colon, a probable cause for rectal bleeding. 2. Bilateral nephrolithiasis with renal cortical scarring and asymmetric atrophy on the left. No evidence of ureteral calculus or collecting system obstruction. 3. Moderate atherosclerosis.   Electronically Signed   By: Carey Bullocks M.D.   On: 02/15/2015 07:56    Probable Diverticular bleed versus other LGIB -Admitted as observation -Cycle blood count every 12 for 3 checks -Clear liquids only for now -Might benefit from anoscopy/sigmoidoscopy if continues to bleed however no need right now and manage supportively with IV saline 50 cc per hour dextrose as diabetic -Not sure why he is on prednisone [could be for asthma flare] however I will discontinue the same -GI consult needed if patient continues to bleed or drops hemoglobin precipitously--I have asked emergency room to contact them   Hypertension -patient is on both April losartan. We will reorder losartan 50 daily -Continue metoprolol Monday Wednesday Friday 25 XL  Prior nephrolithiasis -Continue potassium citrate 2 tabs twice a day  Diabetes mellitus type 2 since 2012 -No evidence of end organ damage -Need outpatient retinopathy/neuropathy screening -Would hold oral hypoglycemic agent metformin 500 twice a day for now  Severe persistent asthma -Continue mometasone 200-5 twice a day, Combivent 1 puff twice a day   migraine headaches -Patient will need further management of his headaches without Goody powders -have educated him about  this    Appt with PCP: Requested Code Status:presumed  Full code Family Communication: Discussed with wife at bedside Disposition Plan: Likely can discharge home DVT prophylaxis: SCD Consultants: Gastroenterology consulted Procedures:   Time spent: 76  Rhetta Mura [facility] Pager [phone]  If 7PM-7AM, please contact night-coverage [email or web address] Password [email or web address] 02/15/2015, 9:00 AM

## 2015-02-15 NOTE — ED Notes (Signed)
Report received from previous RN, Allan 

## 2015-02-15 NOTE — ED Notes (Signed)
Pt arrived to the ED with a complaint of rectal bleeding.  Pt states he has been feeling "[name] [name]" for a couple of days.  When the rain woke the pt up around 0230 this am he went to the bathroom and had rectal bleeding.  Pt has since returned to the bathroom with a similar result.

## 2015-02-15 NOTE — Consult Note (Signed)
Consultation  Referring Provider: ER MD- Lucien Mons Primary Care Physician:  Jearld Lesch, MD Primary Gastroenterologist:  none  Reason for Consultation:   Rectal bleeding  HPI: [name] [name] is a 69 y.o. male who presented to the emergency room this morning after onset of bright red blood per rectum at about 2 AM today. Patient says he had not felt well over the past couple of days with onset Thursday of mild diarrhea with 1-2 bowel movements per day sense of low-grade fever and mild nausea. He says his wife had been having some problems with diarrhea and abdominal discomfort for couple of days prior to that. He was awakened by a finder last night at about 1 AM and then says after that felt the urge for bowel movement, went to the bathroom and passed a lot of red blood in the commode with liquid stool. He did not have any associated abdominal pain or cramping no diaphoresis, no nausea or vomiting. He had 2 more episodes at home before coming to the emergency room. His wife says that it looked like a lot of dark red blood in the commode. He has had 2 more episodes since being in the ER. He denies any dizziness lightheadedness chest pain shortness of breath etc. Rectal exam per ER showed frank blood. CT of the abdomen and pelvis was done which shows left-sided diverticulosis some atherosclerotic changes and bilateral nephrolithiasis. Hemoglobin 13.7, hematocrit 41.6, MCV of 89.1 on arrival. He is not on any blood thinners but does take Goody powders 2 or 3 per day generally. Other medical problems include adult onset diabetes mellitus, nephrolithiasis, hypertension, and asthma. Family history negative for colon cancer or polyps. Patient relates having a remote colonoscopy done probably 20 years ago for diarrhea and says [place] was negative exam and was told he had a "spastic colon". He does not remember who performed this procedure.   Past Medical History  Diagnosis Date  . Allergic rhinitis     . Asthma   . Diabetes mellitus type II   . Hypertension   . Dysrhythmia     tachycardia  . Chronic kidney disease     kidney stones    Past Surgical History  Procedure Laterality Date  . Cervical fusion  10/13/10    Dr. Lovell Sheehan  . Eye surgery  2008    Bilateral catract extraction  . Knee arthroscopy  80's and 90's  . Cystoscopy with retrograde pyelogram, ureteroscopy and stent placement  2010  . Cystoscopy w/ ureteral stent removal  2010    Prior to Admission medications   Medication Sig Start Date End Date Taking? Authorizing Provider  Aspirin-Acetaminophen-Caffeine (GOODY HEADACHE PO) Take 1 packet by mouth every 6 (six) hours as needed. Pain   Yes Historical Provider, MD  Copper Gluconate 2 MG TABS Take 1 tablet by mouth daily.   Yes Historical Provider, MD  Ipratropium-Albuterol (COMBIVENT RESPIMAT) 20-100 MCG/ACT AERS respimat Inhale 1 puff into the lungs 2 (two) times daily.   Yes Historical Provider, MD  losartan (COZAAR) 50 MG tablet Take 50 mg by mouth daily.   Yes Historical Provider, MD  metFORMIN (GLUCOPHAGE) 500 MG tablet Take 500 mg by mouth 2 (two) times daily.    Yes Historical Provider, MD  Potassium Citrate (UROCIT-K 15) 15 MEQ (1620 MG) TBCR Take 2 tablets by mouth 2 (two) times daily.   Yes Historical Provider, MD  predniSONE (DELTASONE) 20 MG tablet Take 20 mg by mouth daily as needed. As  needed for when he has difficulty breathing   Yes Historical Provider, MD  albuterol (PROVENTIL) (2.5 MG/3ML) 0.083% nebulizer solution Take 2.5 mg by nebulization as needed. Wheezing and shortness of breath    Historical Provider, MD  butalbital-acetaminophen-caffeine (FIORICET, ESGIC) 50-325-40 MG per tablet Take 1 tablet by mouth 2 (two) times daily as needed. migraines    Historical Provider, MD  Calcium Citrate-Vitamin D (CITRUS CALCIUM 1500 + D PO) Take 1 tablet by mouth 2 (two) times daily.    Historical Provider, MD  magnesium gluconate (MAGONATE) 500 MG tablet Take 500  mg by mouth daily.    Historical Provider, MD  metoprolol succinate (TOPROL-XL) 25 MG 24 hr tablet Take 25 mg by mouth every Monday, Wednesday, and Friday.     Historical Provider, MD  Mometasone Furo-Formoterol Fum (DULERA) 200-5 MCG/ACT AERO Inhale 2 puffs into the lungs 2 (two) times daily. Patient not taking: Reported on 02/15/2015 12/22/10   Storm Frisk, MD  olmesartan (BENICAR) 20 MG tablet Take 20 mg by mouth daily before breakfast.     Historical Provider, MD  Selenium 200 MCG TABS Take 1 tablet by mouth daily.    Historical Provider, MD    Current Facility-Administered Medications  Medication Dose Route Frequency Provider Last Rate Last Dose  . dextrose 5 %-0.9 % sodium chloride infusion   Intravenous Continuous Danelle Berry, PA-C 50 mL/hr at 02/15/15 9147     Current Outpatient Prescriptions  Medication Sig Dispense Refill  . Aspirin-Acetaminophen-Caffeine (GOODY HEADACHE PO) Take 1 packet by mouth every 6 (six) hours as needed. Pain    . Copper Gluconate 2 MG TABS Take 1 tablet by mouth daily.    . Ipratropium-Albuterol (COMBIVENT RESPIMAT) 20-100 MCG/ACT AERS respimat Inhale 1 puff into the lungs 2 (two) times daily.    Marland Kitchen losartan (COZAAR) 50 MG tablet Take 50 mg by mouth daily.    . metFORMIN (GLUCOPHAGE) 500 MG tablet Take 500 mg by mouth 2 (two) times daily.     . Potassium Citrate (UROCIT-K 15) 15 MEQ (1620 MG) TBCR Take 2 tablets by mouth 2 (two) times daily.    . predniSONE (DELTASONE) 20 MG tablet Take 20 mg by mouth daily as needed. As needed for when he has difficulty breathing    . albuterol (PROVENTIL) (2.5 MG/3ML) 0.083% nebulizer solution Take 2.5 mg by nebulization as needed. Wheezing and shortness of breath    . butalbital-acetaminophen-caffeine (FIORICET, ESGIC) 50-325-40 MG per tablet Take 1 tablet by mouth 2 (two) times daily as needed. migraines    . Calcium Citrate-Vitamin D (CITRUS CALCIUM 1500 + D PO) Take 1 tablet by mouth 2 (two) times daily.    .  magnesium gluconate (MAGONATE) 500 MG tablet Take 500 mg by mouth daily.    . metoprolol succinate (TOPROL-XL) 25 MG 24 hr tablet Take 25 mg by mouth every Monday, Wednesday, and Friday.     . Mometasone Furo-Formoterol Fum (DULERA) 200-5 MCG/ACT AERO Inhale 2 puffs into the lungs 2 (two) times daily. (Patient not taking: Reported on 02/15/2015) 13 g 5  . olmesartan (BENICAR) 20 MG tablet Take 20 mg by mouth daily before breakfast.     . Selenium 200 MCG TABS Take 1 tablet by mouth daily.      Allergies as of 02/15/2015 - Review Complete 02/15/2015  Allergen Reaction Noted  . Hydrocodone Shortness Of Breath 02/15/2015  . Percocet [oxycodone-acetaminophen] Shortness Of Breath 08/01/2012  . Flu virus vaccine Nausea And Vomiting   .  Penicillins Other (See Comments)     Family History  Problem Relation Age of Onset  . Heart attack    . Diabetes      History   Social History  . Marital Status: Married    Spouse Name: N/A  . Number of Children: N/A  . Years of Education: N/A   Occupational History  . Security Guard    Social History Main Topics  . Smoking status: Former Smoker -- 1.00 packs/day for 15 years    Quit date: 07/11/1978  . Smokeless tobacco: Never Used  . Alcohol Use: No  . Drug Use: No  . Sexual Activity: Not on file   Other Topics Concern  . 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  All other review of systems was otherwise negative.  Physical Exam: Vital signs in last 24 hours: Temp:  [97.9 F (36.6 C)] 97.9 F (36.6 C) (08/07 0448) Pulse Rate:  [96-105] 100 (08/07 0847) Resp:  [13-19] 19 (08/07 0847) BP: (137-151)/(77-80) 137/79 mmHg (08/07 0847) SpO2:  [98 %-99 %] 99 % (08/07 0847) Weight:   [145 lb (65.772 kg)] 145 lb (65.772 kg) (08/07 0448)   General:   Alert,  Well-developed, well-nourished,older white male  pleasant and cooperative in NAD Head:  Normocephalic and atraumatic. Eyes:  Sclera clear, no icterus.   Conjunctiva pink. Ears:  Normal auditory acuity. Nose:  No deformity, discharge,  or lesions. Mouth:  No deformity or lesions.   Neck:  Supple; no masses or thyromegaly. Lungs:  Clear throughout to auscultation.   No wheezes, crackles, or rhonchi. Heart:tachy  Regular rate and rhythm; no murmurs, clicks, rubs,  or gallops. Abdomen:  Soft,nontender, BS active,nonpalp mass or hsm.   Rectal:  Homero Fellers blood per ER PA Msk:  Symmetrical without gross deformities. . Pulses:  Normal pulses noted. Extremities:  Without clubbing or edema. Neurologic:  Alert and  oriented x4;  grossly normal neurologically. Skin:  Intact without significant lesions or rashes.. Psych:  Alert and cooperative. Normal mood and affect.   Lab Results:  Recent Labs  02/15/15 0543  WBC 6.7  HGB 13.7  HCT 41.6  PLT 130*   BMET  Recent Labs  02/15/15 0543  NA 139  K 4.6  CL 106  CO2 25  GLUCOSE 108*  BUN 22*  CREATININE 0.99  CALCIUM 8.9   LFT  Recent Labs  02/15/15 0543  PROT 6.5  ALBUMIN 3.9  AST 18  ALT 12*  ALKPHOS 61  BILITOT 0.8    IMPRESSION:    #1  69 yo male with acute  lower GI bleed, hemodynamically stable and normal hemoglobin on admit. Suspect this may be diverticular in origin, cannot rule out other colonic source. #2 AODM #3 HTN  #4 asthma  #5 nephrolithiasis  Plan;  clear liquid diet today Prep colon this evening  Plan for Colonoscopy with Dr Leone Payor tomorrow. Procedure was discussed with pt and wife Serial hgb's , and transfuse as indicated  bedside commode We will follow with you   *   Amy Esterwood  02/15/2015, 10:15 AM  ________________________________________________________________________  Corinda [facility] MD note:  I personally  examined the patient, reviewed the data and agree with  the assessment and plan described above.  Painless red rectal bleeding in setting of 2-3 days of looser than usual stool.  Likely LGI bleeding.  Probably diverticular based on CT images.  Planning for colonoscopy tomorrow, prep today.     Rob Bunting, MD [facility] Gastroenter[facility] Pager [phone]

## 2015-02-16 ENCOUNTER — Inpatient Hospital Stay (HOSPITAL_COMMUNITY): Payer: Medicare HMO | Admitting: Anesthesiology

## 2015-02-16 ENCOUNTER — Encounter (HOSPITAL_COMMUNITY)
Admission: EM | Disposition: A | Discharge status: Home / Self Care | Payer: Self-pay | Source: Home / Self Care | Attending: Family Medicine

## 2015-02-16 ENCOUNTER — Encounter (HOSPITAL_COMMUNITY): Payer: Self-pay

## 2015-02-16 DIAGNOSIS — K5731 Diverticulosis of large intestine without perforation or abscess with bleeding: Principal | ICD-10-CM

## 2015-02-16 DIAGNOSIS — K625 Hemorrhage of anus and rectum: Secondary | ICD-10-CM | POA: Insufficient documentation

## 2015-02-16 HISTORY — PX: COLONOSCOPY WITH PROPOFOL: SHX5780

## 2015-02-16 LAB — COMPREHENSIVE METABOLIC PANEL
ALK PHOS: 49 U/L (ref 38–126)
ALT: 10 U/L — AB (ref 17–63)
ANION GAP: 7 (ref 5–15)
AST: 17 U/L (ref 15–41)
Albumin: 3.6 g/dL (ref 3.5–5.0)
BILIRUBIN TOTAL: 0.7 mg/dL (ref 0.3–1.2)
BUN: 19 mg/dL (ref 6–20)
CO2: 24 mmol/L (ref 22–32)
Calcium: 8.1 mg/dL — ABNORMAL LOW (ref 8.9–10.3)
Chloride: 107 mmol/L (ref 101–111)
Creatinine, Ser: 0.94 mg/dL (ref 0.61–1.24)
GLUCOSE: 146 mg/dL — AB (ref 65–99)
Potassium: 3.9 mmol/L (ref 3.5–5.1)
SODIUM: 138 mmol/L (ref 135–145)
TOTAL PROTEIN: 5.4 g/dL — AB (ref 6.5–8.1)

## 2015-02-16 LAB — HEMOGLOBIN AND HEMATOCRIT, BLOOD
HCT: 30.2 % — ABNORMAL LOW (ref 39.0–52.0)
HCT: 24.3 % — ABNORMAL LOW (ref 39.0–52.0)
HEMATOCRIT: 28.1 % — AB (ref 39.0–52.0)
Hemoglobin: 9.9 g/dL — ABNORMAL LOW (ref 13.0–17.0)
Hemoglobin: 9.7 g/dL — ABNORMAL LOW (ref 13.0–17.0)
Hemoglobin: 8.2 g/dL — ABNORMAL LOW (ref 13.0–17.0)

## 2015-02-16 LAB — CBC
HEMATOCRIT: 27 % — AB (ref 39.0–52.0)
Hemoglobin: 8.8 g/dL — ABNORMAL LOW (ref 13.0–17.0)
MCH: 29 pg (ref 26.0–34.0)
MCHC: 32.6 g/dL (ref 30.0–36.0)
MCV: 89.1 fL (ref 78.0–100.0)
Platelets: 123 10*3/uL — ABNORMAL LOW (ref 150–400)
RBC: 3.03 MIL/uL — AB (ref 4.22–5.81)
RDW: 13.4 % (ref 11.5–15.5)
WBC: 5.5 10*3/uL (ref 4.0–10.5)

## 2015-02-16 LAB — GLUCOSE, CAPILLARY: GLUCOSE-CAPILLARY: 133 mg/dL — AB (ref 65–99)

## 2015-02-16 LAB — PROTIME-INR
INR: 1.14 (ref 0.00–1.49)
Prothrombin Time: 14.7 seconds (ref 11.6–15.2)

## 2015-02-16 SURGERY — COLONOSCOPY WITH PROPOFOL
Anesthesia: Monitor Anesthesia Care

## 2015-02-16 MED ORDER — CHLORHEXIDINE GLUCONATE CLOTH 2 % EX PADS
6.0000 | MEDICATED_PAD | Freq: Every day | CUTANEOUS | Status: DC
  Administered 2015-02-17 – 2015-02-18 (×2): 6 via TOPICAL

## 2015-02-16 MED ORDER — PROPOFOL 10 MG/ML IV BOLUS
INTRAVENOUS | Status: DC | PRN
  Administered 2015-02-16: 10 mg via INTRAVENOUS
  Administered 2015-02-16 (×2): 20 mg via INTRAVENOUS
  Administered 2015-02-16: 30 mg via INTRAVENOUS
  Administered 2015-02-16 (×4): 10 mg via INTRAVENOUS
  Administered 2015-02-16: 30 mg via INTRAVENOUS
  Administered 2015-02-16: 10 mg via INTRAVENOUS
  Administered 2015-02-16 (×5): 20 mg via INTRAVENOUS

## 2015-02-16 MED ORDER — LACTATED RINGERS IV SOLN
INTRAVENOUS | Status: DC | PRN
  Administered 2015-02-16: 13:00:00 via INTRAVENOUS

## 2015-02-16 MED ORDER — PROPOFOL 10 MG/ML IV BOLUS
INTRAVENOUS | Status: AC
  Filled 2015-02-16: qty 20

## 2015-02-16 MED ORDER — LACTATED RINGERS IV SOLN: INTRAVENOUS | Status: DC

## 2015-02-16 MED ORDER — MUPIROCIN 2 % EX OINT
1.0000 "application " | TOPICAL_OINTMENT | Freq: Two times a day (BID) | CUTANEOUS | Status: DC
  Administered 2015-02-16 – 2015-02-18 (×4): 1 via NASAL
  Filled 2015-02-16: qty 22

## 2015-02-16 SURGICAL SUPPLY — 22 items

## 2015-02-16 NOTE — Op Note (Signed)
[facility] [address] [place], [address]   COLONOSCOPY PROCEDURE REPORT  PATIENT: [name], [name]  MR#: [id number] BIRTHDATE: 12-07-1945 , 69  yrs. old GENDER: male ENDOSCOPIST: Iva Boop, MD, [facility] PROCEDURE DATE:  02/16/2015 PROCEDURE:   Colonoscopy, diagnostic First Screening Colonoscopy - Avg.  risk and is 50 yrs.  old or older - No.  Prior Negative Screening - Now for repeat screening. N/A  History of Adenoma - Now for follow-up colonoscopy & has been > or = to 3 yrs.  N/A  Polyps removed today? No Recommend repeat exam, <10 yrs? No ASA CLASS:   Class III INDICATIONS:Evaluation of unexplained GI bleeding and Patient is not applicable for Colorectal Neoplasm Risk Assessment for this procedure. MEDICATIONS: Monitored anesthesia care and Per Anesthesia  DESCRIPTION OF PROCEDURE:   After the risks benefits and alternatives of the procedure were thoroughly explained, informed consent was obtained.  The digital rectal exam revealed no abnormalities of the rectum, revealed no prostatic nodules, and revealed the prostate was not enlarged.   The Pentax Adult Colonscope [id number]  endoscope was introduced through the anus and advanced to the cecum, which was identified by both the appendix and ileocecal valve. No adverse events experienced.   The quality of the prep was adequate (MoviPrep was used)  The instrument was then slowly withdrawn as the colon was fully examined. Estimated blood loss is zero unless otherwise noted in this procedure report.      COLON FINDINGS: There was severe diverticulosis noted throughout the entire examined colon.   The examination was otherwise normal. Retroflexed views revealed no abnormalities. The time to cecum = 5.0 Withdrawal time = 12.1   The scope was withdrawn and the procedure completed. COMPLICATIONS: There were no immediate complications.  ENDOSCOPIC IMPRESSION: 1.   Severe diverticulosis was noted throughout  the entire examined colon 2.   The examination was otherwise normal  RECOMMENDATIONS: 1.  Feed, supportive care - home when no bleeding x 48 hrs. F/u PCP - to f/u Hgb and iron repletion. See GI prn he should stop Goody Powders. 2.  If he does have more bleeding would do an EGD but clinical scenario compatible with bleeding from colonic diverticulosis which is presently stopped 3.  Colonoscopy/ colon cancer screening test  routine repeat 10 years  eSigned:  Iva Boop, MD, [facility] 02/16/2015 1:56 PM   cc: Lerry Liner, MD and The Patient

## 2015-02-16 NOTE — Anesthesia Postprocedure Evaluation (Signed)
  Anesthesia Post-op Note  Patient: [name] [name]  Procedure(s) Performed: Procedure(s) (LRB): COLONOSCOPY WITH PROPOFOL (N/A)  Patient Location: PACU  Anesthesia Type: MAC  Level of Consciousness: awake and alert   Airway and Oxygen Therapy: Patient Spontanous Breathing  Post-op Pain: mild  Post-op Assessment: Post-op Vital signs reviewed, Patient's Cardiovascular Status Stable, Respiratory Function Stable, Patent Airway and No signs of Nausea or vomiting  Last Vitals:  Filed Vitals:   02/16/15 1343  BP:   Pulse: 81  Temp:   Resp: 29    Post-op Vital Signs: stable   Complications: No apparent anesthesia complications

## 2015-02-16 NOTE — Anesthesia Preprocedure Evaluation (Signed)
Anesthesia Evaluation  Patient identified by MRN, date of birth, ID band Patient awake    Reviewed: Allergy & Precautions, H&P , NPO status , Patient's Chart, lab work & pertinent test results  History of Anesthesia Complications Negative for: history of anesthetic complications  Airway Mallampati: II  TM Distance: >3 FB Neck ROM: full    Dental no notable dental hx.    Pulmonary asthma , former smoker,  breath sounds clear to auscultation  Pulmonary exam normal       Cardiovascular hypertension, On Medications Normal cardiovascular examRhythm:regular Rate:Normal     Neuro/Psych negative neurological ROS     GI/Hepatic negative GI ROS, Neg liver ROS,   Endo/Other  diabetes, Type 2  Renal/GU Renal disease     Musculoskeletal   Abdominal   Peds  Hematology negative hematology ROS (+)   Anesthesia Other Findings   Reproductive/Obstetrics negative OB ROS                             Anesthesia Physical Anesthesia Plan  ASA: III  Anesthesia Plan: MAC   Post-op Pain Management:    Induction: Intravenous  Airway Management Planned: Nasal Cannula  Additional Equipment:   Intra-op Plan:   Post-operative Plan:   Informed Consent: I have reviewed the patients History and Physical, chart, labs and discussed the procedure including the risks, benefits and alternatives for the proposed anesthesia with the patient or authorized representative who has indicated his/her understanding and acceptance.     Plan Discussed with: Anesthesiologist, CRNA and Surgeon  Anesthesia Plan Comments: (Patient with GI bleed, Hgb >9)        Anesthesia Quick Evaluation

## 2015-02-16 NOTE — Progress Notes (Signed)
[name] [name] EAV:[id number] DOB: 07-Mar-1946 DOA: 02/15/2015 PCP: Jearld Lesch, MD  Brief narrative: 69 y/o ? h/o dm tyii, htn, severe persistent asthma foll by Pulmonary,  Gross hematuria foll by Dr. Urology with hx nephrolithiasis s/p lithotripsy 07/2009  + Ureteric stent placed 10/1/8 for stone htn Patient has a history of chronic migraine headaches and has been taking Goody powders for the past 10-12 years probably 2-3 a day Admitted with blood per rectum 3 episodes No vomit no pain in epig  Past medical history-As per Problem list Chart reviewed as below-   Consultants:  GI Gessner  Procedures:  =  Antibiotics:     Subjective  Wel, no further HA No abd pain 1 dark stool earlier today    Objective    Interim History:   Telemetry:    Objective: Filed Vitals:   02/16/15 1343 02/16/15 1350 02/16/15 1400 02/16/15 1447  BP:  104/62 131/90 112/69  Pulse: 81   75  Temp: 97.4 F (36.3 C)   97.7 F (36.5 C)  TempSrc: Oral   Oral  Resp: 29 21 28 20   Height:      Weight:      SpO2: 100% 100% 100% 100%    Intake/Output Summary (Last 24 hours) at 02/16/15 1555 Last data filed at 02/16/15 1500  Gross per 24 hour  Intake   2265 ml  Output      0 ml  Net   2265 ml    Exam:  General: eom,i, ncat looks bighter Cardiovascular: s1 s 2no m/r/g Respiratory: clear no added sound Abdomen: soft nt nd no rebound Skin no le edema Neuro intact  Data Reviewed: Basic Metabolic Panel:  Recent Labs Lab 02/15/15 0543 02/16/15 0434  NA 139 138  K 4.6 3.9  CL 106 107  CO2 25 24  GLUCOSE 108* 146*  BUN 22* 19  CREATININE 0.99 0.94  CALCIUM 8.9 8.1*   Liver Function Tests:  Recent Labs Lab 02/15/15 0543 02/16/15 0434  AST 18 17  ALT 12* 10*  ALKPHOS 61 49  BILITOT 0.8 0.7  PROT 6.5 5.4*  ALBUMIN 3.9 3.6   No results for input(s): LIPASE, AMYLASE in the last 168 hours. No results for input(s): AMMONIA in the last 168  hours. CBC:  Recent Labs Lab 02/15/15 0543  02/15/15 1423 02/15/15 1640 02/15/15 2246 02/16/15 0434 02/16/15 1015  WBC 6.7  --  6.8  --  6.8  --   --   HGB 13.7  < > 10.9* 10.7* 9.5* 9.9* 9.7*  HCT 41.6  < > 32.3* 31.2* 29.1* 30.2* 28.1*  MCV 89.1  --  88.7  --  88.7  --   --   PLT 130*  --  128*  --  136*  --   --   < > = values in this interval not displayed. Cardiac Enzymes: No results for input(s): CKTOTAL, CKMB, CKMBINDEX, TROPONINI in the last 168 hours. BNP: Invalid input(s): POCBNP CBG:  Recent Labs Lab 02/15/15 1434 02/16/15 0751  GLUCAP 163* 133*    Recent Results (from the past 240 hour(s))  MRSA PCR Screening     Status: Abnormal   Collection Time: 02/15/15 12:30 PM  Result Value Ref Range Status   MRSA by PCR POSITIVE (A) NEGATIVE Final    Comment:        The GeneXpert MRSA Assay (FDA approved for NASAL specimens only), is one component of a comprehensive MRSA colonization surveillance program. It is  not intended to diagnose MRSA infection nor to guide or monitor treatment for MRSA infections. CRITICAL RESULT CALLED TO, READ BACK BY AND VERIFIED WITH: E PELLETIER AT 1346 AT 08.07.2016 BY NBROOKS      Studies:              All Imaging reviewed and is as per above notation   Scheduled Meds: . sodium chloride   Intravenous Once  . [START ON 02/17/2015] Chlorhexidine Gluconate Cloth  6 each Topical Q0600  . ipratropium-albuterol  3 mL Nebulization BID  . metoprolol succinate  25 mg Oral Q M,W,F  . mometasone-formoterol  2 puff Inhalation BID  . mupirocin ointment  1 application Nasal BID  . potassium citrate  30 mEq Oral BID   Continuous Infusions: . 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 Rckall score for UGIB ? close supportive management for ~24-48  hours GI to advance diet when he is ready   Hypertension -cont. losartan 50 daily -Continue metoprolol Monday Wednesday Friday 25 XL  Prior nephrolithiasis -Continue potassium citrate 2 tabs twice a day  Diabetes mellitus type 2 since 2012 -No evidence of end organ damage -Need outpatient retinopathy/neuropathy screening -Would hold oral hypoglycemic agent metformin 500 twice a day for now  Severe persistent asthma -Continue mometasone 200-5 twice a day, Combivent 1 puff twice a day   migraine headaches -Only tyelnol    Appt with PCP: Requested Code Status: Full code Family Communication: family +--d/w wife in detail Disposition Plan: home likely soon-to ambulate with nursing DVT prophylaxis: SCD Consultants: GI  Pleas Koch, MD  [facility] Pager [phone] 02/16/2015, 3:55 PM    LOS: 1 day

## 2015-02-16 NOTE — Transfer of Care (Signed)
Immediate Anesthesia Transfer of Care Note  Patient: [name] [name]  Procedure(s) Performed: Procedure(s): COLONOSCOPY WITH PROPOFOL (N/A)  Patient Location: PACU  Anesthesia Type:MAC  Level of Consciousness: sedated  Airway & Oxygen Therapy: Patient Spontanous Breathing and Patient connected to nasal cannula oxygen  Post-op Assessment: Report given to RN and Post -op Vital signs reviewed and stable  Post vital signs: Reviewed and stable  Last Vitals:  Filed Vitals:   02/16/15 1303  BP: 174/78  Pulse: 116  Temp:   Resp:     Complications: No apparent anesthesia complications

## 2015-02-17 ENCOUNTER — Encounter (HOSPITAL_COMMUNITY): Payer: Self-pay | Admitting: Internal Medicine

## 2015-02-17 LAB — CBC WITH DIFFERENTIAL/PLATELET
BASOS ABS: 0 10*3/uL (ref 0.0–0.1)
Basophils Relative: 0 % (ref 0–1)
Eosinophils Absolute: 0.1 10*3/uL (ref 0.0–0.7)
Eosinophils Relative: 2 % (ref 0–5)
HCT: 25.5 % — ABNORMAL LOW (ref 39.0–52.0)
Hemoglobin: 8.4 g/dL — ABNORMAL LOW (ref 13.0–17.0)
LYMPHS PCT: 25 % (ref 12–46)
Lymphs Abs: 1.1 10*3/uL (ref 0.7–4.0)
MCH: 29.8 pg (ref 26.0–34.0)
MCHC: 32.9 g/dL (ref 30.0–36.0)
MCV: 90.4 fL (ref 78.0–100.0)
MONO ABS: 0.4 10*3/uL (ref 0.1–1.0)
MONOS PCT: 10 % (ref 3–12)
Neutro Abs: 2.9 10*3/uL (ref 1.7–7.7)
Neutrophils Relative %: 63 % (ref 43–77)
PLATELETS: 109 10*3/uL — AB (ref 150–400)
RBC: 2.82 MIL/uL — AB (ref 4.22–5.81)
RDW: 13.6 % (ref 11.5–15.5)
WBC: 4.5 10*3/uL (ref 4.0–10.5)

## 2015-02-17 LAB — GLUCOSE, CAPILLARY: GLUCOSE-CAPILLARY: 120 mg/dL — AB (ref 65–99)

## 2015-02-17 LAB — HEMOGLOBIN AND HEMATOCRIT, BLOOD
HCT: 24.8 % — ABNORMAL LOW (ref 39.0–52.0)
HEMOGLOBIN: 8.4 g/dL — AB (ref 13.0–17.0)

## 2015-02-17 NOTE — Progress Notes (Addendum)
    Progress Note   Subjective  loose BM today but no blood. Ate breakfast.    Objective   Vital signs in last 24 hours: Temp:  [97.4 F (36.3 C)-98.4 F (36.9 C)] 98.4 F (36.9 C) (08/09 0454) Pulse Rate:  [75-116] 80 (08/09 0454) Resp:  [20-29] 20 (08/09 0454) BP: (102-174)/(51-90) 118/68 mmHg (08/09 0454) SpO2:  [100 %] 100 % (08/09 0822) Weight:  [142 lb (64.411 kg)] 142 lb (64.411 kg) (08/08 1254) Last BM Date: 02/16/15 General:    white male in NAD Heart:  Regular rate and rhythm Abdomen:  Soft, nontender and nondistended. Normal bowel sounds. Extremities:  Without edema. Neurologic:  Alert and oriented,  grossly normal neurologically. Psych:  Cooperative. Normal mood and affect.    Lab Results:  Recent Labs  02/15/15 2246  02/16/15 1628 02/16/15 2012 02/17/15 0445  WBC 6.8  --  5.5  --  4.5  HGB 9.5*  < > 8.8* 8.2* 8.4*  HCT 29.1*  < > 27.0* 24.3* 25.5*  PLT 136*  --  123*  --  109*  < > = values in this interval not displayed. BMET    Assessment / Plan:   13. 69 year old male with GI bleed, presumed diverticular hemorrhage. No further bleeding. Hopefully home in am if doesn't bleed again  2. Anemia of acute blood loss. Hgb stable overnight at 8.4. He has not required blood    LOS: 2 days   Willette Cluster  02/17/2015, 9:02 AM    GI Attending  I have also seen and assessed the patient and agree with the advanced practitioner's assessment and plan. He is improved and stable. As long as no sig bleeding through tomorrow he can go home.  Does not need GI follow-up per se can see PCP and have Hgb checked, iron supplemented.  Should stay on a PPI and stop Goody powders.   Thanks  Signing off.  Call back if ?  Iva Boop, MD, [facility] [phone] (pager) 02/17/2015 12:29 PM

## 2015-02-17 NOTE — Progress Notes (Signed)
FATE CASTER ZOX:[id number] DOB: [birth year] DOA: 02/15/2015 PCP: Jearld Lesch, MD  Brief narrative: 69 y/o ? h/o dm tyii, htn, severe persistent asthma foll by Pulmonary,  Gross hematuria foll by Dr. Urology with hx nephrolithiasis s/p lithotripsy 07/2009  + Ureteric stent placed 10/1/8 for stone htn Patient has a history of chronic migraine headaches and has been taking Goody powders for the past 10-12 years probably 2-3 a day Admitted with blood per rectum 3 episodes No vomit no pain in epig  Past medical history-As per Problem list Chart reviewed as below-   Consultants:  GI Gessner  Procedures:  =  Antibiotics:     Subjective   Fair No nn/v Had nl stool this am tol diet well No cp   Objective    Interim History:   Telemetry:    Objective: Filed Vitals:   02/17/15 0454 02/17/15 0822 02/17/15 1434 02/17/15 1748  BP: 118/68  112/90   Pulse: 80  81   Temp: 98.4 F (36.9 C)  98.1 F (36.7 C)   TempSrc: Oral  Oral   Resp: 20  20   Height:      Weight:      SpO2: 100% 100% 99% 98%    Intake/Output Summary (Last 24 hours) at 02/17/15 1750 Last data filed at 02/17/15 1500  Gross per 24 hour  Intake   1680 ml  Output      0 ml  Net   1680 ml    Exam:  General: eomi, ncat looks bighter Cardiovascular: s1 s 2no m/r/g Respiratory: clear no added sound Abdomen: soft nt nd no rebound Skin no le edema Neuro intact  Data Reviewed: Basic Metabolic Panel:  Recent Labs Lab 02/15/15 0543 02/16/15 0434  NA 139 138  K 4.6 3.9  CL 106 107  CO2 25 24  GLUCOSE 108* 146*  BUN 22* 19  CREATININE 0.99 0.94  CALCIUM 8.9 8.1*   Liver Function Tests:  Recent Labs Lab 02/15/15 0543 02/16/15 0434  AST 18 17  ALT 12* 10*  ALKPHOS 61 49  BILITOT 0.8 0.7  PROT 6.5 5.4*  ALBUMIN 3.9 3.6   No results for input(s): LIPASE, AMYLASE in the last 168 hours. No results for input(s): AMMONIA in the last 168 hours. CBC:  Recent Labs Lab  02/15/15 0543  02/15/15 1423  02/15/15 2246  02/16/15 1015 02/16/15 1628 02/16/15 2012 02/17/15 0445 02/17/15 1049  WBC 6.7  --  6.8  --  6.8  --   --  5.5  --  4.5  --   NEUTROABS  --   --   --   --   --   --   --   --   --  2.9  --   HGB 13.7  < > 10.9*  < > 9.5*  < > 9.7* 8.8* 8.2* 8.4* 8.4*  HCT 41.6  < > 32.3*  < > 29.1*  < > 28.1* 27.0* 24.3* 25.5* 24.8*  MCV 89.1  --  88.7  --  88.7  --   --  89.1  --  90.4  --   PLT 130*  --  128*  --  136*  --   --  123*  --  109*  --   < > = values in this interval not displayed. Cardiac Enzymes: No results for input(s): CKTOTAL, CKMB, CKMBINDEX, TROPONINI in the last 168 hours. BNP: Invalid input(s): POCBNP CBG:  Recent Labs Lab 02/15/15 1434 02/16/15  0751 02/17/15 0813  GLUCAP 163* 133* 120*    Recent Results (from the past 240 hour(s))  MRSA PCR Screening     Status: Abnormal   Collection Time: 02/15/15 12:30 PM  Result Value Ref Range Status   MRSA by PCR POSITIVE (A) NEGATIVE Final    Comment:        The GeneXpert MRSA Assay (FDA approved for NASAL specimens only), is one component of a comprehensive MRSA colonization surveillance program. It is not intended to diagnose MRSA infection nor to guide or monitor treatment for MRSA infections. CRITICAL RESULT CALLED TO, READ BACK BY AND VERIFIED WITH: E PELLETIER AT 1346 AT 08.07.2016 BY NBROOKS      Studies:              All Imaging reviewed and is as per above notation   Scheduled Meds: . sodium chloride   Intravenous Once  . Chlorhexidine Gluconate Cloth  6 each Topical Q0600  . ipratropium-albuterol  3 mL Nebulization BID  . metoprolol succinate  25 mg Oral Q M,W,F  . mometasone-formoterol  2 puff Inhalation BID  . mupirocin ointment  1 application Nasal BID  . potassium citrate  30 mEq Oral BID   Continuous Infusions: . dextrose 5 % and 0.45% NaCl 50 mL/hr at 02/17/15 0454     Assessment/Plan:   Diverticular bleed with severe diveticulosis on  Colonoscopy   -Cycle blood count every 24 h -appreciate GI input ?? Rockall score for UGIB ? close supportive management for ~24 hours as per Dr. Leone Payor GI to advance diet from soft -saline lock IV  Hypertension -cont. losartan 50 daily -Continue metoprolol Monday Wednesday Friday 25 XL  Prior nephrolithiasis -Continue potassium citrate 2 tabs twice a day  Diabetes mellitus type 2 since 2012 -No evidence of end organ damage -Need outpatient retinopathy/neuropathy screening -Would hold oral hypoglycemic agent metformin 500 twice a day for now  Severe persistent asthma -Continue mometasone 200-5 twice a day, Combivent 1 puff twice a day   migraine headaches -Only tylenol; -explained no need for Goody's in the future,BUt to drink some coffee to mitigate caffeine withdrawal Headaches    Appt with PCP: Requested Code Status: Full code Family Communication: family +--d/w wife in detail Disposition Plan: home likely soon-to ambulate with nursing DVT prophylaxis: SCD Consultants: GI  Pleas Koch, MD  [facility] Pager [phone] 02/17/2015, 5:50 PM    LOS: 2 days

## 2015-02-17 NOTE — Clinical Documentation Improvement (Signed)
MD's, NP's, and PA's  Documentation of "CKD" for greater specificity please provide stage.  Thank you  Possible Clinical Conditions?   CKD Stage I - GFR > OR = 90 CKD Stage II - GFR 60-80 CKD Stage III - GFR 30-59 Other condition Cannot Clinically determine     Risk Factors: HTN, DM 2  Diagnostics: GFR  >60, >60  Treatment: evaluated   Thank You, Lavonda Jumbo ,RN Clinical Documentation Specialist:  [phone]  [facility]- Health Information Management

## 2015-02-17 NOTE — Care Management Important Message (Signed)
Important Message  Patient Details IM Letter given to Cookie? Case Manager to present to patientImportant Message  Patient Details  Name: [name] [name] MRN: [id number] Date of Birth: 1946/06/19   Medicare Important Message Given:  Yes-second notification given    Haskell Flirt 02/17/2015, 12:08 PM Name: [name] [name] MRN: [id number] Date of Birth: [birth year]   Medicare Important Message Given:  Yes-second notification given    Haskell Flirt 02/17/2015, 12:07 PM

## 2015-02-18 DIAGNOSIS — I10 Essential (primary) hypertension: Secondary | ICD-10-CM

## 2015-02-18 DIAGNOSIS — T39395A Adverse effect of other nonsteroidal anti-inflammatory drugs [NSAID], initial encounter: Secondary | ICD-10-CM

## 2015-02-18 DIAGNOSIS — E118 Type 2 diabetes mellitus with unspecified complications: Secondary | ICD-10-CM

## 2015-02-18 LAB — CBC WITH DIFFERENTIAL/PLATELET
BASOS ABS: 0 10*3/uL (ref 0.0–0.1)
Basophils Relative: 0 % (ref 0–1)
Eosinophils Absolute: 0.2 10*3/uL (ref 0.0–0.7)
Eosinophils Relative: 3 % (ref 0–5)
HCT: 25.4 % — ABNORMAL LOW (ref 39.0–52.0)
Hemoglobin: 8.4 g/dL — ABNORMAL LOW (ref 13.0–17.0)
Lymphocytes Relative: 26 % (ref 12–46)
Lymphs Abs: 1.2 10*3/uL (ref 0.7–4.0)
MCH: 29.9 pg (ref 26.0–34.0)
MCHC: 33.1 g/dL (ref 30.0–36.0)
MCV: 90.4 fL (ref 78.0–100.0)
Monocytes Absolute: 0.3 10*3/uL (ref 0.1–1.0)
Monocytes Relative: 7 % (ref 3–12)
NEUTROS PCT: 63 % (ref 43–77)
Neutro Abs: 2.9 10*3/uL (ref 1.7–7.7)
Platelets: 106 10*3/uL — ABNORMAL LOW (ref 150–400)
RBC: 2.81 MIL/uL — ABNORMAL LOW (ref 4.22–5.81)
RDW: 13.3 % (ref 11.5–15.5)
WBC: 4.6 10*3/uL (ref 4.0–10.5)

## 2015-02-18 LAB — GLUCOSE, CAPILLARY: Glucose-Capillary: 114 mg/dL — ABNORMAL HIGH (ref 65–99)

## 2015-02-18 MED ORDER — MUPIROCIN 2 % EX OINT: 1.0000 "application " | TOPICAL_OINTMENT | Freq: Two times a day (BID) | CUTANEOUS | Status: DC

## 2015-02-18 NOTE — Progress Notes (Addendum)
Per secretary: PCP does not schedule appointments, first come first serve.  Will explain importance of following up with PCP.  Went over AVS summary with patient and wife, explained importance of taking mediations as prescribed.  VSS.  Pt will be wheeled out by NT.

## 2015-02-18 NOTE — Plan of Care (Signed)
Problem: Food- and Nutrition-Related Knowledge Deficit (NB-1.1) Goal: Nutrition education Formal process to instruct or train a patient/client in a skill or to impart knowledge to help patients/clients voluntarily manage or modify food choices and eating behavior to maintain or improve health. Outcome: Completed/Met Date Met:  02/18/15 Nutrition Education Note  RD consulted via RN for nutrition education regarding a low fiber diet.  RD provided "Low Fiber Nutrition Therapy" handout from the [employer]. Reviewed patient's dietary recall. Reviewed low and high fiber foods. Discouraged intake of processed foods. Explained how to slowly increase his fiber intake.  Teach back method used.  Expect good compliance.  Body mass index is 22.24 kg/(m^2). Pt meets criteria for normal range based on current BMI.  Current diet order is soft, patient is consuming approximately 50% of meals at this time. Labs and medications reviewed. No further nutrition interventions warranted at this time. If additional nutrition issues arise, please re-consult RD.  Clayton Bibles, MS, RD, LDN Pager: [phone] After Hours Pager: [phone]

## 2015-02-18 NOTE — Discharge Summary (Signed)
Physician Discharge Summary  [name] [name] ZOX:[id number] DOB: June 26, 1946 DOA: 02/15/2015  PCP: Jearld Lesch, MD  Admit date: 02/15/2015 Discharge date: 02/18/2015  Time spent: 45 minutes  Recommendations for Outpatient Follow-up:  Patient will be discharged to home.  Patient will need to follow up with primary care provider within one week of discharge and have repeat CBC.  Patient should continue medications as prescribed.  Patient should follow a soft diet and advance to heart healthy/carb modified  Discharge Diagnoses:  Lower GI bleeding/diverticular bleeding/anemia secondary to blood loss Hypertension Prior nephrolithiasis Diabetes mellitus, type II Persistent asthma Migraine headaches  Discharge Condition: Stable  Diet recommendation: Soft diet and advance to heart healthy/carb modified  Filed Weights   02/15/15 0448 02/15/15 1030 02/16/15 1254  Weight: 65.772 kg (145 lb) 64.456 kg (142 lb 1.6 oz) 64.411 kg (142 lb)    History of present illness:  On 02/15/2015 by Dr. Pleas Koch 69 y/o ? h/o dm tyii, htn, severe persistent asthma foll by Pulmonary, Gross hematuria foll by Dr. Urology with hx nephrolithiasis s/p lithotripsy 07/2009 + Ureteric stent placed 10/1/8 for stone Htn.  Patient has a history of chronic migraine headaches and has been taking Goody powders for the past 10-12 years probably 2-3 a day, States that on 02/11/15 he and his wife both started developing diarrhea and malaise after eating some chicken. Noted that he has been having 3-4 semi-formed stools a day however on the evening 02/14/15 he developed dark stool and BRB per rectum. Stool was not performed. He has had some nausea and some abdominal pain associated with this in the upper abdomen as well. He has had no hematemesis, He has no icterus, He has not been on antibiotics recently.  He does not have any chest pain, shortness of breath, chills, right ear, fever, unilateral weakness, orthostasis, dizziness,  rash, other illness. Emergency room workup revealed hemoglobin 13.7 with a baseline of 14.4, Platelets 130, BUN/creatinine 22/0.99, Lactic acid 1.3. CT abdomen pelvis = moderate diverticulosis sigmoid and descending colon? Cause, bilateral nephrolithiasis without any obstruction, moderate atherosclerosis  Hospital Course:  Lower GI Bleeding/ Diverticular bleeding/ anemia secondary to blood loss -Gastroenterology consulted and appreciated -Patient had colonoscopy showing severe diverticulosis. -Gastroenterology recommended discontinuation of the powders, repeat colonoscopy/colon cancer screening routine 10 years -Hemoglobin currently stable, 8.4 -Repeat CBC in 1 week  Hypertension -Continue losartan and metoprolol  Prior nephrolithiasis -Continue potassium citrate  Diabetes mellitus, type II -Continue metformin upon discharge  Severe persistent asthma -Continue mometasone, Combivent  Migraine headaches -Discontinued Goody powder; patient advised to avoid NSAIDs -Possibly related to caffeine withdrawal headaches -Continue Tylenol  Procedures: Colonoscopy: Severe diverticulosis  Consultations: Gastroenterology  Discharge Exam: Filed Vitals:   02/18/15 0458  BP: 121/62  Pulse: 90  Temp: 98.6 F (37 C)  Resp: 18     General: Well developed, well nourished, NAD, appears stated age  HEENT: NCAT,  mucous membranes moist.  Cardiovascular: S1 S2 auscultated, RRR.  Respiratory: Clear to auscultation bilaterally with equal chest rise  Abdomen: Soft, nontender, nondistended, + bowel sounds  Extremities: warm dry without cyanosis clubbing or edema  Neuro: AAOx3, nonfocal  Psych: Normal affect and demeanor with intact judgement and insight  Discharge Instructions      Discharge Instructions    Discharge instructions    Complete by:  As directed   Patient will be discharged to home.  Patient will need to follow up with primary care provider within one week of  discharge and have repeat  CBC.  Patient should continue medications as prescribed.  Patient should follow a soft diet and advance to heart healthy/carb modified            Medication List    STOP taking these medications        BENICAR 20 MG tablet  Generic drug:  olmesartan     CITRUS CALCIUM 1500 + D PO     GOODY HEADACHE PO      TAKE these medications        albuterol (2.5 MG/3ML) 0.083% nebulizer solution  Commonly known as:  PROVENTIL  Take 2.5 mg by nebulization as needed. Wheezing and shortness of breath     butalbital-acetaminophen-caffeine 50-325-40 MG per tablet  Commonly known as:  FIORICET, ESGIC  Take 1 tablet by mouth 2 (two) times daily as needed. migraines     COMBIVENT RESPIMAT 20-100 MCG/ACT Aers respimat  Generic drug:  Ipratropium-Albuterol  Inhale 1 puff into the lungs 2 (two) times daily.     Copper Gluconate 2 MG Tabs  Take 1 tablet by mouth daily.     losartan 50 MG tablet  Commonly known as:  COZAAR  Take 50 mg by mouth daily.     magnesium gluconate 500 MG tablet  Commonly known as:  MAGONATE  Take 500 mg by mouth daily.     metFORMIN 500 MG tablet  Commonly known as:  GLUCOPHAGE  Take 500 mg by mouth 2 (two) times daily.     metoprolol succinate 25 MG 24 hr tablet  Commonly known as:  TOPROL-XL  Take 25 mg by mouth every Monday, Wednesday, and Friday.     mometasone-formoterol 200-5 MCG/ACT Aero  Commonly known as:  DULERA  Inhale 2 puffs into the lungs 2 (two) times daily.     mupirocin ointment 2 %  Commonly known as:  BACTROBAN  Place 1 application into the nose 2 (two) times daily. Apply for an additional 3 days     predniSONE 20 MG tablet  Commonly known as:  DELTASONE  Take 20 mg by mouth daily as needed. As needed for when he has difficulty breathing     Selenium 200 MCG Tabs  Take 1 tablet by mouth daily.     UROCIT-K 15 15 MEQ (1620 MG) Tbcr  Generic drug:  Potassium Citrate  Take 2 tablets by mouth 2 (two) times  daily.       Allergies  Allergen Reactions  . Hydrocodone Shortness Of Breath  . Percocet [Oxycodone-Acetaminophen] Shortness Of Breath  . Flu Virus Vaccine Nausea And Vomiting    REACTION: egg allergy  . Penicillins Other (See Comments)    Passes out   Follow-up Information    Follow up with Jearld Lesch, MD. Schedule an appointment as soon as possible for a visit in 1 week.   Specialty:  Specialist   Why:  Hospital follow up   Contact information:   [address] [place] [address] [phone]        The results of significant diagnostics from this hospitalization (including imaging, microbiology, ancillary and laboratory) are listed below for reference.    Significant Diagnostic Studies: Ct Abdomen Pelvis W Contrast  02/15/2015   CLINICAL DATA:  Bright red bleeding per rectum today. Initial encounter.  EXAM: CT ABDOMEN AND PELVIS WITH CONTRAST  TECHNIQUE: Multidetector CT imaging of the abdomen and pelvis was performed using the standard protocol following bolus administration of intravenous contrast.  CONTRAST:  50mL OMNIPAQUE IOHEXOL 300 MG/ML  SOLN, OMNIPAQUE IOHEXOL 300 MG/ML SOLN  COMPARISON:  CT 07/18/2012.  FINDINGS: Lower chest: Clear lung bases. No significant pleural or pericardial effusion. Possible mild distal esophageal wall thickening without significant hiatal hernia.  Hepatobiliary: The liver is normal in density without focal abnormality. No evidence of gallstones, gallbladder wall thickening or biliary dilatation.  Pancreas: Unremarkable. No pancreatic ductal dilatation or surrounding inflammatory changes.  Spleen: Normal in size without focal abnormality.  Adrenals/Urinary Tract: Both adrenal glands appear normal.The right kidney demonstrates a tiny nonobstructing calculus in its interpolar region and mild cortical scarring in its upper pole. Mild prominence of the right collecting system is stable without evidence of ureteral calculus. The left  kidney demonstrates more extensive cortical thinning and scarring with nonobstructing calculi in its lower pole measuring up to 10 mm in diameter on image 32. No evidence of renal mass. The bladder appears unremarkable.  Stomach/Bowel: No evidence of bowel wall thickening, distention or surrounding inflammatory change.There are moderate diverticular changes throughout the descending and sigmoid colon. The rectum is moderately distended with liquid stool. The appendix appears normal.  Vascular/Lymphatic: There are no enlarged abdominal or pelvic lymph nodes. Moderate aortoiliac atherosclerosis. Retrocardiac left renal vein noted.  Reproductive: Unremarkable.  Other: No evidence of abdominal wall mass or hernia.  Musculoskeletal: No acute or significant osseous findings. Stable bone island within the L5 vertebral body.  IMPRESSION: 1. No clear explanation for rectal bleeding. There is moderate diverticulosis of the sigmoid and descending colon, a probable cause for rectal bleeding. 2. Bilateral nephrolithiasis with renal cortical scarring and asymmetric atrophy on the left. No evidence of ureteral calculus or collecting system obstruction. 3. Moderate atherosclerosis.   Electronically Signed   By: Carey Bullocks M.D.   On: 02/15/2015 07:56    Microbiology: Recent Results (from the past 240 hour(s))  MRSA PCR Screening     Status: Abnormal   Collection Time: 02/15/15 12:30 PM  Result Value Ref Range Status   MRSA by PCR POSITIVE (A) NEGATIVE Final    Comment:        The GeneXpert MRSA Assay (FDA approved for NASAL specimens only), is one component of a comprehensive MRSA colonization surveillance program. It is not intended to diagnose MRSA infection nor to guide or monitor treatment for MRSA infections. CRITICAL RESULT CALLED TO, READ BACK BY AND VERIFIED WITH: E PELLETIER AT 1346 AT 08.07.2016 BY NBROOKS      Labs: Basic Metabolic Panel:  Recent Labs Lab 02/15/15 0543 02/16/15 0434  NA  139 138  K 4.6 3.9  CL 106 107  CO2 25 24  GLUCOSE 108* 146*  BUN 22* 19  CREATININE 0.99 0.94  CALCIUM 8.9 8.1*   Liver Function Tests:  Recent Labs Lab 02/15/15 0543 02/16/15 0434  AST 18 17  ALT 12* 10*  ALKPHOS 61 49  BILITOT 0.8 0.7  PROT 6.5 5.4*  ALBUMIN 3.9 3.6   No results for input(s): LIPASE, AMYLASE in the last 168 hours. No results for input(s): AMMONIA in the last 168 hours. CBC:  Recent Labs Lab 02/15/15 1423  02/15/15 2246  02/16/15 1628 02/16/15 2012 02/17/15 0445 02/17/15 1049 02/18/15 0439  WBC 6.8  --  6.8  --  5.5  --  4.5  --  4.6  NEUTROABS  --   --   --   --   --   --  2.9  --  2.9  HGB 10.9*  < > 9.5*  < > 8.8* 8.2* 8.4* 8.4*  8.4*  HCT 32.3*  < > 29.1*  < > 27.0* 24.3* 25.5* 24.8* 25.4*  MCV 88.7  --  88.7  --  89.1  --  90.4  --  90.4  PLT 128*  --  136*  --  123*  --  109*  --  106*  < > = values in this interval not displayed. Cardiac Enzymes: No results for input(s): CKTOTAL, CKMB, CKMBINDEX, TROPONINI in the last 168 hours. BNP: BNP (last 3 results) No results for input(s): BNP in the last 8760 hours.  ProBNP (last 3 results) No results for input(s): PROBNP in the last 8760 hours.  CBG:  Recent Labs Lab 02/15/15 1434 02/16/15 0751 02/17/15 0813 02/18/15 0750  GLUCAP 163* 133* 120* 114*       Signed:  Cutberto Winfree  [facility] 02/18/2015, 10:06 AM

## 2015-02-18 NOTE — Discharge Instructions (Signed)
Diverticulosis Diverticulosis is the condition that develops when small pouches (diverticula) form in the wall of your colon. Your colon, or large intestine, is where water is absorbed and stool is formed. The pouches form when the inside layer of your colon pushes through weak spots in the outer layers of your colon. CAUSES  No one knows exactly what causes diverticulosis. RISK FACTORS  Being older than 50. Your risk for this condition increases with age. Diverticulosis is rare in people younger than 40 years. By age 14, almost everyone has it.  Eating a low-fiber diet.  Being frequently constipated.  Being overweight.  Not getting enough exercise.  Smoking.  Taking over-the-counter pain medicines, like aspirin and ibuprofen. SYMPTOMS  Most people with diverticulosis do not have symptoms. DIAGNOSIS  Because diverticulosis often has no symptoms, health care providers often discover the condition during an exam for other colon problems. In many cases, a health care provider will diagnose diverticulosis while using a flexible scope to examine the colon (colonoscopy). TREATMENT  If you have never developed an infection related to diverticulosis, you may not need treatment. If you have had an infection before, treatment may include:  Eating more fruits, vegetables, and grains.  Taking a fiber supplement.  Taking a live bacteria supplement (probiotic).  Taking medicine to relax your colon. HOME CARE INSTRUCTIONS  Bloody Stools Bloody stools means there is blood in your poop (stool). It is a sign that there is a problem somewhere in the digestive system. It is important for your doctor to find the cause of your bleeding, so the problem can be treated.  HOME CARE  Only take medicine as told by your doctor.  Eat foods with fiber (prunes, bran cereals).  Drink enough fluids to keep your pee (urine) clear or pale yellow.  Sit in warm water (sitz bath) for 10 to 15 minutes as told by  your doctor.  Know how to take your medicines (enemas, suppositories) if advised by your doctor.  Watch for signs that you are getting better or getting worse. GET HELP RIGHT AWAY IF:   You are not getting better.  You start to get better but then get worse again.  You have new problems.  You have severe bleeding from the place where poop comes out (rectum) that does not stop.  You throw up (vomit) blood.  You feel weak or pass out (faint).  You have a fever. MAKE SURE YOU:   Understand these instructions.  Will watch your condition.  Will get help right away if you are not doing well or get worse. Document Released: 06/15/2009 Document Revised: 09/19/2011 Document Reviewed: 11/12/2010 [facility] Patient Information 2015 [place], [place]. This information is not intended to replace advice given to you by your health care provider. Make sure you discuss any questions you have with your health care provider.   Drink at least 6-8 glasses of water each day to prevent constipation.  Try not to strain when you have a bowel movement.  Keep all follow-up appointments. If you have had an infection before:  Increase the fiber in your diet as directed by your health care provider or dietitian.  Take a dietary fiber supplement if your health care provider approves.  Only take medicines as directed by your health care provider. SEEK MEDICAL CARE IF:   You have abdominal pain.  You have bloating.  You have cramps.  You have not gone to the bathroom in 3 days. SEEK IMMEDIATE MEDICAL CARE IF:  Your pain gets worse.  Yourbloating becomes very bad.  You have a fever or chills, and your symptoms suddenly get worse.  You begin vomiting.  You have bowel movements that are bloody or black. MAKE SURE YOU:  Understand these instructions.  Will watch your condition.  Will get help right away if you are not doing well or get worse. Document Released: 03/24/2004 Document  Revised: 07/02/2013 Document Reviewed: 05/22/2013 [facility] Patient Information 2015 [place], [place]. This information is not intended to replace advice given to you by your health care provider. Make sure you discuss any questions you have with your health care provider.

## 2015-08-05 DIAGNOSIS — R5382 Chronic fatigue, unspecified: Secondary | ICD-10-CM | POA: Diagnosis not present

## 2015-08-05 DIAGNOSIS — E119 Type 2 diabetes mellitus without complications: Secondary | ICD-10-CM | POA: Diagnosis not present

## 2015-08-05 DIAGNOSIS — J45998 Other asthma: Secondary | ICD-10-CM | POA: Diagnosis not present

## 2015-08-05 DIAGNOSIS — I10 Essential (primary) hypertension: Secondary | ICD-10-CM | POA: Diagnosis not present

## 2016-07-11 DIAGNOSIS — I82409 Acute embolism and thrombosis of unspecified deep veins of unspecified lower extremity: Secondary | ICD-10-CM

## 2016-07-11 HISTORY — DX: Acute embolism and thrombosis of unspecified deep veins of unspecified lower extremity: I82.409

## 2016-07-11 HISTORY — PX: REPLACEMENT TOTAL KNEE: SUR1224

## 2016-07-26 DIAGNOSIS — Z01118 Encounter for examination of ears and hearing with other abnormal findings: Secondary | ICD-10-CM | POA: Diagnosis not present

## 2016-07-26 DIAGNOSIS — Z136 Encounter for screening for cardiovascular disorders: Secondary | ICD-10-CM | POA: Diagnosis not present

## 2016-07-26 DIAGNOSIS — Z131 Encounter for screening for diabetes mellitus: Secondary | ICD-10-CM | POA: Diagnosis not present

## 2016-07-26 DIAGNOSIS — J4530 Mild persistent asthma, uncomplicated: Secondary | ICD-10-CM | POA: Diagnosis not present

## 2016-07-26 DIAGNOSIS — Z0000 Encounter for general adult medical examination without abnormal findings: Secondary | ICD-10-CM | POA: Diagnosis not present

## 2016-07-26 DIAGNOSIS — H538 Other visual disturbances: Secondary | ICD-10-CM | POA: Diagnosis not present

## 2016-07-26 DIAGNOSIS — Z8719 Personal history of other diseases of the digestive system: Secondary | ICD-10-CM | POA: Diagnosis not present

## 2016-07-26 DIAGNOSIS — E1165 Type 2 diabetes mellitus with hyperglycemia: Secondary | ICD-10-CM | POA: Diagnosis not present

## 2016-07-26 DIAGNOSIS — I10 Essential (primary) hypertension: Secondary | ICD-10-CM | POA: Diagnosis not present

## 2016-07-26 DIAGNOSIS — Z87442 Personal history of urinary calculi: Secondary | ICD-10-CM | POA: Diagnosis not present

## 2016-07-27 DIAGNOSIS — R69 Illness, unspecified: Secondary | ICD-10-CM | POA: Diagnosis not present

## 2016-07-30 DIAGNOSIS — R69 Illness, unspecified: Secondary | ICD-10-CM | POA: Diagnosis not present

## 2016-08-05 DIAGNOSIS — I10 Essential (primary) hypertension: Secondary | ICD-10-CM | POA: Diagnosis not present

## 2016-08-05 DIAGNOSIS — I119 Hypertensive heart disease without heart failure: Secondary | ICD-10-CM | POA: Diagnosis not present

## 2016-08-23 DIAGNOSIS — R69 Illness, unspecified: Secondary | ICD-10-CM | POA: Diagnosis not present

## 2016-08-23 DIAGNOSIS — Z8669 Personal history of other diseases of the nervous system and sense organs: Secondary | ICD-10-CM | POA: Diagnosis not present

## 2016-08-23 DIAGNOSIS — J45909 Unspecified asthma, uncomplicated: Secondary | ICD-10-CM | POA: Diagnosis not present

## 2016-08-23 DIAGNOSIS — Z87442 Personal history of urinary calculi: Secondary | ICD-10-CM | POA: Diagnosis not present

## 2016-08-23 DIAGNOSIS — Z8711 Personal history of peptic ulcer disease: Secondary | ICD-10-CM | POA: Diagnosis not present

## 2016-08-23 DIAGNOSIS — Z8719 Personal history of other diseases of the digestive system: Secondary | ICD-10-CM | POA: Diagnosis not present

## 2016-08-23 DIAGNOSIS — I10 Essential (primary) hypertension: Secondary | ICD-10-CM | POA: Diagnosis not present

## 2016-08-23 DIAGNOSIS — Z0000 Encounter for general adult medical examination without abnormal findings: Secondary | ICD-10-CM | POA: Diagnosis not present

## 2016-08-23 DIAGNOSIS — E1165 Type 2 diabetes mellitus with hyperglycemia: Secondary | ICD-10-CM | POA: Diagnosis not present

## 2016-08-23 DIAGNOSIS — Z23 Encounter for immunization: Secondary | ICD-10-CM | POA: Diagnosis not present

## 2016-08-23 DIAGNOSIS — J4530 Mild persistent asthma, uncomplicated: Secondary | ICD-10-CM | POA: Diagnosis not present

## 2016-09-08 DIAGNOSIS — I10 Essential (primary) hypertension: Secondary | ICD-10-CM | POA: Diagnosis not present

## 2016-09-08 DIAGNOSIS — Z0000 Encounter for general adult medical examination without abnormal findings: Secondary | ICD-10-CM | POA: Diagnosis not present

## 2016-09-08 DIAGNOSIS — J4530 Mild persistent asthma, uncomplicated: Secondary | ICD-10-CM | POA: Diagnosis not present

## 2016-09-08 DIAGNOSIS — E1165 Type 2 diabetes mellitus with hyperglycemia: Secondary | ICD-10-CM | POA: Diagnosis not present

## 2016-09-08 DIAGNOSIS — Z8719 Personal history of other diseases of the digestive system: Secondary | ICD-10-CM | POA: Diagnosis not present

## 2016-09-08 DIAGNOSIS — Z8711 Personal history of peptic ulcer disease: Secondary | ICD-10-CM | POA: Diagnosis not present

## 2016-09-08 DIAGNOSIS — Z8669 Personal history of other diseases of the nervous system and sense organs: Secondary | ICD-10-CM | POA: Diagnosis not present

## 2016-09-08 DIAGNOSIS — Z87442 Personal history of urinary calculi: Secondary | ICD-10-CM | POA: Diagnosis not present

## 2016-10-20 DIAGNOSIS — M25561 Pain in right knee: Secondary | ICD-10-CM | POA: Diagnosis not present

## 2016-10-20 DIAGNOSIS — G8929 Other chronic pain: Secondary | ICD-10-CM | POA: Diagnosis not present

## 2016-10-20 DIAGNOSIS — M1711 Unilateral primary osteoarthritis, right knee: Secondary | ICD-10-CM | POA: Diagnosis not present

## 2016-10-27 ENCOUNTER — Encounter (HOSPITAL_COMMUNITY): Payer: Self-pay | Admitting: *Deleted

## 2016-11-01 DIAGNOSIS — E1165 Type 2 diabetes mellitus with hyperglycemia: Secondary | ICD-10-CM | POA: Diagnosis not present

## 2016-11-01 DIAGNOSIS — Z87891 Personal history of nicotine dependence: Secondary | ICD-10-CM | POA: Diagnosis not present

## 2016-11-01 DIAGNOSIS — I119 Hypertensive heart disease without heart failure: Secondary | ICD-10-CM | POA: Diagnosis not present

## 2016-11-01 DIAGNOSIS — I10 Essential (primary) hypertension: Secondary | ICD-10-CM | POA: Diagnosis not present

## 2016-11-01 DIAGNOSIS — J4530 Mild persistent asthma, uncomplicated: Secondary | ICD-10-CM | POA: Diagnosis not present

## 2016-11-02 ENCOUNTER — Encounter (HOSPITAL_COMMUNITY): Payer: Self-pay

## 2016-11-02 NOTE — Patient Instructions (Signed)
[name] [name]  11/02/2016   Your procedure is scheduled on: 11/14/2016    Report to [facility] Main  Entranceand report to Admitting at 0930am.        Call this number if you have problems the morning of surgery  [phone]   Remember: ONLY 1 PERSON MAY GO WITH YOU TO SHORT STAY TO GET  READY MORNING OF YOUR SURGERY.  Do not eat food or drink liquids :After Midnight.     Take these medicines the morning of surgery with A SIP OF WATER:  DO NOT TAKE ANY DIABETIC MEDICATIONS DAY OF YOUR SURGERY                               You may not have any metal on your body including hair pins and              piercings  Do not wear jewelry, , lotions, powders or perfumes, deodorant                           Men may shave face and neck.   Do not bring valuables to the hospital.  IS NOT             RESPONSIBLE   FOR VALUABLES.  Contacts, dentures or bridgework may not be worn into surgery.  Leave suitcase in the car. After surgery it may be brought to your room.      S              Please read over the following fact sheets you were given: _____________________________________________________________________             [facility] - Preparing for Surgery Before surgery, you can play an important role.  Because skin is not sterile, your skin needs to be as free of germs as possible.  You can reduce the number of germs on your skin by washing with CHG (chlorahexidine gluconate) soap before surgery.  CHG is an antiseptic cleaner which kills germs and bonds with the skin to continue killing germs even after washing. Please DO NOT use if you have an allergy to CHG or antibacterial soaps.  If your skin becomes reddened/irritated stop using the CHG and inform your nurse when you arrive at Short Stay. Do not shave (including legs and underarms) for at least 48 hours prior to the first CHG shower.  You may shave your face/neck. Please follow these  instructions carefully:  1.  Shower with CHG Soap the night before surgery and the  morning of Surgery.  2.  If you choose to wash your hair, wash your hair first as usual with your  normal  shampoo.  3.  After you shampoo, rinse your hair and body thoroughly to remove the  shampoo.                           4.  Use CHG as you would any other liquid soap.  You can apply chg directly  to the skin and wash                       Gently with a scrungie or clean washcloth.  5.  Apply the CHG Soap to your body  ONLY FROM THE NECK DOWN.   Do not use on face/ open                           Wound or open sores. Avoid contact with eyes, ears mouth and genitals (private parts).                       Wash face,  Genitals (private parts) with your normal soap.             6.  Wash thoroughly, paying special attention to the area where your surgery  will be performed.  7.  Thoroughly rinse your body with warm water from the neck down.  8.  DO NOT shower/wash with your normal soap after using and rinsing off  the CHG Soap.                9.  Pat yourself dry with a clean towel.            10.  Wear clean pajamas.            11.  Place clean sheets on your bed the night of your first shower and do not  sleep with pets. Day of Surgery : Do not apply any lotions/deodorants the morning of surgery.  Please wear clean clothes to the hospital/surgery center.  FAILURE TO FOLLOW THESE INSTRUCTIONS MAY RESULT IN THE CANCELLATION OF YOUR SURGERY PATIENT SIGNATURE_________________________________  NURSE SIGNATURE__________________________________  ________________________________________________________________________  WHAT IS A BLOOD TRANSFUSION? Blood Transfusion Information  A transfusion is the replacement of blood or some of its parts. Blood is made up of multiple cells which provide different functions.  Red blood cells carry oxygen and are used for blood loss replacement.  White blood cells fight against  infection.  Platelets control bleeding.  Plasma helps clot blood.  Other blood products are available for specialized needs, such as hemophilia or other clotting disorders. BEFORE THE TRANSFUSION  Who gives blood for transfusions?   Healthy volunteers who are fully evaluated to make sure their blood is safe. This is blood bank blood. Transfusion therapy is the safest it has ever been in the practice of medicine. Before blood is taken from a donor, a complete history is taken to make sure that person has no history of diseases nor engages in risky social behavior (examples are intravenous drug use or sexual activity with multiple partners). The donor's travel history is screened to minimize risk of transmitting infections, such as malaria. The donated blood is tested for signs of infectious diseases, such as HIV and hepatitis. The blood is then tested to be sure it is compatible with you in order to minimize the chance of a transfusion reaction. If you or a relative donates blood, this is often done in anticipation of surgery and is not appropriate for emergency situations. It takes many days to process the donated blood. RISKS AND COMPLICATIONS Although transfusion therapy is very safe and saves many lives, the main dangers of transfusion include:   Getting an infectious disease.  Developing a transfusion reaction. This is an allergic reaction to something in the blood you were given. Every precaution is taken to prevent this. The decision to have a blood transfusion has been considered carefully by your caregiver before blood is given. Blood is not given unless the benefits outweigh the risks. AFTER THE TRANSFUSION  Right after receiving a blood transfusion, you will usually feel much  better and more energetic. This is especially true if your red blood cells have gotten low (anemic). The transfusion raises the level of the red blood cells which carry oxygen, and this usually causes an energy  increase.  The nurse administering the transfusion will monitor you carefully for complications. HOME CARE INSTRUCTIONS  No special instructions are needed after a transfusion. You may find your energy is better. Speak with your caregiver about any limitations on activity for underlying diseases you may have. SEEK MEDICAL CARE IF:   Your condition is not improving after your transfusion.  You develop redness or irritation at the intravenous (IV) site. SEEK IMMEDIATE MEDICAL CARE IF:  Any of the following symptoms occur over the next 12 hours:  Shaking chills.  You have a temperature by mouth above 102 F (38.9 C), not controlled by medicine.  Chest, back, or muscle pain.  People around you feel you are not acting correctly or are confused.  Shortness of breath or difficulty breathing.  Dizziness and fainting.  You get a rash or develop hives.  You have a decrease in urine output.  Your urine turns a dark color or changes to pink, red, or brown. Any of the following symptoms occur over the next 10 days:  You have a temperature by mouth above 102 F (38.9 C), not controlled by medicine.  Shortness of breath.  Weakness after normal activity.  The white part of the eye turns yellow (jaundice).  You have a decrease in the amount of urine or are urinating less often.  Your urine turns a dark color or changes to pink, red, or brown. Document Released: 06/24/2000 Document Revised: 09/19/2011 Document Reviewed: 02/11/2008 ExitCare Patient Information 2014 [place].  _______________________________________________________________________  Incentive Spirometer  An incentive spirometer is a tool that can help keep your lungs clear and active. This tool measures how well you are filling your lungs with each breath. Taking long deep breaths may help reverse or decrease the chance of developing breathing (pulmonary) problems (especially infection) following:  A long  period of time when you are unable to move or be active. BEFORE THE PROCEDURE   If the spirometer includes an indicator to show your best effort, your nurse or respiratory therapist will set it to a desired goal.  If possible, sit up straight or lean slightly forward. Try not to slouch.  Hold the incentive spirometer in an upright position. INSTRUCTIONS FOR USE  1. Sit on the edge of your bed if possible, or sit up as far as you can in bed or on a chair. 2. Hold the incentive spirometer in an upright position. 3. Breathe out normally. 4. Place the mouthpiece in your mouth and seal your lips tightly around it. 5. Breathe in slowly and as deeply as possible, raising the piston or the ball toward the top of the column. 6. Hold your breath for 3-5 seconds or for as long as possible. Allow the piston or ball to fall to the bottom of the column. 7. Remove the mouthpiece from your mouth and breathe out normally. 8. Rest for a few seconds and repeat Steps 1 through 7 at least 10 times every 1-2 hours when you are awake. Take your time and take a few normal breaths between deep breaths. 9. The spirometer may include an indicator to show your best effort. Use the indicator as a goal to work toward during each repetition. 10. After each set of 10 deep breaths, practice coughing to be sure  your lungs are clear. If you have an incision (the cut made at the time of surgery), support your incision when coughing by placing a pillow or rolled up towels firmly against it. Once you are able to get out of bed, walk around indoors and cough well. You may stop using the incentive spirometer when instructed by your caregiver.  RISKS AND COMPLICATIONS  Take your time so you do not get dizzy or light-headed.  If you are in pain, you may need to take or ask for pain medication before doing incentive spirometry. It is harder to take a deep breath if you are having pain. AFTER USE  Rest and breathe slowly and  easily.  It can be helpful to keep track of a log of your progress. Your caregiver can provide you with a simple table to help with this. If you are using the spirometer at home, follow these instructions: SEEK MEDICAL CARE IF:   You are having difficultly using the spirometer.  You have trouble using the spirometer as often as instructed.  Your pain medication is not giving enough relief while using the spirometer.  You develop fever of 100.5 F (38.1 C) or higher. SEEK IMMEDIATE MEDICAL CARE IF:   You cough up bloody sputum that had not been present before.  You develop fever of 102 F (38.9 C) or greater.  You develop worsening pain at or near the incision site. MAKE SURE YOU:   Understand these instructions.  Will watch your condition.  Will get help right away if you are not doing well or get worse. Document Released: 11/07/2006 Document Revised: 09/19/2011 Document Reviewed: 01/08/2007 ExitCare Patient Information 2014 ExitCare, [place].   ________________________________________________________________________ How to Manage Your Diabetes Before and After Surgery  Why is it important to control my blood sugar before and after surgery? . Improving blood sugar levels before and after surgery helps healing and can limit problems. . A way of improving blood sugar control is eating a healthy diet by: o  Eating less sugar and carbohydrates o  Increasing activity/exercise o  Talking with your doctor about reaching your blood sugar goals . High blood sugars (greater than 180 mg/dL) can raise your risk of infections and slow your recovery, so you will need to focus on controlling your diabetes during the weeks before surgery. . Make sure that the doctor who takes care of your diabetes knows about your planned surgery including the date and location.  How do I manage my blood sugar before surgery? . Check your blood sugar at least 4 times a day, starting 2 days before  surgery, to make sure that the level is not too high or low. o Check your blood sugar the morning of your surgery when you wake up and every 2 hours until you get to the Short Stay unit. . If your blood sugar is less than 70 mg/dL, you will need to treat for low blood sugar: o Do not take insulin. o Treat a low blood sugar (less than 70 mg/dL) with  cup of clear juice (cranberry or apple), 4 glucose tablets, OR glucose gel. o Recheck blood sugar in 15 minutes after treatment (to make sure it is greater than 70 mg/dL). If your blood sugar is not greater than 70 mg/dL on recheck, call [phone] for further instructions. . Report your blood sugar to the short stay nurse when you get to Short Stay.  . If you are admitted to the hospital after surgery: o Your blood  sugar will be checked by the staff and you will probably be given insulin after surgery (instead of oral diabetes medicines) to make sure you have good blood sugar levels. o The goal for blood sugar control after surgery is 80-180 mg/dL.   WHAT DO I DO ABOUT MY DIABETES MEDICATION?  Marland Kitchen Do not take oral diabetes medicines (pills) the morning of surgery.  . THE NIGHT BEFORE SURGERY, take     units of       insulin.       . THE MORNING OF SURGERY, take   units of         insulin.  . The day of surgery, do not take other diabetes injectables, including Byetta (exenatide), Bydureon (exenatide ER), Victoza (liraglutide), or Trulicity (dulaglutide).  . If your CBG is greater than 220 mg/dL, you may take  of your sliding scale  . (correction) dose of insulin.    For patients with insulin pumps: Contact your diabetes doctor for specific instructions before surgery. Decrease basal rates by 20% at midnight the night before your surgery. Note that if your surgery is planned to be longer than 2 hours, your insulin pump will be removed and intravenous (IV) insulin will be started and managed by the nurses and the anesthesiologist. You will  be able to restart your insulin pump once you are awake and able to manage it.  Make sure to bring insulin pump supplies to the hospital with you in case the  site needs to be changed.  Patient Signature:  Date:   Nurse Signature:  Date:   Reviewed and Endorsed by [facility] Patient Education Committee, August 2015

## 2016-11-04 DIAGNOSIS — I10 Essential (primary) hypertension: Secondary | ICD-10-CM | POA: Diagnosis not present

## 2016-11-04 DIAGNOSIS — I119 Hypertensive heart disease without heart failure: Secondary | ICD-10-CM | POA: Diagnosis not present

## 2016-11-04 DIAGNOSIS — N390 Urinary tract infection, site not specified: Secondary | ICD-10-CM | POA: Diagnosis not present

## 2016-11-04 DIAGNOSIS — J4530 Mild persistent asthma, uncomplicated: Secondary | ICD-10-CM | POA: Diagnosis not present

## 2016-11-04 DIAGNOSIS — Z87891 Personal history of nicotine dependence: Secondary | ICD-10-CM | POA: Diagnosis not present

## 2016-11-04 DIAGNOSIS — E1165 Type 2 diabetes mellitus with hyperglycemia: Secondary | ICD-10-CM | POA: Diagnosis not present

## 2016-11-04 DIAGNOSIS — Z7689 Persons encountering health services in other specified circumstances: Secondary | ICD-10-CM | POA: Diagnosis not present

## 2016-11-07 ENCOUNTER — Encounter (HOSPITAL_COMMUNITY)
Admission: RE | Admit: 2016-11-07 | Discharge: 2016-11-07 | Disposition: A | Discharge status: Home / Self Care | Payer: Medicare HMO | Source: Ambulatory Visit | Attending: Orthopedic Surgery | Admitting: Orthopedic Surgery

## 2016-11-07 ENCOUNTER — Other Ambulatory Visit: Payer: Self-pay | Admitting: Orthopedic Surgery

## 2016-11-07 DIAGNOSIS — B961 Klebsiella pneumoniae [K. pneumoniae] as the cause of diseases classified elsewhere: Secondary | ICD-10-CM | POA: Diagnosis not present

## 2016-11-07 DIAGNOSIS — N390 Urinary tract infection, site not specified: Secondary | ICD-10-CM | POA: Diagnosis not present

## 2016-11-14 ENCOUNTER — Encounter (HOSPITAL_COMMUNITY): Admission: RE | Payer: Self-pay | Source: Ambulatory Visit

## 2016-11-14 ENCOUNTER — Inpatient Hospital Stay (HOSPITAL_COMMUNITY): Admission: RE | Admit: 2016-11-14 | Payer: Medicare HMO | Source: Ambulatory Visit | Admitting: Orthopedic Surgery

## 2016-11-14 SURGERY — ARTHROPLASTY, KNEE, TOTAL
Anesthesia: Spinal | Site: Knee | Laterality: Right

## 2016-11-20 DIAGNOSIS — R69 Illness, unspecified: Secondary | ICD-10-CM | POA: Diagnosis not present

## 2016-11-28 NOTE — Progress Notes (Signed)
Please place orders in EPIC as patient is being scheduled for a pre-op appointment! Thank you! 

## 2016-12-02 ENCOUNTER — Encounter (HOSPITAL_COMMUNITY): Payer: Self-pay

## 2016-12-07 NOTE — Progress Notes (Addendum)
07-26-16 EKG (NS[facility]) on chart  11-04-16 Labs on chart -HGA1C 9.1, CBC w/ Diff, BMP, CMP,      Lipid,  UA/UC, Beta strep, Methylamonic      Acid, TSH, Vit D 25, Vit B12, Foliate,      Hep Panel, and PSA   11-04-16 Office note on chart

## 2016-12-07 NOTE — Patient Instructions (Addendum)
[name] LoudWilliam J Kief  12/07/2016   Your procedure is scheduled on: 12-13-16  [facility] Report to Admitting at 10:25 AM   Call this number if you have problems the morning of surgery [phone]   Remember: ONLY 1 PERSON MAY GO WITH YOU TO SHORT STAY TO GET  READY MORNING OF YOUR SURGERY.  Do not eat food or drink liquids :After Midnight.     Take these medicines the morning of surgery with A SIP OF WATER: None. You may bring and use any inhaler that you might need   DO NOT TAKE ANY DIABETIC MEDICATIONS DAY OF YOUR SURGERY                               You may not have any metal on your body including hair pins and              piercings  Do not wear jewelry, make-up, lotions, powders or perfumes, deodorant             Men may shave face and neck.   Do not bring valuables to the hospital. [facility] IS NOT             RESPONSIBLE   FOR VALUABLES.  Contacts, dentures or bridgework may not be worn into surgery.  Leave suitcase in the car. After surgery it may be brought to your room.                 Please read over the following fact sheets you were given: _____________________________________________________________________  How to Manage Your Diabetes Before and After Surgery  Why is it important to control my blood sugar before and after surgery? . Improving blood sugar levels before and after surgery helps healing and can limit problems. . A way of improving blood sugar control is eating a healthy diet by: o  Eating less sugar and carbohydrates o  Increasing activity/exercise o  Talking with your doctor about reaching your blood sugar goals . High blood sugars (greater than 180 mg/dL) can raise your risk of infections and slow your recovery, so you will need to focus on controlling your diabetes during the weeks before surgery. . Make sure that the doctor who takes care of your diabetes knows about your planned surgery including  the date and location.  How do I manage my blood sugar before surgery? . Check your blood sugar at least 4 times a day, starting 2 days before surgery, to make sure that the level is not too high or low. o Check your blood sugar the morning of your surgery when you wake up and every 2 hours until you get to the Short Stay unit. . If your blood sugar is less than 70 mg/dL, you will need to treat for low blood sugar: o Do not take insulin. o Treat a low blood sugar (less than 70 mg/dL) with  cup of clear juice (cranberry or apple), 4 glucose tablets, OR glucose gel. o Recheck blood sugar in 15 minutes after treatment (to make sure it is greater than 70 mg/dL). If your blood sugar is not greater than 70 mg/dL on recheck, call [phone] for further instructions. . Report your blood sugar to the short stay nurse when you get to Short Stay.  . If you are admitted to the  hospital after surgery: o Your blood sugar will be checked by the staff and you will probably be given insulin after surgery (instead of oral diabetes medicines) to make sure you have good blood sugar levels. o The goal for blood sugar control after surgery is 80-180 mg/dL.   WHAT DO I DO ABOUT MY DIABETES MEDICATION?  Marland Kitchen Do not take oral diabetes medicines (pills) the morning of surgery.  . THE DAY BEFORE SURGERY, take your usual dose of Metformin.          Patient Signature:  Date:   Nurse Signature:  Date:   Reviewed and Endorsed by [facility] Patient Education Committee, August 2015           [facility] - Preparing for Surgery Before surgery, you can play an important role.  Because skin is not sterile, your skin needs to be as free of germs as possible.  You can reduce the number of germs on your skin by washing with CHG (chlorahexidine gluconate) soap before surgery.  CHG is an antiseptic cleaner which kills germs and bonds with the skin to continue killing germs even after washing. Please DO NOT use if you have  an allergy to CHG or antibacterial soaps.  If your skin becomes reddened/irritated stop using the CHG and inform your nurse when you arrive at Short Stay. Do not shave (including legs and underarms) for at least 48 hours prior to the first CHG shower.  You may shave your face/neck. Please follow these instructions carefully:  1.  Shower with CHG Soap the night before surgery and the  morning of Surgery.  2.  If you choose to wash your hair, wash your hair first as usual with your  normal  shampoo.  3.  After you shampoo, rinse your hair and body thoroughly to remove the  shampoo.                           4.  Use CHG as you would any other liquid soap.  You can apply chg directly  to the skin and wash                       Gently with a scrungie or clean washcloth.  5.  Apply the CHG Soap to your body ONLY FROM THE NECK DOWN.   Do not use on face/ open                           Wound or open sores. Avoid contact with eyes, ears mouth and genitals (private parts).                       Wash face,  Genitals (private parts) with your normal soap.             6.  Wash thoroughly, paying special attention to the area where your surgery  will be performed.  7.  Thoroughly rinse your body with warm water from the neck down.  8.  DO NOT shower/wash with your normal soap after using and rinsing off  the CHG Soap.                9.  Pat yourself dry with a clean towel.            10.  Wear clean pajamas.  11.  Place clean sheets on your bed the night of your first shower and do not  sleep with pets. Day of Surgery : Do not apply any lotions/deodorants the morning of surgery.  Please wear clean clothes to the hospital/surgery center.  FAILURE TO FOLLOW THESE INSTRUCTIONS MAY RESULT IN THE CANCELLATION OF YOUR SURGERY PATIENT SIGNATURE_________________________________  NURSE  SIGNATURE__________________________________  ________________________________________________________________________   [name]  An incentive spirometer is a tool that can help keep your lungs clear and active. This tool measures how well you are filling your lungs with each breath. Taking long deep breaths may help reverse or decrease the chance of developing breathing (pulmonary) problems (especially infection) following:  A long period of time when you are unable to move or be active. BEFORE THE PROCEDURE   If the spirometer includes an indicator to show your [place] effort, your nurse or respiratory therapist will set it to a desired goal.  If possible, sit up straight or lean slightly forward. Try not to slouch.  Hold the incentive spirometer in an upright position. INSTRUCTIONS FOR USE  1. Sit on the edge of your bed if possible, or sit up as far as you can in bed or on a chair. 2. Hold the incentive spirometer in an upright position. 3. Breathe out normally. 4. Place the mouthpiece in your mouth and seal your lips tightly around it. 5. Breathe in slowly and as deeply as possible, raising the piston or the ball toward the top of the column. 6. Hold your breath for 3-5 seconds or for as long as possible. Allow the piston or ball to fall to the bottom of the column. 7. Remove the mouthpiece from your mouth and breathe out normally. 8. Rest for a few seconds and repeat Steps 1 through 7 at least 10 times every 1-2 hours when you are awake. Take your time and take a few normal breaths between deep breaths. 9. The spirometer may include an indicator to show your [place] effort. Use the indicator as a goal to work toward during each repetition. 10. After each set of 10 deep breaths, practice coughing to be sure your lungs are clear. If you have an incision (the cut made at the time of surgery), support your incision when coughing by placing a pillow or rolled up towels firmly  against it. Once you are able to get out of bed, walk around indoors and cough well. You may stop using the incentive spirometer when instructed by your caregiver.  RISKS AND COMPLICATIONS  Take your time so you do not get dizzy or light-headed.  If you are in pain, you may need to take or ask for pain medication before doing incentive spirometry. It is harder to take a deep breath if you are having pain. AFTER USE  Rest and breathe slowly and easily.  It can be helpful to keep track of a log of your progress. Your caregiver can provide you with a simple table to help with this. If you are using the spirometer at home, follow these instructions: [place] IF:   You are having difficultly using the spirometer.  You have trouble using the spirometer as often as instructed.  Your pain medication is not giving enough relief while using the spirometer.  You develop fever of 100.5 F (38.1 C) or higher. SEEK IMMEDIATE MEDICAL CARE IF:   You cough up bloody sputum that had not been present before.  You develop fever of 102 F (38.9 C)  or greater.  You develop worsening pain at or near the incision site. MAKE SURE YOU:   Understand these instructions.  Will watch your condition.  Will get help right away if you are not doing well or get worse. Document Released: 11/07/2006 Document Revised: 09/19/2011 Document Reviewed: 01/08/2007 Exit[employer] Patient Information 2014 Exit[employer], [place].   ________________________________________________________________________  WHAT IS A BLOOD TRANSFUSION? Blood Transfusion Information  A transfusion is the replacement of blood or some of its parts. Blood is made up of multiple cells which provide different functions.  Red blood cells carry oxygen and are used for blood loss replacement.  White blood cells fight against infection.  Platelets control bleeding.  Plasma helps clot blood.  Other blood products are available for  specialized needs, such as hemophilia or other clotting disorders. BEFORE THE TRANSFUSION  Who gives blood for transfusions?   Healthy volunteers who are fully evaluated to make sure their blood is safe. This is blood bank blood. Transfusion therapy is the safest it has ever been in the practice of medicine. Before blood is taken from a donor, a complete history is taken to make sure that person has no history of diseases nor engages in risky social behavior (examples are intravenous drug use or sexual activity with multiple partners). The donor's travel history is screened to minimize risk of transmitting infections, such as malaria. The donated blood is tested for signs of infectious diseases, such as HIV and hepatitis. The blood is then tested to be sure it is compatible with you in order to minimize the chance of a transfusion reaction. If you or a relative donates blood, this is often done in anticipation of surgery and is not appropriate for emergency situations. It takes many days to process the donated blood. RISKS AND COMPLICATIONS Although transfusion therapy is very safe and saves many lives, the main dangers of transfusion include:   Getting an infectious disease.  Developing a transfusion reaction. This is an allergic reaction to something in the blood you were given. Every precaution is taken to prevent this. The decision to have a blood transfusion has been considered carefully by your caregiver before blood is given. Blood is not given unless the benefits outweigh the risks. AFTER THE TRANSFUSION  Right after receiving a blood transfusion, you will usually feel much better and more energetic. This is especially true if your red blood cells have gotten low (anemic). The transfusion raises the level of the red blood cells which carry oxygen, and this usually causes an energy increase.  The nurse administering the transfusion will monitor you carefully for complications. HOME CARE  INSTRUCTIONS  No special instructions are needed after a transfusion. You may find your energy is better. Speak with your caregiver about any limitations on activity for underlying diseases you may have. SEEK MEDICAL CARE IF:   Your condition is not improving after your transfusion.  You develop redness or irritation at the intravenous (IV) site. SEEK IMMEDIATE MEDICAL CARE IF:  Any of the following symptoms occur over the next 12 hours:  Shaking chills.  You have a temperature by mouth above 102 F (38.9 C), not controlled by medicine.  Chest, back, or muscle pain.  People around you feel you are not acting correctly or are confused.  Shortness of breath or difficulty breathing.  Dizziness and fainting.  You get a rash or develop hives.  You have a decrease in urine output.  Your urine turns a dark color or changes to pink, red,  or brown. Any of the following symptoms occur over the next 10 days:  You have a temperature by mouth above 102 F (38.9 C), not controlled by medicine.  Shortness of breath.  Weakness after normal activity.  The white part of the eye turns yellow (jaundice).  You have a decrease in the amount of urine or are urinating less often.  Your urine turns a dark color or changes to pink, red, or brown. Document Released: 06/24/2000 Document Revised: 09/19/2011 Document Reviewed: 02/11/2008 [facility] Patient Information 2014 [place], [place].  _______________________________________________________________________

## 2016-12-08 ENCOUNTER — Encounter (INDEPENDENT_AMBULATORY_CARE_PROVIDER_SITE_OTHER): Payer: Self-pay

## 2016-12-08 ENCOUNTER — Encounter (HOSPITAL_COMMUNITY): Payer: Self-pay

## 2016-12-08 ENCOUNTER — Encounter (HOSPITAL_COMMUNITY)
Admission: RE | Admit: 2016-12-08 | Discharge: 2016-12-08 | Disposition: A | Discharge status: Home / Self Care | Payer: Medicare HMO | Source: Ambulatory Visit | Attending: Orthopedic Surgery | Admitting: Orthopedic Surgery

## 2016-12-08 DIAGNOSIS — Z0183 Encounter for blood typing: Secondary | ICD-10-CM | POA: Insufficient documentation

## 2016-12-08 DIAGNOSIS — B961 Klebsiella pneumoniae [K. pneumoniae] as the cause of diseases classified elsewhere: Secondary | ICD-10-CM | POA: Diagnosis not present

## 2016-12-08 DIAGNOSIS — Z87891 Personal history of nicotine dependence: Secondary | ICD-10-CM | POA: Diagnosis not present

## 2016-12-08 DIAGNOSIS — I10 Essential (primary) hypertension: Secondary | ICD-10-CM | POA: Diagnosis not present

## 2016-12-08 DIAGNOSIS — I119 Hypertensive heart disease without heart failure: Secondary | ICD-10-CM | POA: Diagnosis not present

## 2016-12-08 DIAGNOSIS — J4530 Mild persistent asthma, uncomplicated: Secondary | ICD-10-CM | POA: Diagnosis not present

## 2016-12-08 DIAGNOSIS — M1711 Unilateral primary osteoarthritis, right knee: Secondary | ICD-10-CM | POA: Insufficient documentation

## 2016-12-08 DIAGNOSIS — Z01812 Encounter for preprocedural laboratory examination: Secondary | ICD-10-CM | POA: Insufficient documentation

## 2016-12-08 DIAGNOSIS — Z7689 Persons encountering health services in other specified circumstances: Secondary | ICD-10-CM | POA: Diagnosis not present

## 2016-12-08 DIAGNOSIS — E1165 Type 2 diabetes mellitus with hyperglycemia: Secondary | ICD-10-CM | POA: Diagnosis not present

## 2016-12-08 HISTORY — DX: Headache: R51

## 2016-12-08 HISTORY — DX: Headache, unspecified: R51.9

## 2016-12-08 HISTORY — DX: Adverse effect of unspecified anesthetic, initial encounter: T41.45XA

## 2016-12-08 HISTORY — DX: Other complications of anesthesia, initial encounter: T88.59XA

## 2016-12-08 LAB — BASIC METABOLIC PANEL
ANION GAP: 8 (ref 5–15)
BUN: 19 mg/dL (ref 6–20)
CALCIUM: 10.2 mg/dL (ref 8.9–10.3)
CHLORIDE: 109 mmol/L (ref 101–111)
CO2: 32 mmol/L (ref 22–32)
Creatinine, Ser: 1.16 mg/dL (ref 0.61–1.24)
GFR calc Af Amer: >60 mL/min (ref >60)
GFR calc non Af Amer: >60 mL/min (ref >60)
Glucose, Bld: 77 mg/dL (ref 65–99)
Potassium: 5.5 mmol/L — ABNORMAL HIGH (ref 3.5–5.1)
Sodium: 149 mmol/L — ABNORMAL HIGH (ref 135–145)

## 2016-12-08 LAB — CBC
HCT: 44.1 % (ref 39.0–52.0)
HEMOGLOBIN: 14.6 g/dL (ref 13.0–17.0)
MCH: 29.3 pg (ref 26.0–34.0)
MCHC: 33.1 g/dL (ref 30.0–36.0)
MCV: 88.4 fL (ref 78.0–100.0)
Platelets: 133 10*3/uL — ABNORMAL LOW (ref 150–400)
RBC: 4.99 MIL/uL (ref 4.22–5.81)
RDW: 14.1 % (ref 11.5–15.5)
WBC: 5.3 10*3/uL (ref 4.0–10.5)

## 2016-12-08 LAB — SURGICAL PCR SCREEN
MRSA, PCR: NEGATIVE
Staphylococcus aureus: POSITIVE — AB

## 2016-12-08 LAB — GLUCOSE, CAPILLARY: Glucose-Capillary: 123 mg/dL — ABNORMAL HIGH (ref 65–99)

## 2016-12-09 ENCOUNTER — Encounter (HOSPITAL_COMMUNITY): Payer: Self-pay

## 2016-12-09 LAB — HEMOGLOBIN A1C
Hgb A1c MFr Bld: 5.7 % — ABNORMAL HIGH (ref 4.8–5.6)
Mean Plasma Glucose: 117 mg/dL

## 2016-12-09 NOTE — Progress Notes (Signed)
   12/08/16 0949  OBSTRUCTIVE SLEEP APNEA  Have you ever been diagnosed with sleep apnea through a sleep study? No  Do you snore loudly (loud enough to be heard through closed doors)?  1  Do you often feel tired, fatigued, or sleepy during the daytime (such as falling asleep during driving or talking to someone)? 0  Has anyone observed you stop breathing during your sleep? 1  Do you have, or are you being treated for high blood pressure? 1  BMI more than 35 kg/m2? 0  Age > 50 (1-yes) 1  Neck circumference greater than:Male 16 inches or larger, Male 17inches or larger? 0  Male Gender (Yes=1) 1  Obstructive Sleep Apnea Score 5

## 2016-12-09 NOTE — Progress Notes (Signed)
12-09-16 PCR results routed to Dr. Charlann Boxerlin for review. Pt also aware that they will prep with Betadine on day of procedure.

## 2016-12-09 NOTE — H&P (Signed)
TOTAL KNEE ADMISSION H&P  Patient is being admitted for right total knee arthroplasty.  Subjective:  Chief Complaint:     Right knee primary OA / pain  HPI: [name] [name], 71 y.o. male, has a history of pain and functional disability in the right knee due to arthritis and has failed non-surgical conservative treatments for greater than 12 weeks to include NSAID's and/or analgesics and activity modification.  Onset of symptoms was gradual, starting 20+ years ago with gradually worsening course since that time. The patient noted prior procedures on the knee to include  arthroscopy and menisectomy on the right knee(s).  Patient currently rates pain in the right knee(s) at 8 out of 10 with activity. Patient has worsening of pain with activity and weight bearing, pain with passive range of motion, crepitus and joint swelling.  Patient has evidence of periarticular osteophytes and joint space narrowing by imaging studies. There is no active infection.   Risks, benefits and expectations were discussed with the patient.  Risks including but not limited to the risk of anesthesia, blood clots, nerve damage, blood vessel damage, failure of the prosthesis, infection and up to and including death.  Patient understand the risks, benefits and expectations and wishes to proceed with surgery.   PCP: [name] Plum, MD  D/C Plans:       Home   Post-op Meds:       No Rx given  Tranexamic Acid:      To be given - IV   Decadron:      Is to be given  FYI:     ASA  APAP (Tylox has worked previously)  DME:   Pt already has equipment  PT:   OPPT   Patient Active Problem List   Diagnosis Date Noted  . Bright red rectal bleeding   . Diverticulosis of colon with hemorrhage 02/15/2015  . GI bleed due to NSAIDs 02/15/2015  . DIABETES, TYPE 2 09/22/2010  . ALLERGIC RHINITIS 04/18/2007  . ASTHMA 04/18/2007   Past Medical History:  Diagnosis Date  . Allergic rhinitis   . Asthma   . Chronic kidney  disease    kidney stones  . Complication of anesthesia    Difficult arousing  . Diabetes mellitus type II   . Diverticulosis of colon with hemorrhage 02/15/2015  . Dysrhythmia    tachycardia  . Headache   . Hypertension     Past Surgical History:  Procedure Laterality Date  . CERVICAL FUSION  10/13/10   Dr. Lovell Sheehan  . COLONOSCOPY WITH PROPOFOL N/A 02/16/2015   Procedure: COLONOSCOPY WITH PROPOFOL;  Surgeon: Iva Boop, MD;  Location: [facility] END[facility]COPY;  Service: Endoscopy;  Laterality: N/A;  . CYSTOSCOPY W/ URETERAL STENT REMOVAL  2010  . CYSTOSCOPY WITH RETROGRADE PYELOGRAM, URETEROSCOPY AND STENT PLACEMENT  2010  . EYE SURGERY  2008   Bilateral catract extraction  . KNEE ARTHROSCOPY  80's and 90's    No prescriptions prior to admission.   Allergies  Allergen Reactions  . Dilaudid [Hydromorphone Hcl] Shortness Of Breath  . Hydrocodone Shortness Of Breath  . Percocet [Oxycodone-Acetaminophen] Shortness Of Breath  . Tramadol Shortness Of Breath  . Penicillins Other (See Comments)    Passes out Has patient had a PCN reaction causing immediate rash, facial/tongue/throat swelling, SOB or lightheadedness with hypotension: unknown Has patient had a PCN reaction causing severe rash involving mucus membranes or skin necrosis: unknown Has patient had a PCN reaction that required hospitalization no Has patient had a  PCN reaction occurring within the last 10 years: no If all of the above answers are "NO", then may proceed with Cephalosporin use.    Social History  Substance Use Topics  . Smoking status: Former Smoker    Packs/day: 1.00    Years: 15.00    Quit date: 07/11/1978  . Smokeless tobacco: Never Used  . Alcohol use No    Family History  Problem Relation Age of Onset  . Heart attack Unknown   . Diabetes Unknown      Review of Systems  Constitutional: Negative.   HENT: Negative.   Eyes: Negative.   Respiratory: Negative.   Cardiovascular: Negative.   Gastrointestinal:  Negative.   Genitourinary: Negative.   Musculoskeletal: Positive for joint pain.  Skin: Negative.   Neurological: Negative.   Endo/Heme/Allergies: Positive for environmental allergies.  Psychiatric/Behavioral: Negative.     Objective:  Physical Exam  Constitutional: He is oriented to person, place, and time. He appears well-developed.  HENT:  Head: Normocephalic.  Eyes: Pupils are equal, round, and reactive to light.  Neck: Neck supple. No JVD present. No tracheal deviation present. No thyromegaly present.  Cardiovascular: Normal rate, regular rhythm and intact distal pulses.   Respiratory: Effort normal and breath sounds normal. No respiratory distress. He has no wheezes.  GI: Soft. There is no tenderness. There is no guarding.  Musculoskeletal:       Right knee: He exhibits decreased range of motion, swelling and bony tenderness. He exhibits no ecchymosis, no deformity, no laceration and no erythema. Tenderness found.  Lymphadenopathy:    He has no cervical adenopathy.  Neurological: He is alert and oriented to person, place, and time.  Skin: Skin is warm and dry.  Psychiatric: He has a normal mood and affect.      Labs:  Estimated body mass index is 22.4 kg/m as calculated from the following:   Height as of 12/08/16: 5\' 7"  (1.702 m).   Weight as of 12/08/16: 64.9 kg (143 lb).   Imaging Review Plain radiographs demonstrate severe degenerative joint disease of the right knee(s).  The bone quality appears to be good for age and reported activity level.  Assessment/Plan:  End stage arthritis, right knee   The patient history, physical examination, clinical judgment of the provider and imaging studies are consistent with end stage degenerative joint disease of the right knee(s) and total knee arthroplasty is deemed medically necessary. The treatment options including medical management, injection therapy arthroscopy and arthroplasty were discussed at length. The risks and  benefits of total knee arthroplasty were presented and reviewed. The risks due to aseptic loosening, infection, stiffness, patella tracking problems, thromboembolic complications and other imponderables were discussed. The patient acknowledged the explanation, agreed to proceed with the plan and consent was signed. Patient is being admitted for inpatient treatment for surgery, pain control, PT, OT, prophylactic antibiotics, VTE prophylaxis, progressive ambulation and ADL's and discharge planning. The patient is planning to be discharged home.      [name] AuerbachMatthew S. Deriona Altemose   PA-C  12/09/2016, 1:52 PM

## 2016-12-13 ENCOUNTER — Encounter (HOSPITAL_COMMUNITY): Admission: RE | Payer: Self-pay | Source: Ambulatory Visit

## 2016-12-13 ENCOUNTER — Inpatient Hospital Stay (HOSPITAL_COMMUNITY): Admission: RE | Admit: 2016-12-13 | Payer: Medicare HMO | Source: Ambulatory Visit | Admitting: Orthopedic Surgery

## 2016-12-13 LAB — TYPE AND SCREEN
ABO/RH(D): A POS
Antibody Screen: NEGATIVE

## 2016-12-13 SURGERY — ARTHROPLASTY, KNEE, TOTAL
Anesthesia: Spinal | Site: Knee | Laterality: Right

## 2016-12-23 DIAGNOSIS — Z87891 Personal history of nicotine dependence: Secondary | ICD-10-CM | POA: Diagnosis not present

## 2016-12-23 DIAGNOSIS — E1165 Type 2 diabetes mellitus with hyperglycemia: Secondary | ICD-10-CM | POA: Diagnosis not present

## 2016-12-23 DIAGNOSIS — I119 Hypertensive heart disease without heart failure: Secondary | ICD-10-CM | POA: Diagnosis not present

## 2016-12-23 DIAGNOSIS — I10 Essential (primary) hypertension: Secondary | ICD-10-CM | POA: Diagnosis not present

## 2016-12-23 DIAGNOSIS — B961 Klebsiella pneumoniae [K. pneumoniae] as the cause of diseases classified elsewhere: Secondary | ICD-10-CM | POA: Diagnosis not present

## 2016-12-23 DIAGNOSIS — Z7689 Persons encountering health services in other specified circumstances: Secondary | ICD-10-CM | POA: Diagnosis not present

## 2016-12-23 DIAGNOSIS — J4530 Mild persistent asthma, uncomplicated: Secondary | ICD-10-CM | POA: Diagnosis not present

## 2016-12-30 ENCOUNTER — Encounter (HOSPITAL_COMMUNITY): Payer: Self-pay | Admitting: *Deleted

## 2017-01-12 DIAGNOSIS — J449 Chronic obstructive pulmonary disease, unspecified: Secondary | ICD-10-CM | POA: Diagnosis not present

## 2017-01-12 DIAGNOSIS — Z0000 Encounter for general adult medical examination without abnormal findings: Secondary | ICD-10-CM | POA: Diagnosis not present

## 2017-01-12 DIAGNOSIS — I10 Essential (primary) hypertension: Secondary | ICD-10-CM | POA: Diagnosis not present

## 2017-01-12 DIAGNOSIS — Z7952 Long term (current) use of systemic steroids: Secondary | ICD-10-CM | POA: Diagnosis not present

## 2017-01-12 DIAGNOSIS — M13861 Other specified arthritis, right knee: Secondary | ICD-10-CM | POA: Diagnosis not present

## 2017-01-12 DIAGNOSIS — Z6822 Body mass index (BMI) 22.0-22.9, adult: Secondary | ICD-10-CM | POA: Diagnosis not present

## 2017-01-12 DIAGNOSIS — E119 Type 2 diabetes mellitus without complications: Secondary | ICD-10-CM | POA: Diagnosis not present

## 2017-01-12 DIAGNOSIS — R000 Tachycardia, unspecified: Secondary | ICD-10-CM | POA: Diagnosis not present

## 2017-01-12 DIAGNOSIS — Z7984 Long term (current) use of oral hypoglycemic drugs: Secondary | ICD-10-CM | POA: Diagnosis not present

## 2017-01-12 DIAGNOSIS — Z79899 Other long term (current) drug therapy: Secondary | ICD-10-CM | POA: Diagnosis not present

## 2017-01-18 ENCOUNTER — Other Ambulatory Visit (HOSPITAL_COMMUNITY): Payer: Self-pay | Admitting: Emergency Medicine

## 2017-01-18 DIAGNOSIS — Z87891 Personal history of nicotine dependence: Secondary | ICD-10-CM | POA: Diagnosis not present

## 2017-01-18 DIAGNOSIS — I119 Hypertensive heart disease without heart failure: Secondary | ICD-10-CM | POA: Diagnosis not present

## 2017-01-18 DIAGNOSIS — J4530 Mild persistent asthma, uncomplicated: Secondary | ICD-10-CM | POA: Diagnosis not present

## 2017-01-18 DIAGNOSIS — E1165 Type 2 diabetes mellitus with hyperglycemia: Secondary | ICD-10-CM | POA: Diagnosis not present

## 2017-01-18 DIAGNOSIS — I10 Essential (primary) hypertension: Secondary | ICD-10-CM | POA: Diagnosis not present

## 2017-01-18 NOTE — Progress Notes (Signed)
LOV Bonsu MD 12-08-16 on chart   EKG 07-26-16 on chart   HgA1c 12-08-16 epic

## 2017-01-18 NOTE — Patient Instructions (Addendum)
[name] [name]  01/18/2017   Your procedure is scheduled on: 01-24-17  Report to [facility] Main  Entrance   Report to admitting at [facility]   Call this number if you have problems the morning of surgery [phone]   Remember: ONLY 1 PERSON MAY GO WITH YOU TO SHORT STAY TO GET  READY MORNING OF YOUR SURGERY.  Do not eat food or drink liquids :After Midnight.     Take these medicines the morning of surgery with A SIP OF WATER: tylenol as needed, nebulizer and inhaler as needed (may bring to the hospital with you)   DO NOT TAKE ANY DIABETIC MEDICATIONS DAY OF YOUR SURGERY                               You may not have any metal on your body including hair pins and              piercings  Do not wear jewelry, make-up, lotions, powders or perfumes, deodorant                        Men may shave face and neck.   Do not bring valuables to the hospital. [facility] IS NOT             RESPONSIBLE   FOR VALUABLES.  Contacts, dentures or bridgework may not be worn into surgery.  Leave suitcase in the car. After surgery it may be brought to your room.               Please read over the following fact sheets you were given: _____________________________________________________________________             How to Manage Your Diabetes Before and After Surgery  Why is it important to control my blood sugar before and after surgery? . Improving blood sugar levels before and after surgery helps healing and can limit problems. . A way of improving blood sugar control is eating a healthy diet by: o  Eating less sugar and carbohydrates o  Increasing activity/exercise o  Talking with your doctor about reaching your blood sugar goals . High blood sugars (greater than 180 mg/dL) can raise your risk of infections and slow your recovery, so you will need to focus on controlling your diabetes during the weeks before surgery. . Make sure that the doctor who takes care of your  diabetes knows about your planned surgery including the date and location.  How do I manage my blood sugar before surgery? . Check your blood sugar at least 4 times a day, starting 2 days before surgery, to make sure that the level is not too high or low. o Check your blood sugar the morning of your surgery when you wake up and every 2 hours until you get to the Short Stay unit. . If your blood sugar is less than 70 mg/dL, you will need to treat for low blood sugar: o Do not take insulin. o Treat a low blood sugar (less than 70 mg/dL) with  cup of clear juice (cranberry or apple), 4 glucose tablets, OR glucose gel. o Recheck blood sugar in 15 minutes after treatment (to make sure it is greater than 70 mg/dL). If your blood sugar is not greater than 70 mg/dL on recheck, call [phone] for further  instructions. . Report your blood sugar to the short stay nurse when you get to Short Stay.  . If you are admitted to the hospital after surgery: o Your blood sugar will be checked by the staff and you will probably be given insulin after surgery (instead of oral diabetes medicines) to make sure you have good blood sugar levels. o The goal for blood sugar control after surgery is 80-180 mg/dL.   WHAT DO I DO ABOUT MY DIABETES MEDICATION?   . THE DAY BEFORE SURGERY, take  METFORMIN as usual       . THE MORNING OF SURGERY, Do not take oral diabetes medicines (pills)    Patient Signature:  Date:   Nurse Signature:  Date:   Reviewed and Endorsed by [facility] Patient Education [facility], August 2015  [facility] Pre[facility] Before surgery, you can play an important role.  Because skin is not sterile, your skin needs to be as free of germs as possible.  You can reduce the number of germs on your skin by washing with CHG (chlorahexidine gluconate) soap before surgery.  CHG is an antiseptic cleaner which kills germs and bonds with the skin to continue killing germs even after  washing. Please DO NOT use if you have an allergy to CHG or antibacterial soaps.  If your skin becomes reddened/irritated stop using the CHG and inform your nurse when you arrive at Short Stay. Do not shave (including legs and underarms) for at least 48 hours prior to the first CHG shower.  You may shave your face/neck. Please follow these instructions carefully:  1.  Shower with CHG Soap the night before surgery and the  morning of Surgery.  2.  If you choose to wash your hair, wash your hair first as usual with your  normal  shampoo.  3.  After you shampoo, rinse your hair and body thoroughly to remove the  shampoo.                           4.  Use CHG as you would any other liquid soap.  You can apply chg directly  to the skin and wash                       Gently with a scrungie or clean washcloth.  5.  Apply the CHG Soap to your body ONLY FROM THE NECK DOWN.   Do not use on face/ open                           Wound or open sores. Avoid contact with eyes, ears mouth and genitals (private parts).                       Wash face,  Genitals (private parts) with your normal soap.             6.  Wash thoroughly, paying special attention to the area where your surgery  will be performed.  7.  Thoroughly rinse your body with warm water from the neck down.  8.  DO NOT shower/wash with your normal soap after using and rinsing off  the CHG Soap.                9.  Pat yourself dry with a clean towel.  10.  Wear clean pajamas.            11.  Place clean sheets on your bed the night of your first shower and do not  sleep with pets. Day of Surgery : Do not apply any lotions/deodorants the morning of surgery.  Please wear clean clothes to the hospital/surgery center.  FAILURE TO FOLLOW THESE INSTRUCTIONS MAY RESULT IN THE CANCELLATION OF YOUR SURGERY PATIENT SIGNATURE_________________________________  NURSE  SIGNATURE__________________________________  ________________________________________________________________________   [name] [name]  An incentive spirometer is a tool that can help keep your lungs clear and active. This tool measures how well you are filling your lungs with each breath. Taking long deep breaths may help reverse or decrease the chance of developing breathing (pulmonary) problems (especially infection) following:  A long period of time when you are unable to move or be active. BEFORE THE PROCEDURE   If the spirometer includes an indicator to show your best effort, your nurse or respiratory therapist will set it to a desired goal.  If possible, sit up straight or lean slightly forward. Try not to slouch.  Hold the incentive spirometer in an upright position. INSTRUCTIONS FOR USE  1. Sit on the edge of your bed if possible, or sit up as far as you can in bed or on a chair. 2. Hold the incentive spirometer in an upright position. 3. Breathe out normally. 4. Place the mouthpiece in your mouth and seal your lips tightly around it. 5. Breathe in slowly and as deeply as possible, raising the piston or the ball toward the top of the column. 6. Hold your breath for 3-5 seconds or for as long as possible. Allow the piston or ball to fall to the bottom of the column. 7. Remove the mouthpiece from your mouth and breathe out normally. 8. Rest for a few seconds and repeat Steps 1 through 7 at least 10 times every 1-2 hours when you are awake. Take your time and take a few normal breaths between deep breaths. 9. The spirometer may include an indicator to show your best effort. Use the indicator as a goal to work toward during each repetition. 10. After each set of 10 deep breaths, practice coughing to be sure your lungs are clear. If you have an incision (the cut made at the time of surgery), support your incision when coughing by placing a pillow or rolled up towels firmly  against it. Once you are able to get out of bed, walk around indoors and cough well. You may stop using the incentive spirometer when instructed by your caregiver.  RISKS AND COMPLICATIONS  Take your time so you do not get dizzy or light-headed.  If you are in pain, you may need to take or ask for pain medication before doing incentive spirometry. It is harder to take a deep breath if you are having pain. AFTER USE  Rest and breathe slowly and easily.  It can be helpful to keep track of a log of your progress. Your caregiver can provide you with a simple table to help with this. If you are using the spirometer at home, follow these instructions: [place] IF:   You are having difficultly using the spirometer.  You have trouble using the spirometer as often as instructed.  Your pain medication is not giving enough relief while using the spirometer.  You develop fever of 100.5 F (38.1 C) or higher. SEEK IMMEDIATE MEDICAL CARE IF:   You cough up bloody  sputum that had not been present before.  You develop fever of 102 F (38.9 C) or greater.  You develop worsening pain at or near the incision site. MAKE SURE YOU:   Understand these instructions.  Will watch your condition.  Will get help right away if you are not doing well or get worse. Document Released: 11/07/2006 Document Revised: 09/19/2011 Document Reviewed: 01/08/2007 Exit[employer] Patient Information 2014 Exit[employer], [place].   ________________________________________________________________________  WHAT IS A BLOOD TRANSFUSION? Blood Transfusion Information  A transfusion is the replacement of blood or some of its parts. Blood is made up of multiple cells which provide different functions.  Red blood cells carry oxygen and are used for blood loss replacement.  White blood cells fight against infection.  Platelets control bleeding.  Plasma helps clot blood.  Other blood products are available for  specialized needs, such as hemophilia or other clotting disorders. BEFORE THE TRANSFUSION  Who gives blood for transfusions?   Healthy volunteers who are fully evaluated to make sure their blood is safe. This is blood bank blood. Transfusion therapy is the safest it has ever been in the practice of medicine. Before blood is taken from a donor, a complete history is taken to make sure that person has no history of diseases nor engages in risky social behavior (examples are intravenous drug use or sexual activity with multiple partners). The donor's travel history is screened to minimize risk of transmitting infections, such as malaria. The donated blood is tested for signs of infectious diseases, such as HIV and hepatitis. The blood is then tested to be sure it is compatible with you in order to minimize the chance of a transfusion reaction. If you or a relative donates blood, this is often done in anticipation of surgery and is not appropriate for emergency situations. It takes many days to process the donated blood. RISKS AND COMPLICATIONS Although transfusion therapy is very safe and saves many lives, the main dangers of transfusion include:   Getting an infectious disease.  Developing a transfusion reaction. This is an allergic reaction to something in the blood you were given. Every precaution is taken to prevent this. The decision to have a blood transfusion has been considered carefully by your caregiver before blood is given. Blood is not given unless the benefits outweigh the risks. AFTER THE TRANSFUSION  Right after receiving a blood transfusion, you will usually feel much better and more energetic. This is especially true if your red blood cells have gotten low (anemic). The transfusion raises the level of the red blood cells which carry oxygen, and this usually causes an energy increase.  The nurse administering the transfusion will monitor you carefully for complications. HOME CARE  INSTRUCTIONS  No special instructions are needed after a transfusion. You may find your energy is better. Speak with your caregiver about any limitations on activity for underlying diseases you may have. SEEK MEDICAL CARE IF:   Your condition is not improving after your transfusion.  You develop redness or irritation at the intravenous (IV) site. SEEK IMMEDIATE MEDICAL CARE IF:  Any of the following symptoms occur over the next 12 hours:  Shaking chills.  You have a temperature by mouth above 102 F (38.9 C), not controlled by medicine.  Chest, back, or muscle pain.  People around you feel you are not acting correctly or are confused.  Shortness of breath or difficulty breathing.  Dizziness and fainting.  You get a rash or develop hives.  You have a  decrease in urine output.  Your urine turns a dark color or changes to pink, red, or brown. Any of the following symptoms occur over the next 10 days:  You have a temperature by mouth above 102 F (38.9 C), not controlled by medicine.  Shortness of breath.  Weakness after normal activity.  The white part of the eye turns yellow (jaundice).  You have a decrease in the amount of urine or are urinating less often.  Your urine turns a dark color or changes to pink, red, or brown. Document Released: 06/24/2000 Document Revised: 09/19/2011 Document Reviewed: 02/11/2008 [facility] Patient Information 2014 [place], [place].  _______________________________________________________________________

## 2017-01-19 ENCOUNTER — Encounter (HOSPITAL_COMMUNITY)
Admission: RE | Admit: 2017-01-19 | Discharge: 2017-01-19 | Disposition: A | Discharge status: Home / Self Care | Payer: Medicare HMO | Source: Ambulatory Visit | Attending: Orthopedic Surgery | Admitting: Orthopedic Surgery

## 2017-01-19 ENCOUNTER — Encounter (HOSPITAL_COMMUNITY): Payer: Self-pay

## 2017-01-19 ENCOUNTER — Encounter (INDEPENDENT_AMBULATORY_CARE_PROVIDER_SITE_OTHER): Payer: Self-pay

## 2017-01-19 DIAGNOSIS — M1711 Unilateral primary osteoarthritis, right knee: Secondary | ICD-10-CM | POA: Insufficient documentation

## 2017-01-19 DIAGNOSIS — Z01818 Encounter for other preprocedural examination: Secondary | ICD-10-CM | POA: Diagnosis present

## 2017-01-19 DIAGNOSIS — Z7984 Long term (current) use of oral hypoglycemic drugs: Secondary | ICD-10-CM | POA: Insufficient documentation

## 2017-01-19 DIAGNOSIS — Z79899 Other long term (current) drug therapy: Secondary | ICD-10-CM | POA: Insufficient documentation

## 2017-01-19 LAB — CBC
HCT: 41.2 % (ref 39.0–52.0)
Hemoglobin: 14 g/dL (ref 13.0–17.0)
MCH: 29.2 pg (ref 26.0–34.0)
MCHC: 34 g/dL (ref 30.0–36.0)
MCV: 86 fL (ref 78.0–100.0)
PLATELETS: 157 10*3/uL (ref 150–400)
RBC: 4.79 MIL/uL (ref 4.22–5.81)
RDW: 13.8 % (ref 11.5–15.5)
WBC: 4.9 10*3/uL (ref 4.0–10.5)

## 2017-01-19 LAB — BASIC METABOLIC PANEL
ANION GAP: 8 (ref 5–15)
BUN: 20 mg/dL (ref 6–20)
CO2: 29 mmol/L (ref 22–32)
Calcium: 9.4 mg/dL (ref 8.9–10.3)
Chloride: 105 mmol/L (ref 101–111)
Creatinine, Ser: 1.13 mg/dL (ref 0.61–1.24)
GFR calc Af Amer: >60 mL/min (ref >60)
GLUCOSE: 77 mg/dL (ref 65–99)
POTASSIUM: 4.4 mmol/L (ref 3.5–5.1)
SODIUM: 142 mmol/L (ref 135–145)

## 2017-01-19 LAB — SURGICAL PCR SCREEN
MRSA, PCR: NEGATIVE
Staphylococcus aureus: NEGATIVE

## 2017-01-19 LAB — GLUCOSE, CAPILLARY: Glucose-Capillary: 103 mg/dL — ABNORMAL HIGH (ref 65–99)

## 2017-01-22 NOTE — H&P (Signed)
TOTAL KNEE ADMISSION H&P  Patient is being admitted for right total knee arthroplasty.  Subjective:  Chief Complaint:  Right knee primary OA /pain  HPI: [name] [name], 71 y.o. male, has a history of pain and functional disability in the right knee due to arthritis and has failed non-surgical conservative treatments for greater than 12 weeks to include NSAID's and/or analgesics and activity modification.  Onset of symptoms was gradual, starting 20+ years ago with gradually worsening course since that time. The patient noted prior procedures on the knee to include  arthroscopy and menisectomy on the right knee(s).  Patient currently rates pain in the right knee(s) at 8 out of 10 with activity. Patient has worsening of pain with activity and weight bearing, pain that interferes with activities of daily living, pain with passive range of motion, crepitus and joint swelling.  Patient has evidence of periarticular osteophytes and joint space narrowing by imaging studies.There is no active infection.   Risks, benefits and expectations were discussed with the patient.  Risks including but not limited to the risk of anesthesia, blood clots, nerve damage, blood vessel damage, failure of the prosthesis, infection and up to and including death.  Patient understand the risks, benefits and expectations and wishes to proceed with surgery.    PCP: Jackie Plum, MD  D/C Plans:       Home   Post-op Meds:       No Rx given  Tranexamic Acid:      To be given - IV   Decadron:      Is to be given  FYI:     ASA APAP (Tylox has worked previously)  DME:   Pt already has equipment  PT:   OPPT    Patient Active Problem List   Diagnosis Date Noted  . Bright red rectal bleeding   . Diverticulosis of colon with hemorrhage 02/15/2015  . GI bleed due to NSAIDs 02/15/2015  . DIABETES, TYPE 2 09/22/2010  . ALLERGIC RHINITIS 04/18/2007  . ASTHMA 04/18/2007   Past Medical History:  Diagnosis  Date  . Allergic rhinitis   . Asthma   . Chronic kidney disease    kidney stones  . Complication of anesthesia    Difficult arousing; c/o some trouble breathing when waking up ; says "they need to sit me up that helps "  . Diabetes mellitus type II   . Diverticulosis of colon with hemorrhage 02/15/2015  . Dysrhythmia    tachycardia  . Headache   . Hypertension     Past Surgical History:  Procedure Laterality Date  . CERVICAL FUSION  10/13/2010   Dr. Lovell Sheehan  . COLONOSCOPY WITH PROPOFOL N/A 02/16/2015   Procedure: COLONOSCOPY WITH PROPOFOL;  Surgeon: Iva Boop, MD;  Location: [facility] ENDOSCOPY;  Service: Endoscopy;  Laterality: N/A;  . CYSTOSCOPY W/ URETERAL STENT REMOVAL  2010  . CYSTOSCOPY WITH RETROGRADE PYELOGRAM, URETEROSCOPY AND STENT PLACEMENT  2010  . EYE SURGERY  2008   Bilateral catract extraction  . KNEE ARTHROSCOPY  80's and 90's    No prescriptions prior to admission.   Allergies  Allergen Reactions  . Dilaudid [Hydromorphone Hcl] Shortness Of Breath  . Hydrocodone Shortness Of Breath  . Percocet [Oxycodone-Acetaminophen] Shortness Of Breath  . Tramadol Shortness Of Breath  . Penicillins Other (See Comments)    Passes out Has patient had a PCN reaction causing immediate rash, facial/tongue/throat swelling, SOB or lightheadedness with hypotension: unknown Has patient had a PCN reaction causing severe  rash involving mucus membranes or skin necrosis: unknown Has patient had a PCN reaction that required hospitalization no Has patient had a PCN reaction occurring within the last 10 years: no If all of the above answers are "NO", then may proceed with Cephalosporin use.    Social History  Substance Use Topics  . Smoking status: Former Smoker    Packs/day: 1.00    Years: 15.00    Quit date: 07/11/1978  . Smokeless tobacco: Never Used  . Alcohol use 1.8 oz/week    3 Cans of beer per week    Family History  Problem Relation Age of Onset  . Heart attack Unknown   .  Diabetes Unknown      Review of Systems  Constitutional: Negative.   HENT: Negative.   Eyes: Negative.   Respiratory: Negative.   Cardiovascular: Negative.   Gastrointestinal: Negative.   Genitourinary: Negative.   Musculoskeletal: Positive for joint pain.  Skin: Negative.   Neurological: Negative.   Endo/Heme/Allergies: Positive for environmental allergies.  Psychiatric/Behavioral: Negative.     Objective:  Physical Exam  Constitutional: He is oriented to person, place, and time. He appears well-developed.  HENT:  Head: Normocephalic.  Eyes: Pupils are equal, round, and reactive to light.  Neck: Neck supple. No JVD present. No tracheal deviation present. No thyromegaly present.  Cardiovascular: Normal rate, regular rhythm and intact distal pulses.   Respiratory: Effort normal and breath sounds normal. No respiratory distress. He has no wheezes.  GI: Soft. There is no tenderness. There is no guarding.  Musculoskeletal:       Right knee: He exhibits decreased range of motion, swelling and bony tenderness. He exhibits no ecchymosis, no deformity, no laceration and no erythema. Tenderness found.  Lymphadenopathy:    He has no cervical adenopathy.  Neurological: He is alert and oriented to person, place, and time.  Skin: Skin is warm and dry.  Psychiatric: He has a normal mood and affect.      Labs:  Estimated body mass index is 22.33 kg/m as calculated from the following:   Height as of 01/19/17: 5\' 7"  (1.702 m).   Weight as of 01/19/17: 64.7 kg (142 lb 9.6 oz).   Imaging Review Plain radiographs demonstrate severe degenerative joint disease of the right knee(s).  The bone quality appears to be good for age and reported activity level.  Assessment/Plan:  End stage arthritis, right knee   The patient history, physical examination, clinical judgment of the provider and imaging studies are consistent with end stage degenerative joint disease of the right knee(s) and  total knee arthroplasty is deemed medically necessary. The treatment options including medical management, injection therapy arthroscopy and arthroplasty were discussed at length. The risks and benefits of total knee arthroplasty were presented and reviewed. The risks due to aseptic loosening, infection, stiffness, patella tracking problems, thromboembolic complications and other imponderables were discussed. The patient acknowledged the explanation, agreed to proceed with the plan and consent was signed. Patient is being admitted for inpatient treatment for surgery, pain control, PT, OT, prophylactic antibiotics, VTE prophylaxis, progressive ambulation and ADL's and discharge planning. The patient is planning to be discharged home.      Anastasio AuerbachMatthew S. Charleton Deyoung   PA-C  01/22/2017, 9:21 PM

## 2017-01-24 ENCOUNTER — Inpatient Hospital Stay (HOSPITAL_COMMUNITY): Payer: Medicare HMO | Admitting: Anesthesiology

## 2017-01-24 ENCOUNTER — Encounter (HOSPITAL_COMMUNITY): Payer: Self-pay

## 2017-01-24 ENCOUNTER — Encounter (HOSPITAL_COMMUNITY)
Admission: RE | Disposition: A | Discharge status: Home / Self Care | Payer: Self-pay | Source: Ambulatory Visit | Attending: Orthopedic Surgery

## 2017-01-24 ENCOUNTER — Observation Stay (HOSPITAL_COMMUNITY)
Admission: RE | Admit: 2017-01-24 | Discharge: 2017-01-25 | Disposition: A | Discharge status: Home / Self Care | Payer: Medicare HMO | Source: Ambulatory Visit | Attending: Orthopedic Surgery | Admitting: Orthopedic Surgery

## 2017-01-24 DIAGNOSIS — G8918 Other acute postprocedural pain: Secondary | ICD-10-CM | POA: Diagnosis not present

## 2017-01-24 DIAGNOSIS — Z8249 Family history of ischemic heart disease and other diseases of the circulatory system: Secondary | ICD-10-CM | POA: Diagnosis not present

## 2017-01-24 DIAGNOSIS — Z981 Arthrodesis status: Secondary | ICD-10-CM | POA: Diagnosis not present

## 2017-01-24 DIAGNOSIS — N189 Chronic kidney disease, unspecified: Secondary | ICD-10-CM | POA: Diagnosis not present

## 2017-01-24 DIAGNOSIS — Z7952 Long term (current) use of systemic steroids: Secondary | ICD-10-CM | POA: Insufficient documentation

## 2017-01-24 DIAGNOSIS — Z886 Allergy status to analgesic agent status: Secondary | ICD-10-CM | POA: Insufficient documentation

## 2017-01-24 DIAGNOSIS — Z7984 Long term (current) use of oral hypoglycemic drugs: Secondary | ICD-10-CM | POA: Diagnosis not present

## 2017-01-24 DIAGNOSIS — Z9889 Other specified postprocedural states: Secondary | ICD-10-CM | POA: Diagnosis not present

## 2017-01-24 DIAGNOSIS — I129 Hypertensive chronic kidney disease with stage 1 through stage 4 chronic kidney disease, or unspecified chronic kidney disease: Secondary | ICD-10-CM | POA: Insufficient documentation

## 2017-01-24 DIAGNOSIS — Z880 Allergy status to penicillin: Secondary | ICD-10-CM | POA: Insufficient documentation

## 2017-01-24 DIAGNOSIS — Z885 Allergy status to narcotic agent status: Secondary | ICD-10-CM | POA: Insufficient documentation

## 2017-01-24 DIAGNOSIS — Z833 Family history of diabetes mellitus: Secondary | ICD-10-CM | POA: Insufficient documentation

## 2017-01-24 DIAGNOSIS — Z79899 Other long term (current) drug therapy: Secondary | ICD-10-CM | POA: Insufficient documentation

## 2017-01-24 DIAGNOSIS — Z7951 Long term (current) use of inhaled steroids: Secondary | ICD-10-CM | POA: Diagnosis not present

## 2017-01-24 DIAGNOSIS — M1711 Unilateral primary osteoarthritis, right knee: Secondary | ICD-10-CM | POA: Diagnosis not present

## 2017-01-24 DIAGNOSIS — M25761 Osteophyte, right knee: Secondary | ICD-10-CM | POA: Diagnosis not present

## 2017-01-24 DIAGNOSIS — Z96659 Presence of unspecified artificial knee joint: Secondary | ICD-10-CM

## 2017-01-24 DIAGNOSIS — Z87891 Personal history of nicotine dependence: Secondary | ICD-10-CM | POA: Diagnosis not present

## 2017-01-24 DIAGNOSIS — E1122 Type 2 diabetes mellitus with diabetic chronic kidney disease: Secondary | ICD-10-CM | POA: Diagnosis not present

## 2017-01-24 DIAGNOSIS — Z96651 Presence of right artificial knee joint: Secondary | ICD-10-CM

## 2017-01-24 DIAGNOSIS — E119 Type 2 diabetes mellitus without complications: Secondary | ICD-10-CM | POA: Diagnosis not present

## 2017-01-24 DIAGNOSIS — J45909 Unspecified asthma, uncomplicated: Secondary | ICD-10-CM | POA: Diagnosis not present

## 2017-01-24 HISTORY — PX: TOTAL KNEE ARTHROPLASTY: SHX125

## 2017-01-24 LAB — TYPE AND SCREEN
ABO/RH(D): A POS
ANTIBODY SCREEN: NEGATIVE

## 2017-01-24 LAB — GLUCOSE, CAPILLARY
Glucose-Capillary: 98 mg/dL (ref 65–99)
Glucose-Capillary: 123 mg/dL — ABNORMAL HIGH (ref 65–99)
Glucose-Capillary: 211 mg/dL — ABNORMAL HIGH (ref 65–99)
Glucose-Capillary: 117 mg/dL — ABNORMAL HIGH (ref 65–99)

## 2017-01-24 SURGERY — ARTHROPLASTY, KNEE, TOTAL
Anesthesia: Spinal | Site: Knee | Laterality: Right

## 2017-01-24 MED ORDER — STERILE WATER FOR IRRIGATION IR SOLN
Status: DC | PRN
  Administered 2017-01-24: 2000 mL

## 2017-01-24 MED ORDER — SODIUM CHLORIDE 0.9 % IV SOLN
1000.0000 mg | Freq: Once | INTRAVENOUS | Status: AC
  Administered 2017-01-24: 1000 mg via INTRAVENOUS
  Filled 2017-01-24: qty 1100

## 2017-01-24 MED ORDER — ROPIVACAINE HCL 5 MG/ML IJ SOLN
INTRAMUSCULAR | Status: DC | PRN
  Administered 2017-01-24: 30 mL via PERINEURAL

## 2017-01-24 MED ORDER — IPRATROPIUM-ALBUTEROL 0.5-2.5 (3) MG/3ML IN SOLN: 3.0000 mL | Freq: Every day | RESPIRATORY_TRACT | Status: DC

## 2017-01-24 MED ORDER — METOPROLOL SUCCINATE ER 25 MG PO TB24
25.0000 mg | ORAL_TABLET | ORAL | Status: DC
  Administered 2017-01-25: 25 mg via ORAL
  Filled 2017-01-24: qty 1

## 2017-01-24 MED ORDER — METOCLOPRAMIDE HCL 5 MG PO TABS: 5.0000 mg | ORAL_TABLET | Freq: Three times a day (TID) | ORAL | Status: DC | PRN

## 2017-01-24 MED ORDER — CEFAZOLIN SODIUM-DEXTROSE 2-4 GM/100ML-% IV SOLN
2.0000 g | Freq: Four times a day (QID) | INTRAVENOUS | Status: AC
  Administered 2017-01-24 (×2): 2 g via INTRAVENOUS
  Filled 2017-01-24 (×2): qty 100

## 2017-01-24 MED ORDER — ONDANSETRON HCL 4 MG PO TABS: 4.0000 mg | ORAL_TABLET | Freq: Four times a day (QID) | ORAL | Status: DC | PRN

## 2017-01-24 MED ORDER — SODIUM CHLORIDE 0.9 % IJ SOLN
INTRAMUSCULAR | Status: AC
  Filled 2017-01-24: qty 50

## 2017-01-24 MED ORDER — FENTANYL CITRATE (PF) 100 MCG/2ML IJ SOLN
INTRAMUSCULAR | Status: AC
  Administered 2017-01-24: 50 ug via INTRAVENOUS
  Filled 2017-01-24: qty 2

## 2017-01-24 MED ORDER — LOSARTAN POTASSIUM 50 MG PO TABS: 50.0000 mg | ORAL_TABLET | ORAL | Status: DC

## 2017-01-24 MED ORDER — CEFAZOLIN SODIUM-DEXTROSE 2-4 GM/100ML-% IV SOLN
2.0000 g | INTRAVENOUS | Status: AC
  Administered 2017-01-24: 2 g via INTRAVENOUS

## 2017-01-24 MED ORDER — POLYETHYLENE GLYCOL 3350 17 G PO PACK
17.0000 g | PACK | Freq: Two times a day (BID) | ORAL | Status: DC
  Administered 2017-01-25: 17 g via ORAL
  Filled 2017-01-24: qty 1

## 2017-01-24 MED ORDER — LACTATED RINGERS IV SOLN
INTRAVENOUS | Status: DC
  Administered 2017-01-24: 1000 mL via INTRAVENOUS

## 2017-01-24 MED ORDER — POTASSIUM CITRATE ER 10 MEQ (1080 MG) PO TBCR
10.0000 meq | EXTENDED_RELEASE_TABLET | Freq: Two times a day (BID) | ORAL | Status: DC
  Administered 2017-01-24 – 2017-01-25 (×2): 10 meq via ORAL
  Filled 2017-01-24 (×3): qty 1

## 2017-01-24 MED ORDER — BUPIVACAINE-EPINEPHRINE (PF) 0.25% -1:200000 IJ SOLN
INTRAMUSCULAR | Status: AC
Start: 2017-01-24 — End: 2017-01-24
  Filled 2017-01-24: qty 30

## 2017-01-24 MED ORDER — ALUM & MAG HYDROXIDE-SIMETH 200-200-20 MG/5ML PO SUSP: 15.0000 mL | ORAL | Status: DC | PRN

## 2017-01-24 MED ORDER — TRANEXAMIC ACID 1000 MG/10ML IV SOLN
1000.0000 mg | INTRAVENOUS | Status: AC
  Administered 2017-01-24: 1000 mg via INTRAVENOUS
  Filled 2017-01-24: qty 1100

## 2017-01-24 MED ORDER — POTASSIUM CITRATE ER 15 MEQ (1620 MG) PO TBCR: 1.0000 | EXTENDED_RELEASE_TABLET | Freq: Two times a day (BID) | ORAL | Status: DC

## 2017-01-24 MED ORDER — ALBUTEROL SULFATE (2.5 MG/3ML) 0.083% IN NEBU: 2.5000 mg | INHALATION_SOLUTION | Freq: Every day | RESPIRATORY_TRACT | Status: DC | PRN

## 2017-01-24 MED ORDER — FENTANYL CITRATE (PF) 100 MCG/2ML IJ SOLN
INTRAMUSCULAR | Status: DC | PRN
  Administered 2017-01-24: 50 ug via INTRAVENOUS

## 2017-01-24 MED ORDER — BUPIVACAINE IN DEXTROSE 0.75-8.25 % IT SOLN
INTRATHECAL | Status: DC | PRN
  Administered 2017-01-24: 1.8 mL via INTRATHECAL

## 2017-01-24 MED ORDER — METFORMIN HCL 500 MG PO TABS
500.0000 mg | ORAL_TABLET | Freq: Two times a day (BID) | ORAL | Status: DC
  Administered 2017-01-24 – 2017-01-25 (×2): 500 mg via ORAL
  Filled 2017-01-24 (×2): qty 1

## 2017-01-24 MED ORDER — METHOCARBAMOL 500 MG PO TABS: 500.0000 mg | ORAL_TABLET | Freq: Four times a day (QID) | ORAL | Status: DC | PRN

## 2017-01-24 MED ORDER — PROPOFOL 500 MG/50ML IV EMUL
INTRAVENOUS | Status: DC | PRN
  Administered 2017-01-24: 75 ug/kg/min via INTRAVENOUS

## 2017-01-24 MED ORDER — SODIUM CHLORIDE 0.9 % IJ SOLN
INTRAMUSCULAR | Status: DC | PRN
  Administered 2017-01-24: 30 mL

## 2017-01-24 MED ORDER — PROPOFOL 10 MG/ML IV BOLUS
INTRAVENOUS | Status: DC | PRN
  Administered 2017-01-24: 20 mg via INTRAVENOUS

## 2017-01-24 MED ORDER — LACTATED RINGERS IV SOLN: INTRAVENOUS | Status: DC

## 2017-01-24 MED ORDER — BUPIVACAINE-EPINEPHRINE (PF) 0.25% -1:200000 IJ SOLN
INTRAMUSCULAR | Status: DC | PRN
  Administered 2017-01-24: 30 mL

## 2017-01-24 MED ORDER — MAGNESIUM CITRATE PO SOLN: 1.0000 | Freq: Once | ORAL | Status: DC | PRN

## 2017-01-24 MED ORDER — PREDNISONE 20 MG PO TABS: 20.0000 mg | ORAL_TABLET | Freq: Every day | ORAL | Status: DC | PRN

## 2017-01-24 MED ORDER — FERROUS SULFATE 325 (65 FE) MG PO TABS
325.0000 mg | ORAL_TABLET | Freq: Three times a day (TID) | ORAL | Status: DC
  Administered 2017-01-25 (×2): 325 mg via ORAL
  Filled 2017-01-24 (×3): qty 1

## 2017-01-24 MED ORDER — MIDAZOLAM HCL 5 MG/ML IJ SOLN
1.0000 mg | INTRAMUSCULAR | Status: DC | PRN
  Administered 2017-01-24: 1 mg via INTRAVENOUS

## 2017-01-24 MED ORDER — ASPIRIN 81 MG PO CHEW
81.0000 mg | CHEWABLE_TABLET | Freq: Two times a day (BID) | ORAL | Status: DC
  Administered 2017-01-24 – 2017-01-25 (×2): 81 mg via ORAL
  Filled 2017-01-24 (×2): qty 1

## 2017-01-24 MED ORDER — CHLORHEXIDINE GLUCONATE 4 % EX LIQD: 60.0000 mL | Freq: Once | CUTANEOUS | Status: DC

## 2017-01-24 MED ORDER — MENTHOL 3 MG MT LOZG: 1.0000 | LOZENGE | OROMUCOSAL | Status: DC | PRN

## 2017-01-24 MED ORDER — CELECOXIB 200 MG PO CAPS
200.0000 mg | ORAL_CAPSULE | Freq: Two times a day (BID) | ORAL | Status: DC
  Administered 2017-01-24 – 2017-01-25 (×2): 200 mg via ORAL
  Filled 2017-01-24 (×2): qty 1

## 2017-01-24 MED ORDER — DEXAMETHASONE SODIUM PHOSPHATE 10 MG/ML IJ SOLN
INTRAMUSCULAR | Status: AC
  Filled 2017-01-24: qty 1

## 2017-01-24 MED ORDER — PHENYLEPHRINE HCL 10 MG/ML IJ SOLN
INTRAVENOUS | Status: DC | PRN
  Administered 2017-01-24: 40 ug/min via INTRAVENOUS

## 2017-01-24 MED ORDER — BISACODYL 10 MG RE SUPP: 10.0000 mg | Freq: Every day | RECTAL | Status: DC | PRN

## 2017-01-24 MED ORDER — BUPIVACAINE-EPINEPHRINE (PF) 0.5% -1:200000 IJ SOLN
INTRAMUSCULAR | Status: DC | PRN
  Administered 2017-01-24: 30 mL via PERINEURAL

## 2017-01-24 MED ORDER — DEXAMETHASONE SODIUM PHOSPHATE 10 MG/ML IJ SOLN
10.0000 mg | Freq: Once | INTRAMUSCULAR | Status: AC
  Administered 2017-01-24: 10 mg via INTRAVENOUS

## 2017-01-24 MED ORDER — FENTANYL CITRATE (PF) 100 MCG/2ML IJ SOLN: 25.0000 ug | INTRAMUSCULAR | Status: DC | PRN

## 2017-01-24 MED ORDER — ACETAMINOPHEN 500 MG PO TABS
1000.0000 mg | ORAL_TABLET | Freq: Three times a day (TID) | ORAL | Status: DC
  Administered 2017-01-24 – 2017-01-25 (×3): 1000 mg via ORAL
  Filled 2017-01-24 (×3): qty 2

## 2017-01-24 MED ORDER — KETOROLAC TROMETHAMINE 30 MG/ML IJ SOLN
INTRAMUSCULAR | Status: DC | PRN
  Administered 2017-01-24: 30 mg

## 2017-01-24 MED ORDER — MIDAZOLAM HCL 2 MG/2ML IJ SOLN
INTRAMUSCULAR | Status: AC
  Filled 2017-01-24: qty 2

## 2017-01-24 MED ORDER — ONDANSETRON HCL 4 MG/2ML IJ SOLN: 4.0000 mg | Freq: Four times a day (QID) | INTRAMUSCULAR | Status: DC | PRN

## 2017-01-24 MED ORDER — INSULIN ASPART 100 UNIT/ML ~~LOC~~ SOLN
0.0000 [IU] | Freq: Three times a day (TID) | SUBCUTANEOUS | Status: DC
  Administered 2017-01-24: 5 [IU] via SUBCUTANEOUS

## 2017-01-24 MED ORDER — MEPERIDINE HCL 50 MG/ML IJ SOLN: 6.2500 mg | INTRAMUSCULAR | Status: DC | PRN

## 2017-01-24 MED ORDER — METOCLOPRAMIDE HCL 5 MG/ML IJ SOLN
5.0000 mg | Freq: Three times a day (TID) | INTRAMUSCULAR | Status: DC | PRN
Start: 2017-01-24 — End: 2017-01-25

## 2017-01-24 MED ORDER — SODIUM CHLORIDE 0.9 % IV SOLN
INTRAVENOUS | Status: DC
  Administered 2017-01-24: 16:00:00 via INTRAVENOUS

## 2017-01-24 MED ORDER — 0.9 % SODIUM CHLORIDE (POUR BTL) OPTIME
TOPICAL | Status: DC | PRN
  Administered 2017-01-24: 1000 mL

## 2017-01-24 MED ORDER — PHENYLEPHRINE HCL 10 MG/ML IJ SOLN
INTRAMUSCULAR | Status: AC
Start: 2017-01-24 — End: 2017-01-24
  Filled 2017-01-24: qty 1

## 2017-01-24 MED ORDER — CEFAZOLIN SODIUM-DEXTROSE 2-4 GM/100ML-% IV SOLN
INTRAVENOUS | Status: AC
  Filled 2017-01-24: qty 100

## 2017-01-24 MED ORDER — KETOROLAC TROMETHAMINE 30 MG/ML IJ SOLN
INTRAMUSCULAR | Status: AC
  Filled 2017-01-24: qty 1

## 2017-01-24 MED ORDER — PROMETHAZINE HCL 25 MG/ML IJ SOLN: 6.2500 mg | INTRAMUSCULAR | Status: DC | PRN

## 2017-01-24 MED ORDER — PROPOFOL 10 MG/ML IV BOLUS
INTRAVENOUS | Status: AC
  Filled 2017-01-24: qty 60

## 2017-01-24 MED ORDER — FENTANYL CITRATE (PF) 100 MCG/2ML IJ SOLN
INTRAMUSCULAR | Status: AC
  Filled 2017-01-24: qty 2

## 2017-01-24 MED ORDER — DIPHENHYDRAMINE HCL 25 MG PO CAPS: 25.0000 mg | ORAL_CAPSULE | Freq: Four times a day (QID) | ORAL | Status: DC | PRN

## 2017-01-24 MED ORDER — ONDANSETRON HCL 4 MG/2ML IJ SOLN
INTRAMUSCULAR | Status: DC | PRN
  Administered 2017-01-24: 4 mg via INTRAVENOUS

## 2017-01-24 MED ORDER — METHOCARBAMOL 1000 MG/10ML IJ SOLN
500.0000 mg | Freq: Four times a day (QID) | INTRAMUSCULAR | Status: DC | PRN
  Administered 2017-01-24: 500 mg via INTRAVENOUS
  Filled 2017-01-24: qty 550

## 2017-01-24 MED ORDER — DEXAMETHASONE SODIUM PHOSPHATE 10 MG/ML IJ SOLN
10.0000 mg | Freq: Once | INTRAMUSCULAR | Status: AC
  Administered 2017-01-25: 10 mg via INTRAVENOUS
  Filled 2017-01-24: qty 1

## 2017-01-24 MED ORDER — DOCUSATE SODIUM 100 MG PO CAPS
100.0000 mg | ORAL_CAPSULE | Freq: Two times a day (BID) | ORAL | Status: DC
  Administered 2017-01-24 – 2017-01-25 (×2): 100 mg via ORAL
  Filled 2017-01-24 (×2): qty 1

## 2017-01-24 MED ORDER — IPRATROPIUM-ALBUTEROL 20-100 MCG/ACT IN AERS
1.0000 | INHALATION_SPRAY | Freq: Every day | RESPIRATORY_TRACT | Status: DC
  Filled 2017-01-24: qty 4

## 2017-01-24 MED ORDER — ONDANSETRON HCL 4 MG/2ML IJ SOLN
INTRAMUSCULAR | Status: AC
  Filled 2017-01-24: qty 2

## 2017-01-24 MED ORDER — PHENOL 1.4 % MT LIQD: 1.0000 | OROMUCOSAL | Status: DC | PRN

## 2017-01-24 MED ORDER — FENTANYL CITRATE (PF) 100 MCG/2ML IJ SOLN
50.0000 ug | INTRAMUSCULAR | Status: DC | PRN
  Administered 2017-01-24: 50 ug via INTRAVENOUS

## 2017-01-24 SURGICAL SUPPLY — 48 items
ADH SKN CLS APL DERMABOND .7 (GAUZE/BANDAGES/DRESSINGS) ×1
BAG DECANTER FOR FLEXI CONT (MISCELLANEOUS) IMPLANT
BAG SPEC THK2 15X12 ZIP CLS (MISCELLANEOUS)
BAG ZIPLOCK 12X15 (MISCELLANEOUS) IMPLANT
BANDAGE ACE 6X5 VEL STRL LF (GAUZE/BANDAGES/DRESSINGS) ×2 IMPLANT
BLADE SAW SGTL 11.0X1.19X90.0M (BLADE) IMPLANT
BLADE SAW SGTL 13.0X1.19X90.0M (BLADE) ×2 IMPLANT
BOWL SMART MIX CTS (DISPOSABLE) ×2 IMPLANT
CAPT KNEE TOTAL 3 ATTUNE ×1 IMPLANT
CEMENT HV SMART SET (Cement) ×2 IMPLANT
COVER SURGICAL LIGHT HANDLE (MISCELLANEOUS) ×2 IMPLANT
CUFF TOURN SGL QUICK 34 (TOURNIQUET CUFF) ×2
CUFF TRNQT CYL 34X4X40X1 (TOURNIQUET CUFF) ×1 IMPLANT
DECANTER SPIKE VIAL GLASS SM (MISCELLANEOUS) ×2 IMPLANT
DERMABOND ADVANCED (GAUZE/BANDAGES/DRESSINGS) ×1
DERMABOND ADVANCED .7 DNX12 (GAUZE/BANDAGES/DRESSINGS) ×1 IMPLANT
DRAPE U-SHAPE 47X51 STRL (DRAPES) ×2 IMPLANT
DRESSING AQUACEL AG SP 3.5X10 (GAUZE/BANDAGES/DRESSINGS) ×1 IMPLANT
DRSG AQUACEL AG SP 3.5X10 (GAUZE/BANDAGES/DRESSINGS) ×2
DURAPREP 26ML APPLICATOR (WOUND CARE) ×4 IMPLANT
ELECT REM PT RETURN 15FT ADLT (MISCELLANEOUS) ×2 IMPLANT
GLOVE BIOGEL M 7.0 STRL (GLOVE) IMPLANT
GLOVE BIOGEL M STRL SZ7.5 (GLOVE) ×1 IMPLANT
GLOVE BIOGEL PI IND STRL 7.5 (GLOVE) ×1 IMPLANT
GLOVE BIOGEL PI IND STRL 8.5 (GLOVE) ×1 IMPLANT
GLOVE BIOGEL PI INDICATOR 7.5 (GLOVE) ×2
GLOVE BIOGEL PI INDICATOR 8.5 (GLOVE)
GLOVE ECLIPSE 8.0 STRL XLNG CF (GLOVE) ×1 IMPLANT
GLOVE ORTHO TXT STRL SZ7.5 (GLOVE) ×3 IMPLANT
GOWN STRL REUS W/TWL LRG LVL3 (GOWN DISPOSABLE) ×3 IMPLANT
GOWN STRL REUS W/TWL XL LVL3 (GOWN DISPOSABLE) ×1 IMPLANT
HANDPIECE INTERPULSE COAX TIP (DISPOSABLE) ×2
MANIFOLD NEPTUNE II (INSTRUMENTS) ×2 IMPLANT
PACK TOTAL KNEE CUSTOM (KITS) ×2 IMPLANT
POSITIONER SURGICAL ARM (MISCELLANEOUS) ×2 IMPLANT
SET HNDPC FAN SPRY TIP SCT (DISPOSABLE) ×1 IMPLANT
SET PAD KNEE POSITIONER (MISCELLANEOUS) ×2 IMPLANT
SUT MNCRL AB 4-0 PS2 18 (SUTURE) ×2 IMPLANT
SUT STRATAFIX 0 PDS 27 VIOLET (SUTURE) ×2
SUT VIC AB 1 CT1 36 (SUTURE) ×2 IMPLANT
SUT VIC AB 2-0 CT1 27 (SUTURE) ×6
SUT VIC AB 2-0 CT1 TAPERPNT 27 (SUTURE) ×3 IMPLANT
SUTURE STRATFX 0 PDS 27 VIOLET (SUTURE) ×1 IMPLANT
SYR 50ML LL SCALE MARK (SYRINGE) ×1 IMPLANT
TRAY FOLEY W/METER SILVER 16FR (SET/KITS/TRAYS/PACK) ×2 IMPLANT
WATER STERILE IRR 1500ML POUR (IV SOLUTION) ×2 IMPLANT
WRAP KNEE MAXI GEL POST OP (GAUZE/BANDAGES/DRESSINGS) ×2 IMPLANT
YANKAUER SUCT BULB TIP 10FT TU (MISCELLANEOUS) ×2 IMPLANT

## 2017-01-24 NOTE — Anesthesia Procedure Notes (Signed)
Spinal  Patient location during procedure: OR Start time: 01/24/2017 11:24 AM End time: 01/24/2017 11:29 AM Reason for block: at surgeon's request Staffing Resident/CRNA: Anne Fu Performed: resident/CRNA  Preanesthetic Checklist Completed: patient identified, site marked, surgical consent, pre-op evaluation, timeout performed, IV checked, risks and benefits discussed and monitors and equipment checked Spinal Block Patient position: sitting Prep: DuraPrep Patient monitoring: heart rate, continuous pulse ox and blood pressure Approach: right paramedian Location: L2-3 Injection technique: single-shot Needle Needle type: Pencan  Needle gauge: 24 G Needle length: 9 cm Assessment Sensory level: T6 Additional Notes Expiration date of kit checked and confirmed. Patient tolerated procedure well, without complications. X 1 attempt with noted clear CSF return. Loss of motor and sensory on exam post injection.

## 2017-01-24 NOTE — Interval H&P Note (Signed)
History and Physical Interval Note:  01/24/2017 10:13 AM  [name] [name]  has presented today for surgery, with the diagnosis of Right knee osteoarthritis  The various methods of treatment have been discussed with the patient and family. After consideration of risks, benefits and other options for treatment, the patient has consented to  Procedure(s) with comments: RIGHT TOTAL KNEE ARTHROPLASTY (Right) - 70 mins as a surgical intervention .  The patient's history has been reviewed, patient examined, no change in status, stable for surgery.  I have reviewed the patient's chart and labs.  Questions were answered to the patient's satisfaction.     Shelda PalLIN,Marcelle Hepner D

## 2017-01-24 NOTE — Anesthesia Postprocedure Evaluation (Signed)
Anesthesia Post Note  Patient: Rosalio LoudWilliam J Spring  Procedure(s) Performed: Procedure(s) (LRB): RIGHT TOTAL KNEE ARTHROPLASTY (Right)     Patient location during evaluation: PACU Anesthesia Type: Spinal Level of consciousness: oriented and awake and alert Pain management: pain level controlled Vital Signs Assessment: post-procedure vital signs reviewed and stable Respiratory status: spontaneous breathing, respiratory function stable and patient connected to nasal cannula oxygen Cardiovascular status: blood pressure returned to baseline and stable Postop Assessment: no headache, no backache and spinal receding Anesthetic complications: no    Last Vitals:  Vitals:   01/24/17 1500 01/24/17 1506  BP: 132/81 134/70  Pulse: 63 75  Resp: 18 19  Temp:                   Shelton SilvasKevin D Tanyika Barros

## 2017-01-24 NOTE — Progress Notes (Signed)
Assisted Dr. Hollis with right, ultrasound guided, adductor canal block. Side rails up, monitors on throughout procedure. See vital signs in flow sheet. Tolerated Procedure well.  

## 2017-01-24 NOTE — Transfer of Care (Signed)
Immediate Anesthesia Transfer of Care Note  Patient: Rosalio LoudWilliam J Tesoriero  Procedure(s) Performed: Procedure(s) with comments: RIGHT TOTAL KNEE ARTHROPLASTY (Right) - 70 mins  Patient Location: PACU  Anesthesia Type:Spinal  Level of Consciousness:  sedated, patient cooperative and responds to stimulation  Airway & Oxygen Therapy:Patient Spontanous Breathing and Patient connected to face mask oxgen  Post-op Assessment:  Report given to PACU RN and Post -op Vital signs reviewed and stable  Post vital signs:  Reviewed and stable  Last Vitals:  Vitals:   01/24/17 1114 01/24/17 1115  BP:    Pulse: 86 84  Resp: (!) 26 (!) 24  Temp:      Complications: No apparent anesthesia complications

## 2017-01-24 NOTE — Anesthesia Preprocedure Evaluation (Addendum)
Anesthesia Evaluation  Patient identified by MRN, date of birth, ID band Patient awake    Reviewed: Allergy & Precautions, NPO status , Patient's Chart, lab work & pertinent test results, reviewed documented beta blocker date and time   Airway Mallampati: II  TM Distance: >3 FB Neck ROM: Full    Dental  (+) Teeth Intact, Dental Advisory Given   Pulmonary asthma , former smoker,    breath sounds clear to auscultation       Cardiovascular hypertension, Pt. on medications and Pt. on home beta blockers  Rhythm:Regular Rate:Normal     Neuro/Psych  Headaches, negative psych ROS   GI/Hepatic negative GI ROS, Neg liver ROS,   Endo/Other  diabetes, Type 2, Oral Hypoglycemic Agents  Renal/GU Renal disease     Musculoskeletal   Abdominal   Peds  Hematology   Anesthesia Other Findings   Reproductive/Obstetrics                            Anesthesia Physical Anesthesia Plan  ASA: II  Anesthesia Plan: Spinal   Post-op Pain Management:  Regional for Post-op pain   Induction: Intravenous  PONV Risk Score and Plan: 2 and Ondansetron and Dexamethasone  Airway Management Planned: Natural Airway  Additional Equipment:   Intra-op Plan:   Post-operative Plan:   Informed Consent: I have reviewed the patients History and Physical, chart, labs and discussed the procedure including the risks, benefits and alternatives for the proposed anesthesia with the patient or authorized representative who has indicated his/her understanding and acceptance.     Plan Discussed with: CRNA  Anesthesia Plan Comments:         Anesthesia Quick Evaluation

## 2017-01-24 NOTE — Discharge Instructions (Signed)

## 2017-01-24 NOTE — Op Note (Signed)
NAME:  [name] [name]                      MEDICAL RECORD NO.:  [id number]                             FACILITY:  [facility]      PHYSICIAN:  Madlyn Frankel. Charlann Boxer, M.D.  DATE OF BIRTH:  1946-01-30      DATE OF PROCEDURE:  01/24/2017                                     OPERATIVE REPORT         PREOPERATIVE DIAGNOSIS:  Right knee osteoarthritis.      POSTOPERATIVE DIAGNOSIS:  Right knee osteoarthritis.      FINDINGS:  The patient was noted to have complete loss of cartilage and   bone-on-bone arthritis with associated osteophytes in the medial and patellofemoral compartments of   the knee with a significant synovitis and associated effusion.      PROCEDURE:  Right total knee replacement.      COMPONENTS USED:  DePuy Attune rotating platform posterior stabilized knee   system, a size 7 femur, 6 tibia, size 5 PS AOXm insert, and 38 anatomic patellar   button.      SURGEON:  Madlyn Frankel. Charlann Boxer, M.D.      ASSISTANT:  Skip Mayer, PA-C.      ANESTHESIA:  Regional and Spinal.      SPECIMENS:  None.      COMPLICATION:  None.      DRAINS:  None.  EBL: <150cc      TOURNIQUET TIME:   Total Tourniquet Time Documented: area (Right) - 23 minutes Total: area (Right) - 23 minutes  .      The patient was stable to the recovery room.      INDICATION FOR PROCEDURE:  [name] [name] is a 71 y.o. male patient of   mine.  The patient had been seen, evaluated, and treated conservatively in the   office with medication, activity modification, and injections.  The patient had   radiographic changes of bone-on-bone arthritis with endplate sclerosis and osteophytes noted.      The patient failed conservative measures including medication, injections, and activity modification, and at this point was ready for more definitive measures.   Based on the radiographic changes and failed conservative measures, the patient   decided to proceed with total knee replacement.  Risks of infection,   DVT,  component failure, need for revision surgery, postop course, and   expectations were all   discussed and reviewed.  Consent was obtained for benefit of pain   relief.      PROCEDURE IN DETAIL:  The patient was brought to the operative theater.   Once adequate anesthesia, preoperative antibiotics, 2 gm of Ancef, 1 gm of Tranexamic Acid, and 10 mg of Decadron administered, the patient was positioned supine with the right thigh tourniquet placed.  The  right lower extremity was prepped and draped in sterile fashion.  A time-   out was performed identifying the patient, planned procedure, and   extremity.      The right lower extremity was placed in the [facility] leg holder.  The leg was   exsanguinated, tourniquet elevated to 250 mmHg.  A midline incision was  made followed by median parapatellar arthrotomy.  Following initial   exposure, attention was first directed to the patella.  Precut   measurement was noted to be 24 mm.  I resected down to 14 mm and used a   38 anatomic patellar button to restore patellar height as well as cover the cut   surface.      The lug holes were drilled and a metal shim was placed to protect the   patella from retractors and saw blades.      At this point, attention was now directed to the femur.  The femoral   canal was opened with a drill, irrigated to try to prevent fat emboli.  An   intramedullary rod was passed at 5 degrees valgus, 10 mm of bone was   resected off the distal femur due to pre-operative flexion contracture.  Following this resection, the tibia was   subluxated anteriorly.  Using the extramedullary guide, 2 mm of bone was resected off   the proximal medial tibia.  We confirmed the gap would be   stable medially and laterally with a size 5 spacer block as well as confirmed   the cut was perpendicular in the coronal plane, checking with an alignment rod.      Once this was done, I sized the femur to be a size 7 in the anterior-   posterior  dimension, chose a standard component based on medial and   lateral dimension.  The size 7 rotation block was then pinned in   position anterior referenced using the C-clamp to set rotation.  The   anterior, posterior, and  chamfer cuts were made without difficulty nor   notching making certain that I was along the anterior cortex to help   with flexion gap stability.      The final box cut was made off the lateral aspect of distal femur.      At this point, the tibia was sized to be a size 6, the size 6 tray was   then pinned in position through the medial third of the tubercle,   drilled, and keel punched.  Trial reduction was now carried with a 7 femur,  6 tibia, a size 5 PS insert, and the 38 anatomic patella botton.  The knee was brought to   extension, full extension with good flexion stability with the patella   tracking through the trochlea without application of pressure.  Given   all these findings the femoral lug holes were drilled and then the trial components removed.  Final components were   opened and cement was mixed.  The knee was irrigated with normal saline   solution and pulse lavage.  The synovial lining was   then injected with 30 cc of 0.25% Marcaine with epinephrine and 1 cc of Toradol plus 30 cc of NS for an   total of 61 cc.      The knee was irrigated.  Final implants were then cemented onto clean and   dried cut surfaces of bone with the knee brought to extension with a size 5 PS trial insert.      Once the cement had fully cured, the excess cement was removed   throughout the knee.  I confirmed I was satisfied with the range of   motion and stability, and the final size 5 PS AOX insert was chosen.  It was   placed into the knee.      The tourniquet had  been let down at 23 minutes.  No significant   hemostasis required.  The   extensor mechanism was then reapproximated using #1 Vicryl and #0 Stratafix sutures with the knee   in flexion.  The   remaining  wound was closed with 2-0 Vicryl and running 4-0 Monocryl.   The knee was cleaned, dried, dressed sterilely using Dermabond and   Aquacel dressing.  The patient was then   brought to recovery room in stable condition, tolerating the procedure   well.   Please note that Physician Assistant, Skip Mayer, PA-C, was present for the entirety of the case, and was utilized for pre-operative positioning, peri-operative retractor management, general facilitation of the procedure.  He was also utilized for primary wound closure at the end of the case.              Madlyn Frankel Charlann Boxer, M.D.    01/24/2017 12:34 PM

## 2017-01-24 NOTE — Anesthesia Procedure Notes (Signed)
Anesthesia Regional Block: Adductor canal block   Pre-Anesthetic Checklist: ,, timeout performed, Correct Patient, Correct Site, Correct Laterality, Correct Procedure, Correct Position, site marked, Risks and benefits discussed,  Surgical consent,  Pre-op evaluation,  At surgeon's request and post-op pain management  Laterality: Right  Prep: chloraprep       Needles:  Injection technique: Single-shot  Needle Type: Echogenic Needle     Needle Length: 9cm  Needle Gauge: 21     Additional Needles:   Procedures: ultrasound guided,,,,,,,,  Narrative:  Start time: 01/24/2017 10:20 AM End time: 01/24/2017 10:25 AM Injection made incrementally with aspirations every 5 mL.  Performed by: Personally  Anesthesiologist: Shona SimpsonHOLLIS, Deleah Tison D  Additional Notes: Tolerated well.

## 2017-01-25 DIAGNOSIS — M1711 Unilateral primary osteoarthritis, right knee: Secondary | ICD-10-CM | POA: Diagnosis not present

## 2017-01-25 LAB — BASIC METABOLIC PANEL
ANION GAP: 7 (ref 5–15)
BUN: 21 mg/dL — ABNORMAL HIGH (ref 6–20)
CALCIUM: 8.2 mg/dL — AB (ref 8.9–10.3)
CO2: 25 mmol/L (ref 22–32)
Chloride: 109 mmol/L (ref 101–111)
Creatinine, Ser: 1.04 mg/dL (ref 0.61–1.24)
Glucose, Bld: 122 mg/dL — ABNORMAL HIGH (ref 65–99)
Potassium: 4.4 mmol/L (ref 3.5–5.1)
SODIUM: 141 mmol/L (ref 135–145)

## 2017-01-25 LAB — CBC
HEMATOCRIT: 32.3 % — AB (ref 39.0–52.0)
Hemoglobin: 11 g/dL — ABNORMAL LOW (ref 13.0–17.0)
MCH: 28.8 pg (ref 26.0–34.0)
MCHC: 34.1 g/dL (ref 30.0–36.0)
MCV: 84.6 fL (ref 78.0–100.0)
Platelets: 115 10*3/uL — ABNORMAL LOW (ref 150–400)
RBC: 3.82 MIL/uL — ABNORMAL LOW (ref 4.22–5.81)
RDW: 13.9 % (ref 11.5–15.5)
WBC: 10.5 10*3/uL (ref 4.0–10.5)

## 2017-01-25 LAB — GLUCOSE, CAPILLARY
GLUCOSE-CAPILLARY: 105 mg/dL — AB (ref 65–99)
GLUCOSE-CAPILLARY: 102 mg/dL — AB (ref 65–99)

## 2017-01-25 MED ORDER — ACETAMINOPHEN 500 MG PO TABS: 1000.0000 mg | ORAL_TABLET | Freq: Three times a day (TID) | ORAL | 0 refills | Status: DC

## 2017-01-25 MED ORDER — ASPIRIN 81 MG PO CHEW: 81.0000 mg | CHEWABLE_TABLET | Freq: Two times a day (BID) | ORAL | 0 refills | Status: AC

## 2017-01-25 MED ORDER — POLYETHYLENE GLYCOL 3350 17 G PO PACK
17.0000 g | PACK | Freq: Two times a day (BID) | ORAL | 0 refills | Status: DC
Start: 2017-01-25 — End: 2018-01-02

## 2017-01-25 MED ORDER — FERROUS SULFATE 325 (65 FE) MG PO TABS: 325.0000 mg | ORAL_TABLET | Freq: Three times a day (TID) | ORAL | 3 refills | Status: DC

## 2017-01-25 MED ORDER — DOCUSATE SODIUM 100 MG PO CAPS: 100.0000 mg | ORAL_CAPSULE | Freq: Two times a day (BID) | ORAL | 0 refills | Status: DC

## 2017-01-25 MED ORDER — METHOCARBAMOL 500 MG PO TABS: 500.0000 mg | ORAL_TABLET | Freq: Four times a day (QID) | ORAL | 0 refills | Status: DC | PRN

## 2017-01-25 MED ORDER — CELECOXIB 200 MG PO CAPS: 200.0000 mg | ORAL_CAPSULE | Freq: Two times a day (BID) | ORAL | 0 refills | Status: DC

## 2017-01-25 NOTE — Evaluation (Signed)
Physical Therapy Evaluation Patient Details Name: [name] [name] MRN: [id number] DOB: Feb 04, 1946 Today's Date: 01/25/2017   History of Present Illness  Pt is a 71 y.o. male s/p R TKA. PMHx: CKD, DM2, Tachycardia, HTN, Cervical fusion.  Clinical Impression  Pt s/p R TKR and presents with decreased R LE strength/ROM and post op pain limiting functional mobility.  Pt should progress to dc home with family assist.    Follow Up Recommendations DC plan and follow up therapy as arranged by surgeon    Equipment Recommendations  None recommended by PT    Recommendations for Other Services OT consult     Precautions / Restrictions Precautions Precautions: Knee Precaution Booklet Issued: No Restrictions Weight Bearing Restrictions: No RLE Weight Bearing: Weight bearing as tolerated      Mobility  Bed Mobility Overal bed mobility: Needs Assistance Bed Mobility: Supine to Sit     Supine to sit: Supervision     General bed mobility comments: cues for sequence and use of L LE to self assist  Transfers Overall transfer level: Needs assistance Equipment used: Rolling walker (2 wheeled) Transfers: Sit to/from Stand Sit to Stand: Supervision         General transfer comment: cues for LE managment and use of UEs to self assist  Ambulation/Gait Ambulation/Gait assistance: Min assist;Min guard Ambulation Distance (Feet): 100 Feet Assistive device: Rolling walker (2 wheeled) Gait Pattern/deviations: Step-to pattern;Decreased step length - right;Decreased step length - left;Shuffle;Trunk flexed Gait velocity: decr Gait velocity interpretation: Below normal speed for age/gender General Gait Details: cues for posture, position from RW and sequence  Stairs            Wheelchair Mobility    Modified Rankin (Stroke Patients Only)       Balance Overall balance assessment: No apparent balance deficits (not formally assessed)                                            Pertinent Vitals/Pain Pain Assessment: 0-10 Pain Score: 4  Pain Location: R knee Pain Descriptors / Indicators: Aching;Sore Pain Intervention(s): Limited activity within patient's tolerance;Monitored during session;Premedicated before session;Ice applied    Home Living Family/patient expects to be discharged to:: Private residence Living Arrangements: Spouse/significant other Available Help at Discharge: Family;Available 24 hours/day Type of Home: House Home Access: Stairs to enter Entrance Stairs-Rails: None Entrance Stairs-Number of Steps: 3 Home Layout: One level Home Equipment: Walker - 2 wheels;Shower seat;Crutches      Prior Function Level of Independence: Independent               Hand Dominance        Extremity/Trunk Assessment   Upper Extremity Assessment Upper Extremity Assessment: Overall WFL for tasks assessed    Lower Extremity Assessment Lower Extremity Assessment: RLE deficits/detail RLE Deficits / Details: 3/5 quads with IND SLR and AAROM at knee -10 - 95    Cervical / Trunk Assessment Cervical / Trunk Assessment: Normal  Communication   Communication: No difficulties  Cognition Arousal/Alertness: Awake/alert Behavior During Therapy: WFL for tasks assessed/performed Overall Cognitive Status: Within Functional Limits for tasks assessed                                        General Comments  Exercises Total Joint Exercises Ankle Circles/Pumps: AROM;Both;20 reps;Supine Quad Sets: AROM;Both;10 reps;Supine Heel Slides: AAROM;Right;15 reps;Supine Straight Leg Raises: AROM;Right;10 reps;Supine   Assessment/Plan    PT Assessment Patient needs continued PT services  PT Problem List Decreased strength;Decreased range of motion;Decreased activity tolerance;Decreased mobility;Pain;Decreased knowledge of use of DME       PT Treatment Interventions DME instruction;Gait training;Stair training;Functional  mobility training;Therapeutic activities;Therapeutic exercise;Balance training;Patient/family education    PT Goals (Current goals can be found in the Care Plan section)  Acute Rehab PT Goals Patient Stated Goal: return home PT Goal Formulation: With patient Time For Goal Achievement: 01/28/17 Potential to Achieve Goals: Good    Frequency 7X/week   Barriers to discharge        Co-evaluation               AM-PAC PT "6 Clicks" Daily Activity  Outcome Measure Difficulty turning over in bed (including adjusting bedclothes, sheets and blankets)?: A Little Difficulty moving from lying on back to sitting on the side of the bed? : A Little Difficulty sitting down on and standing up from a chair with arms (e.g., wheelchair, bedside commode, etc,.)?: A Little Help needed moving to and from a bed to chair (including a wheelchair)?: A Little Help needed walking in hospital room?: A Little Help needed climbing 3-5 steps with a railing? : A Little 6 Click Score: 18    End of Session Equipment Utilized During Treatment: Gait belt Activity Tolerance: Patient tolerated treatment well Patient left: in chair;with call bell/phone within reach;with family/visitor present Nurse Communication: Mobility status PT Visit Diagnosis: Difficulty in walking, not elsewhere classified (R26.2)    Time: 1610-9604 PT Time Calculation (min) (ACUTE ONLY): 32 min   Charges:   PT Evaluation $PT Eval Low Complexity: 1 Procedure PT Treatments $Therapeutic Exercise: 8-22 mins   PT G Codes:        Pg [phone]   Waneta Fitting 01/25/2017, 12:33 PM

## 2017-01-25 NOTE — Progress Notes (Signed)
     Subjective: 1 Day Post-Op Procedure(s) (LRB): RIGHT TOTAL KNEE ARTHROPLASTY (Right)   Patient reports pain as mild, pain controlled.  No events throughout the night. Ready to be discharged home.   Objective:   VITALS:   Vitals:   01/25/17 0146 01/25/17 0703  BP: (!) 107/57 (!) 111/57  Pulse: 67 66  Resp: 16 16  Temp: 97.8 F (36.6 C) 97.9 F (36.6 C)    Dorsiflexion/Plantar flexion intact Incision: dressing C/D/I No cellulitis present Compartment soft  LABS  Recent Labs  01/25/17 0547  HGB 11.0*  HCT 32.3*  WBC 10.5  PLT 115*     Recent Labs  01/25/17 0547  NA 141  K 4.4  BUN 21*  CREATININE 1.04  GLUCOSE 122*     Assessment/Plan: 1 Day Post-Op Procedure(s) (LRB): RIGHT TOTAL KNEE ARTHROPLASTY (Right) Foley cath d/c'ed Advance diet Up with therapy D/C IV fluids Discharge home [facility] Follow up with OLIN,Haydin Dunn D in 2 weeks.  Contact information:  [facility] [address], [address] [place] [id number] [phone]          Anastasio AuerbachMatthew S. Zechariah Bissonnette   PAC  01/25/2017, 9:12 AM

## 2017-01-25 NOTE — Care Management Note (Signed)
Case Management Note  Patient Details  Name: Rosalio LoudWilliam J Hoobler MRN: [id number] Date of Birth: [birth year]  Subjective/Objective: 71 y/o m admitted for R TKA. From home. D/c plan otpt PT. Already has 3n1,rw.                   Action/Plan:d/c home.   Expected Discharge Date:  01/25/17               Expected Discharge Plan:  OP Rehab  In-House Referral:     Discharge planning Services  CM Consult  Post Acute Care Choice:    Choice offered to:     DME Arranged:    DME Agency:     HH Arranged:    HH Agency:     Status of Service:  Completed, signed off  If discussed at [employer] Length of Stay Meetings, dates discussed:    Additional Comments:  Lanier ClamMahabir, Snow Peoples, RN 01/25/2017, 1:10 PM

## 2017-01-25 NOTE — Evaluation (Signed)
Occupational Therapy Evaluation Patient Details Name: [name] [name] MRN: [id number] DOB: 1945-11-28 Today's Date: 01/25/2017    History of Present Illness Pt is a 71 y.o. male s/p R TKA. PMHx: CKD, DM2, Tachycardia, HTN, Cervical fusion.   Clinical Impression   Pt reports he was independent with ADL PTA. Currently pt overall supervision for ADL and functional mobility. All safety, ADL, and knee education completed with pt and wife. Pt planning to d/c home with 24/7 supervision from family. No further acute OT needs identified; signing off at this time. Please re-consult if needs change. Thank you for this referral.    Follow Up Recommendations  No OT follow up;Supervision - Intermittent    Equipment Recommendations  None recommended by OT    Recommendations for Other Services       Precautions / Restrictions Precautions Precautions: Knee Precaution Booklet Issued: No Restrictions Weight Bearing Restrictions: Yes RLE Weight Bearing: Weight bearing as tolerated      Mobility Bed Mobility               General bed mobility comments: Pt OOB in chair upon arrival.  Transfers Overall transfer level: Needs assistance Equipment used: Rolling walker (2 wheeled) Transfers: Sit to/from Stand Sit to Stand: Supervision         General transfer comment: Good hand placement, no physical assist. Supervision for safety    Balance Overall balance assessment: No apparent balance deficits (not formally assessed)                                         ADL either performed or assessed with clinical judgement   ADL Overall ADL's : Needs assistance/impaired Eating/Feeding: Independent;Sitting   Grooming: Supervision/safety;Standing   Upper Body Bathing: Set up;Sitting   Lower Body Bathing: Supervison/ safety;Sit to/from stand   Upper Body Dressing : Set up;Sitting   Lower Body Dressing: Supervision/safety;Sit to/from stand Lower Body Dressing  Details (indicate cue type and reason): Educated on R leg into clothing first. Pt able to reach R foot. Toilet Transfer: Supervision/safety;Ambulation;Regular Toilet;RW   Toileting- Clothing Manipulation and Hygiene: Supervision/safety;Sit to/from Nurse, children's Details (indicate cue type and reason): Educated on tub transfer technique with use of RW; pt verbalized understanding. Wife to assist as needed. Pt plans to use shower chair for safety initially Functional mobility during ADLs: Supervision/safety;Rolling walker       Vision         Perception     Praxis      Pertinent Vitals/Pain Pain Assessment: No/denies pain     Hand Dominance     Extremity/Trunk Assessment Upper Extremity Assessment Upper Extremity Assessment: Overall WFL for tasks assessed   Lower Extremity Assessment Lower Extremity Assessment: Defer to PT evaluation       Communication Communication Communication: No difficulties   Cognition Arousal/Alertness: Awake/alert Behavior During Therapy: WFL for tasks assessed/performed Overall Cognitive Status: Within Functional Limits for tasks assessed                                     General Comments       Exercises     Shoulder Instructions      Home Living Family/patient expects to be discharged to:: Private residence Living Arrangements: Spouse/significant other Available Help at Discharge: Family;Available 24 hours/day  Type of Home: House       Home Layout: One level     Bathroom Shower/Tub: Tub/shower unit;Curtain   FirefighterBathroom Toilet: Standard     Home Equipment: Environmental consultantWalker - 2 wheels;Shower seat          Prior Functioning/Environment Level of Independence: Independent                 OT Problem List:        OT Treatment/Interventions:      OT Goals(Current goals can be found in the care plan section) Acute Rehab OT Goals Patient Stated Goal: return home OT Goal Formulation: All  assessment and education complete, DC therapy  OT Frequency:     Barriers to D/C:            Co-evaluation              AM-PAC PT "6 Clicks" Daily Activity     Outcome Measure Help from another person eating meals?: None Help from another person taking care of personal grooming?: A Little Help from another person toileting, which includes using toliet, bedpan, or urinal?: A Little Help from another person bathing (including washing, rinsing, drying)?: A Little Help from another person to put on and taking off regular upper body clothing?: None Help from another person to put on and taking off regular lower body clothing?: A Little 6 Click Score: 20   End of Session Equipment Utilized During Treatment: Rolling walker  Activity Tolerance: Patient tolerated treatment well Patient left: in chair;with call bell/phone within reach;with bed alarm set;with family/visitor present  OT Visit Diagnosis: Other abnormalities of gait and mobility (R26.89)                Time: 1610-9604: 0938-0949 OT Time Calculation (min): 11 min Charges:  OT General Charges $OT Visit: 1 Procedure OT Evaluation $OT Eval Moderate Complexity: 1 Procedure G-Codes: OT G-codes **NOT FOR INPATIENT CLASS** Functional Assessment Tool Used: Clinical judgement Functional Limitation: Self care Self Care Current Status (V4098(G8987): At least 1 percent but less than 20 percent impaired, limited or restricted Self Care Goal Status (J1914(G8988): At least 1 percent but less than 20 percent impaired, limited or restricted Self Care Discharge Status [phone](G8989): At least 1 percent but less than 20 percent impaired, limited or restricted   Fredric MareBailey A. Brett Albinooffey, M.S., OTR/L Pager: [phone]  [name] 01/25/2017, 11:12 AM

## 2017-01-25 NOTE — Progress Notes (Signed)
Physical Therapy Treatment Patient Details Name: [name] [name] MRN: [id number] DOB: 1946/03/27 Today's Date: 01/25/2017    History of Present Illness Pt is a 71 y.o. male s/p R TKA. PMHx: CKD, DM2, Tachycardia, HTN, Cervical fusion.    PT Comments    Pt very motivated and progressing well.  Reviewed home therex and stairs with pt and spouse and with written information provided.   Follow Up Recommendations  DC plan and follow up therapy as arranged by surgeon     Equipment Recommendations  None recommended by PT    Recommendations for Other Services OT consult     Precautions / Restrictions Precautions Precautions: Knee Precaution Booklet Issued: No Restrictions Weight Bearing Restrictions: No RLE Weight Bearing: Weight bearing as tolerated    Mobility  Bed Mobility Overal bed mobility: Needs Assistance Bed Mobility: Supine to Sit     Supine to sit: Supervision     General bed mobility comments: NT - pt OOB and requests back to chair  Transfers Overall transfer level: Needs assistance Equipment used: Rolling walker (2 wheeled) Transfers: Sit to/from Stand Sit to Stand: Supervision         General transfer comment: cues for LE managment and use of UEs to self assist  Ambulation/Gait Ambulation/Gait assistance: Min guard;Supervision Ambulation Distance (Feet): 200 Feet Assistive device: Rolling walker (2 wheeled) Gait Pattern/deviations: Step-to pattern;Decreased step length - right;Decreased step length - left;Shuffle;Trunk flexed Gait velocity: decr Gait velocity interpretation: Below normal speed for age/gender General Gait Details: cues for posture, position from RW and sequence   Stairs Stairs: Yes   Stair Management: No rails;Step to pattern;Backwards;With walker Number of Stairs: 4 General stair comments: 2 step twice bkwd with RW, cues for sequence and foot/RW placement, spouse assisting on second attempt and written instructions  provided  Wheelchair Mobility    Modified Rankin (Stroke Patients Only)       Balance Overall balance assessment: No apparent balance deficits (not formally assessed)                                          Cognition Arousal/Alertness: Awake/alert Behavior During Therapy: WFL for tasks assessed/performed Overall Cognitive Status: Within Functional Limits for tasks assessed                                        Exercises Total Joint Exercises Ankle Circles/Pumps: AROM;Both;20 reps;Supine Quad Sets: AROM;Both;10 reps;Supine Heel Slides: AAROM;Right;15 reps;Supine Straight Leg Raises: AROM;Right;10 reps;Supine Long Arc Quad: AROM;Right;10 reps;Seated Knee Flexion: AROM;AAROM;Right;15 reps;Seated    General Comments        Pertinent Vitals/Pain Pain Assessment: 0-10 Pain Score: 4  Pain Location: R knee Pain Descriptors / Indicators: Aching;Sore Pain Intervention(s): Limited activity within patient's tolerance;Monitored during session;Premedicated before session;Ice applied    Home Living Family/patient expects to be discharged to:: Private residence Living Arrangements: Spouse/significant other Available Help at Discharge: Family;Available 24 hours/day Type of Home: House Home Access: Stairs to enter Entrance Stairs-Rails: None Home Layout: One level Home Equipment: Environmental consultant - 2 wheels;Shower seat;Crutches      Prior Function Level of Independence: Independent          PT Goals (current goals can now be found in the care plan section) Acute Rehab PT Goals Patient Stated Goal: return home PT Goal  Formulation: With patient Time For Goal Achievement: 01/28/17 Potential to Achieve Goals: Good Progress towards PT goals: Progressing toward goals    Frequency    7X/week      PT Plan Current plan remains appropriate    Co-evaluation              AM-PAC PT "6 Clicks" Daily Activity  Outcome Measure  Difficulty  turning over in bed (including adjusting bedclothes, sheets and blankets)?: A Little Difficulty moving from lying on back to sitting on the side of the bed? : A Little Difficulty sitting down on and standing up from a chair with arms (e.g., wheelchair, bedside commode, etc,.)?: A Little Help needed moving to and from a bed to chair (including a wheelchair)?: A Little Help needed walking in hospital room?: A Little Help needed climbing 3-5 steps with a railing? : A Little 6 Click Score: 18    End of Session Equipment Utilized During Treatment: Gait belt Activity Tolerance: Patient tolerated treatment well Patient left: in chair;with call bell/phone within reach;with family/visitor present Nurse Communication: Mobility status PT Visit Diagnosis: Difficulty in walking, not elsewhere classified (R26.2)     Time: [id number] PT Time Calculation (min) (ACUTE ONLY): 49 min  Charges:  $Gait Training: 8-22 mins $Therapeutic Exercise: 8-22 mins $Therapeutic Activity: 8-22 mins                    G Codes:       Pg [phone]    Derhonda Eastlick 01/25/2017, 3:17 PM

## 2017-01-26 NOTE — Progress Notes (Signed)
   01/25/17 1200  PT Time Calculation  PT Start Time (ACUTE ONLY) 0902  PT Stop Time (ACUTE ONLY) 0934  PT Time Calculation (min) (ACUTE ONLY) 32 min  PT G-Codes **NOT FOR INPATIENT CLASS**  Functional Assessment Tool Used Clinical judgement  Functional Limitation Mobility: Walking and moving around  Mobility: Walking and Moving Around Current Status (R6045(G8978) CJ  Mobility: Walking and Moving Around Goal Status (W0981(G8979) CI  PT General Charges  $$ ACUTE PT VISIT 1 Procedure  PT Evaluation  $PT Eval Low Complexity 1 Procedure  PT Treatments  $Therapeutic Exercise 8-22 mins

## 2017-01-26 NOTE — Discharge Summary (Signed)
Physician Discharge Summary  Patient ID: [name] LoudWilliam J Leeb MRN: [id number] DOB/AGE: 1945/11/08 71 y.o.  Admit date: 01/24/2017 Discharge date: 01/25/2017   Procedures:  Procedure(s) (LRB): RIGHT TOTAL KNEE ARTHROPLASTY (Right)  Attending Physician:  Dr. Durene RomansMatthew Olin   Admission Diagnoses:   Right knee primary OA /pain  Discharge Diagnoses:  Principal Problem:   S/P right TKA Active Problems:   S/P total knee replacement  Past Medical History:  Diagnosis Date  . Allergic rhinitis   . Asthma   . Chronic kidney disease    kidney stones  . Complication of anesthesia    Difficult arousing; c/o some trouble breathing when waking up ; says "they need to sit me up that helps "  . Diabetes mellitus type II   . Diverticulosis of colon with hemorrhage 02/15/2015  . Dysrhythmia    tachycardia  . Headache   . Hypertension     HPI:    [name] [name], 71 y.o. male, has a history of pain and functional disability in the right knee due to arthritis and has failed non-surgical conservative treatments for greater than 12 weeks to include NSAID's and/or analgesics and activity modification.  Onset of symptoms was gradual, starting 20+ years ago with gradually worsening course since that time. The patient noted prior procedures on the knee to include  arthroscopy and menisectomy on the right knee(s).  Patient currently rates pain in the right knee(s) at 8 out of 10 with activity. Patient has worsening of pain with activity and weight bearing, pain that interferes with activities of daily living, pain with passive range of motion, crepitus and joint swelling.  Patient has evidence of periarticular osteophytes and joint space narrowing by imaging studies.There is no active infection.   Risks, benefits and expectations were discussed with the patient.  Risks including but not limited to the risk of anesthesia, blood clots, nerve damage, blood vessel damage, failure of the prosthesis, infection and up  to and including death.  Patient understand the risks, benefits and expectations and wishes to proceed with surgery.   PCP: Jackie Plumsei-Bonsu, George, MD   Discharged Condition: good  Hospital Course:  Patient underwent the above stated procedure on 01/24/2017. Patient tolerated the procedure well and brought to the recovery room in good condition and subsequently to the floor.  POD #1 BP: 111/57 ; Pulse: 66 ; Temp: 97.9 F (36.6 C) ; Resp: 16 Patient reports pain as mild, pain controlled.  No events throughout the night. Ready to be discharged home.  Dorsiflexion/plantar flexion intact, incision: dressing C/D/I, no cellulitis present and compartment soft.   LABS  Basename    HGB     11.0  HCT     32.3    Discharge Exam: General appearance: alert, cooperative and no distress Extremities: Homans sign is negative, no sign of DVT, no edema, redness or tenderness in the calves or thighs and no ulcers, gangrene or trophic changes  Disposition: Home with follow up in 2 weeks   Follow-up Information    Durene Romanslin, Islam Villescas, MD. Schedule an appointment as soon as possible for a visit in 2 week(s).   Specialty:  Orthopedic Surgery Contact information: [address] [place] [address] [phone]           Discharge Instructions    Call MD / Call 911    Complete by:  As directed    If you experience chest pain or shortness of breath, CALL 911 and be transported to the hospital  emergency room.  If you develope a fever above 101 F, pus (white drainage) or increased drainage or redness at the wound, or calf pain, call your surgeon's office.   Change dressing    Complete by:  As directed    Maintain surgical dressing until follow up in the clinic. If the edges start to pull up, may reinforce with tape. If the dressing is no longer working, may remove and cover with gauze and tape, but must keep the area dry and clean.  Call with any questions or concerns.   Constipation  Prevention    Complete by:  As directed    Drink plenty of fluids.  Prune juice may be helpful.  You may use a stool softener, such as Colace (over the counter) 100 mg twice a day.  Use MiraLax (over the counter) for constipation as needed.   Diet - low sodium heart healthy    Complete by:  As directed    Discharge instructions    Complete by:  As directed    Maintain surgical dressing until follow up in the clinic. If the edges start to pull up, may reinforce with tape. If the dressing is no longer working, may remove and cover with gauze and tape, but must keep the area dry and clean.  Follow up in 2 weeks at [facility]. Call with any questions or concerns.   Increase activity slowly as tolerated    Complete by:  As directed    Weight bearing as tolerated with assist device (walker, cane, etc) as directed, use it as long as suggested by your surgeon or therapist, typically at least 4-6 weeks.   TED hose    Complete by:  As directed    Use stockings (TED hose) for 2 weeks on both leg(s).  You may remove them at night for sleeping.      Allergies as of 01/25/2017      Reactions   Dilaudid [hydromorphone Hcl] Shortness Of Breath   Hydrocodone Shortness Of Breath   Oxycodone Shortness Of Breath   Tramadol Shortness Of Breath   Penicillins Other (See Comments)   Passes out Has patient had a PCN reaction causing immediate rash, facial/tongue/throat swelling, SOB or lightheadedness with hypotension: unknown Has patient had a PCN reaction causing severe rash involving mucus membranes or skin necrosis: unknown Has patient had a PCN reaction that required hospitalization no Has patient had a PCN reaction occurring within the last 10 years: no If all of the above answers are "NO", then may proceed with Cephalosporin use.      Medication List    STOP taking these medications   aspirin 325 MG tablet Replaced by:  aspirin 81 MG chewable tablet     TAKE these medications     acetaminophen 500 MG tablet Commonly known as:  TYLENOL Take 2 tablets (1,000 mg total) by mouth every 8 (eight) hours. What changed:  how much to take  when to take this  reasons to take this   albuterol (2.5 MG/3ML) 0.083% nebulizer solution Commonly known as:  PROVENTIL Take 2.5 mg by nebulization daily as needed for wheezing or shortness of breath.   aspirin 81 MG chewable tablet Chew 1 tablet (81 mg total) by mouth 2 (two) times daily. Take for 4 weeks, then resume regular dose. Replaces:  aspirin 325 MG tablet   Biotin 45409 MCG Tabs Take 10,000 mcg by mouth daily.   celecoxib 200 MG capsule Commonly known as:  CELEBREX Take  1 capsule (200 mg total) by mouth every 12 (twelve) hours.   COMBIVENT RESPIMAT 20-100 MCG/ACT Aers respimat Generic drug:  Ipratropium-Albuterol Inhale 1 puff into the lungs daily.   COPPER PO Take 2.5 mg by mouth daily.   docusate sodium 100 MG capsule Commonly known as:  COLACE Take 1 capsule (100 mg total) by mouth 2 (two) times daily.   ferrous sulfate 325 (65 FE) MG tablet Take 1 tablet (325 mg total) by mouth 3 (three) times daily after meals.   losartan 50 MG tablet Commonly known as:  COZAAR Take 50 mg by mouth every Tuesday, Thursday, Saturday, and Sunday. In the morning.   metFORMIN 500 MG tablet Commonly known as:  GLUCOPHAGE Take 500 mg by mouth 2 (two) times daily.   methocarbamol 500 MG tablet Commonly known as:  ROBAXIN Take 1 tablet (500 mg total) by mouth every 6 (six) hours as needed for muscle spasms.   metoprolol succinate 25 MG 24 hr tablet Commonly known as:  TOPROL-XL Take 25 mg by mouth every Monday, Wednesday, and Friday. In the morning.   polyethylene glycol packet Commonly known as:  MIRALAX / GLYCOLAX Take 17 g by mouth 2 (two) times daily.   predniSONE 20 MG tablet Commonly known as:  DELTASONE Take 20 mg by mouth daily as needed (difficulty breathing).   UROCIT-K 15 15 MEQ (1620 MG)  Tbcr Generic drug:  Potassium Citrate Take 1 tablet by mouth 2 (two) times daily.        Signed: Anastasio Auerbach. Erik Burkett   PA-C  01/26/2017, 7:07 PM

## 2017-02-09 ENCOUNTER — Other Ambulatory Visit: Payer: Self-pay

## 2017-02-09 ENCOUNTER — Ambulatory Visit (HOSPITAL_COMMUNITY)
Admission: RE | Admit: 2017-02-09 | Discharge: 2017-02-09 | Disposition: A | Discharge status: Home / Self Care | Payer: Medicare HMO | Source: Ambulatory Visit | Attending: Vascular Surgery | Admitting: Vascular Surgery

## 2017-02-09 DIAGNOSIS — M79604 Pain in right leg: Secondary | ICD-10-CM

## 2017-02-09 DIAGNOSIS — M7989 Other specified soft tissue disorders: Secondary | ICD-10-CM

## 2017-02-09 DIAGNOSIS — I872 Venous insufficiency (chronic) (peripheral): Secondary | ICD-10-CM | POA: Insufficient documentation

## 2017-02-09 DIAGNOSIS — Z86718 Personal history of other venous thrombosis and embolism: Secondary | ICD-10-CM | POA: Insufficient documentation

## 2017-03-02 DIAGNOSIS — Z96651 Presence of right artificial knee joint: Secondary | ICD-10-CM | POA: Diagnosis not present

## 2017-03-02 DIAGNOSIS — Z471 Aftercare following joint replacement surgery: Secondary | ICD-10-CM | POA: Diagnosis not present

## 2017-03-03 DIAGNOSIS — E1165 Type 2 diabetes mellitus with hyperglycemia: Secondary | ICD-10-CM | POA: Diagnosis not present

## 2017-03-03 DIAGNOSIS — J4530 Mild persistent asthma, uncomplicated: Secondary | ICD-10-CM | POA: Diagnosis not present

## 2017-03-03 DIAGNOSIS — Z87891 Personal history of nicotine dependence: Secondary | ICD-10-CM | POA: Diagnosis not present

## 2017-03-03 DIAGNOSIS — I10 Essential (primary) hypertension: Secondary | ICD-10-CM | POA: Diagnosis not present

## 2017-03-03 DIAGNOSIS — I119 Hypertensive heart disease without heart failure: Secondary | ICD-10-CM | POA: Diagnosis not present

## 2017-03-06 DIAGNOSIS — R69 Illness, unspecified: Secondary | ICD-10-CM | POA: Diagnosis not present

## 2017-03-21 DIAGNOSIS — R69 Illness, unspecified: Secondary | ICD-10-CM | POA: Diagnosis not present

## 2017-03-23 DIAGNOSIS — Z96651 Presence of right artificial knee joint: Secondary | ICD-10-CM | POA: Diagnosis not present

## 2017-03-23 DIAGNOSIS — G8918 Other acute postprocedural pain: Secondary | ICD-10-CM | POA: Diagnosis not present

## 2017-03-23 DIAGNOSIS — M24661 Ankylosis, right knee: Secondary | ICD-10-CM | POA: Diagnosis not present

## 2017-05-10 DIAGNOSIS — I10 Essential (primary) hypertension: Secondary | ICD-10-CM | POA: Diagnosis not present

## 2017-05-10 DIAGNOSIS — I119 Hypertensive heart disease without heart failure: Secondary | ICD-10-CM | POA: Diagnosis not present

## 2017-05-10 DIAGNOSIS — Z87891 Personal history of nicotine dependence: Secondary | ICD-10-CM | POA: Diagnosis not present

## 2017-05-10 DIAGNOSIS — Z23 Encounter for immunization: Secondary | ICD-10-CM | POA: Diagnosis not present

## 2017-05-10 DIAGNOSIS — J4530 Mild persistent asthma, uncomplicated: Secondary | ICD-10-CM | POA: Diagnosis not present

## 2017-05-10 DIAGNOSIS — E1165 Type 2 diabetes mellitus with hyperglycemia: Secondary | ICD-10-CM | POA: Diagnosis not present

## 2017-05-11 DIAGNOSIS — Z96651 Presence of right artificial knee joint: Secondary | ICD-10-CM | POA: Diagnosis not present

## 2017-05-11 DIAGNOSIS — Z471 Aftercare following joint replacement surgery: Secondary | ICD-10-CM | POA: Diagnosis not present

## 2017-06-17 DIAGNOSIS — R69 Illness, unspecified: Secondary | ICD-10-CM | POA: Diagnosis not present

## 2017-07-22 DIAGNOSIS — R69 Illness, unspecified: Secondary | ICD-10-CM | POA: Diagnosis not present

## 2017-07-27 DIAGNOSIS — R69 Illness, unspecified: Secondary | ICD-10-CM | POA: Diagnosis not present

## 2017-08-24 DIAGNOSIS — Z0000 Encounter for general adult medical examination without abnormal findings: Secondary | ICD-10-CM | POA: Diagnosis not present

## 2017-08-24 DIAGNOSIS — Z125 Encounter for screening for malignant neoplasm of prostate: Secondary | ICD-10-CM | POA: Diagnosis not present

## 2017-08-24 DIAGNOSIS — J4530 Mild persistent asthma, uncomplicated: Secondary | ICD-10-CM | POA: Diagnosis not present

## 2017-08-24 DIAGNOSIS — E1165 Type 2 diabetes mellitus with hyperglycemia: Secondary | ICD-10-CM | POA: Diagnosis not present

## 2017-08-24 DIAGNOSIS — I10 Essential (primary) hypertension: Secondary | ICD-10-CM | POA: Diagnosis not present

## 2017-08-24 DIAGNOSIS — J449 Chronic obstructive pulmonary disease, unspecified: Secondary | ICD-10-CM | POA: Diagnosis not present

## 2017-08-24 DIAGNOSIS — Z87891 Personal history of nicotine dependence: Secondary | ICD-10-CM | POA: Diagnosis not present

## 2017-08-24 DIAGNOSIS — I119 Hypertensive heart disease without heart failure: Secondary | ICD-10-CM | POA: Diagnosis not present

## 2017-08-25 DIAGNOSIS — R69 Illness, unspecified: Secondary | ICD-10-CM | POA: Diagnosis not present

## 2017-09-21 DIAGNOSIS — R69 Illness, unspecified: Secondary | ICD-10-CM | POA: Diagnosis not present

## 2017-11-22 DIAGNOSIS — R69 Illness, unspecified: Secondary | ICD-10-CM | POA: Diagnosis not present

## 2017-12-06 DIAGNOSIS — E119 Type 2 diabetes mellitus without complications: Secondary | ICD-10-CM | POA: Diagnosis not present

## 2017-12-06 DIAGNOSIS — H35372 Puckering of macula, left eye: Secondary | ICD-10-CM | POA: Diagnosis not present

## 2017-12-06 DIAGNOSIS — H35342 Macular cyst, hole, or pseudohole, left eye: Secondary | ICD-10-CM | POA: Diagnosis not present

## 2017-12-08 DIAGNOSIS — H35372 Puckering of macula, left eye: Secondary | ICD-10-CM | POA: Diagnosis not present

## 2017-12-08 DIAGNOSIS — H26491 Other secondary cataract, right eye: Secondary | ICD-10-CM | POA: Diagnosis not present

## 2017-12-08 DIAGNOSIS — H43813 Vitreous degeneration, bilateral: Secondary | ICD-10-CM | POA: Diagnosis not present

## 2017-12-15 ENCOUNTER — Other Ambulatory Visit: Payer: Self-pay

## 2017-12-15 ENCOUNTER — Inpatient Hospital Stay (HOSPITAL_COMMUNITY)
Admission: EM | Admit: 2017-12-15 | Discharge: 2017-12-20 | DRG: 492 | Disposition: A | Discharge status: Rehabilitation Facility | Payer: Medicare HMO | Attending: Surgery | Admitting: Surgery

## 2017-12-15 ENCOUNTER — Inpatient Hospital Stay (HOSPITAL_COMMUNITY): Payer: Medicare HMO

## 2017-12-15 ENCOUNTER — Encounter (HOSPITAL_COMMUNITY): Payer: Self-pay | Admitting: Emergency Medicine

## 2017-12-15 ENCOUNTER — Emergency Department (HOSPITAL_COMMUNITY): Payer: Medicare HMO

## 2017-12-15 DIAGNOSIS — S50812A Abrasion of left forearm, initial encounter: Secondary | ICD-10-CM | POA: Diagnosis present

## 2017-12-15 DIAGNOSIS — S12201A Unspecified nondisplaced fracture of third cervical vertebra, initial encounter for closed fracture: Secondary | ICD-10-CM

## 2017-12-15 DIAGNOSIS — J449 Chronic obstructive pulmonary disease, unspecified: Secondary | ICD-10-CM

## 2017-12-15 DIAGNOSIS — S069X9A Unspecified intracranial injury with loss of consciousness of unspecified duration, initial encounter: Secondary | ICD-10-CM | POA: Diagnosis not present

## 2017-12-15 DIAGNOSIS — K5901 Slow transit constipation: Secondary | ICD-10-CM | POA: Diagnosis not present

## 2017-12-15 DIAGNOSIS — Z79899 Other long term (current) drug therapy: Secondary | ICD-10-CM | POA: Diagnosis not present

## 2017-12-15 DIAGNOSIS — S12101A Unspecified nondisplaced fracture of second cervical vertebra, initial encounter for closed fracture: Secondary | ICD-10-CM

## 2017-12-15 DIAGNOSIS — L4050 Arthropathic psoriasis, unspecified: Secondary | ICD-10-CM | POA: Diagnosis not present

## 2017-12-15 DIAGNOSIS — Z981 Arthrodesis status: Secondary | ICD-10-CM

## 2017-12-15 DIAGNOSIS — S12190A Other displaced fracture of second cervical vertebra, initial encounter for closed fracture: Secondary | ICD-10-CM | POA: Diagnosis not present

## 2017-12-15 DIAGNOSIS — Z880 Allergy status to penicillin: Secondary | ICD-10-CM

## 2017-12-15 DIAGNOSIS — H538 Other visual disturbances: Secondary | ICD-10-CM | POA: Diagnosis present

## 2017-12-15 DIAGNOSIS — S3981XA Other specified injuries of abdomen, initial encounter: Secondary | ICD-10-CM | POA: Diagnosis not present

## 2017-12-15 DIAGNOSIS — M25511 Pain in right shoulder: Secondary | ICD-10-CM | POA: Diagnosis not present

## 2017-12-15 DIAGNOSIS — G8918 Other acute postprocedural pain: Secondary | ICD-10-CM | POA: Diagnosis not present

## 2017-12-15 DIAGNOSIS — S0101XA Laceration without foreign body of scalp, initial encounter: Secondary | ICD-10-CM | POA: Diagnosis present

## 2017-12-15 DIAGNOSIS — G4733 Obstructive sleep apnea (adult) (pediatric): Secondary | ICD-10-CM | POA: Diagnosis present

## 2017-12-15 DIAGNOSIS — I609 Nontraumatic subarachnoid hemorrhage, unspecified: Secondary | ICD-10-CM | POA: Diagnosis not present

## 2017-12-15 DIAGNOSIS — R0682 Tachypnea, not elsewhere classified: Secondary | ICD-10-CM

## 2017-12-15 DIAGNOSIS — Z96651 Presence of right artificial knee joint: Secondary | ICD-10-CM | POA: Diagnosis present

## 2017-12-15 DIAGNOSIS — S12301A Unspecified nondisplaced fracture of fourth cervical vertebra, initial encounter for closed fracture: Secondary | ICD-10-CM

## 2017-12-15 DIAGNOSIS — D62 Acute posthemorrhagic anemia: Secondary | ICD-10-CM | POA: Diagnosis not present

## 2017-12-15 DIAGNOSIS — T148XXA Other injury of unspecified body region, initial encounter: Secondary | ICD-10-CM | POA: Diagnosis not present

## 2017-12-15 DIAGNOSIS — Z885 Allergy status to narcotic agent status: Secondary | ICD-10-CM | POA: Diagnosis not present

## 2017-12-15 DIAGNOSIS — S12290A Other displaced fracture of third cervical vertebra, initial encounter for closed fracture: Secondary | ICD-10-CM | POA: Diagnosis not present

## 2017-12-15 DIAGNOSIS — Y92410 Unspecified street and highway as the place of occurrence of the external cause: Secondary | ICD-10-CM

## 2017-12-15 DIAGNOSIS — E46 Unspecified protein-calorie malnutrition: Secondary | ICD-10-CM | POA: Diagnosis not present

## 2017-12-15 DIAGNOSIS — E119 Type 2 diabetes mellitus without complications: Secondary | ICD-10-CM | POA: Diagnosis not present

## 2017-12-15 DIAGNOSIS — S299XXA Unspecified injury of thorax, initial encounter: Secondary | ICD-10-CM | POA: Diagnosis not present

## 2017-12-15 DIAGNOSIS — S42201A Unspecified fracture of upper end of right humerus, initial encounter for closed fracture: Secondary | ICD-10-CM | POA: Diagnosis not present

## 2017-12-15 DIAGNOSIS — S3991XA Unspecified injury of abdomen, initial encounter: Secondary | ICD-10-CM | POA: Diagnosis not present

## 2017-12-15 DIAGNOSIS — S42291A Other displaced fracture of upper end of right humerus, initial encounter for closed fracture: Principal | ICD-10-CM | POA: Diagnosis present

## 2017-12-15 DIAGNOSIS — R079 Chest pain, unspecified: Secondary | ICD-10-CM | POA: Diagnosis not present

## 2017-12-15 DIAGNOSIS — S6992XA Unspecified injury of left wrist, hand and finger(s), initial encounter: Secondary | ICD-10-CM | POA: Diagnosis not present

## 2017-12-15 DIAGNOSIS — M25532 Pain in left wrist: Secondary | ICD-10-CM | POA: Diagnosis not present

## 2017-12-15 DIAGNOSIS — S3993XA Unspecified injury of pelvis, initial encounter: Secondary | ICD-10-CM | POA: Diagnosis not present

## 2017-12-15 DIAGNOSIS — Z419 Encounter for procedure for purposes other than remedying health state, unspecified: Secondary | ICD-10-CM

## 2017-12-15 DIAGNOSIS — T1490XA Injury, unspecified, initial encounter: Secondary | ICD-10-CM | POA: Diagnosis not present

## 2017-12-15 DIAGNOSIS — D696 Thrombocytopenia, unspecified: Secondary | ICD-10-CM

## 2017-12-15 DIAGNOSIS — T07XXXA Unspecified multiple injuries, initial encounter: Secondary | ICD-10-CM | POA: Diagnosis not present

## 2017-12-15 DIAGNOSIS — S065X9A Traumatic subdural hemorrhage with loss of consciousness of unspecified duration, initial encounter: Secondary | ICD-10-CM | POA: Diagnosis present

## 2017-12-15 DIAGNOSIS — Z298 Encounter for other specified prophylactic measures: Secondary | ICD-10-CM | POA: Diagnosis not present

## 2017-12-15 DIAGNOSIS — S50811A Abrasion of right forearm, initial encounter: Secondary | ICD-10-CM | POA: Diagnosis present

## 2017-12-15 DIAGNOSIS — S066X9A Traumatic subarachnoid hemorrhage with loss of consciousness of unspecified duration, initial encounter: Secondary | ICD-10-CM | POA: Diagnosis not present

## 2017-12-15 DIAGNOSIS — S065X0A Traumatic subdural hemorrhage without loss of consciousness, initial encounter: Secondary | ICD-10-CM | POA: Diagnosis not present

## 2017-12-15 DIAGNOSIS — M25519 Pain in unspecified shoulder: Secondary | ICD-10-CM | POA: Diagnosis not present

## 2017-12-15 DIAGNOSIS — S066X0A Traumatic subarachnoid hemorrhage without loss of consciousness, initial encounter: Secondary | ICD-10-CM | POA: Diagnosis not present

## 2017-12-15 DIAGNOSIS — S42296S Other nondisplaced fracture of upper end of unspecified humerus, sequela: Secondary | ICD-10-CM | POA: Diagnosis not present

## 2017-12-15 DIAGNOSIS — S0181XA Laceration without foreign body of other part of head, initial encounter: Secondary | ICD-10-CM | POA: Diagnosis not present

## 2017-12-15 DIAGNOSIS — Z7982 Long term (current) use of aspirin: Secondary | ICD-10-CM

## 2017-12-15 DIAGNOSIS — S199XXA Unspecified injury of neck, initial encounter: Secondary | ICD-10-CM | POA: Diagnosis not present

## 2017-12-15 DIAGNOSIS — M542 Cervicalgia: Secondary | ICD-10-CM | POA: Diagnosis not present

## 2017-12-15 DIAGNOSIS — I10 Essential (primary) hypertension: Secondary | ICD-10-CM

## 2017-12-15 DIAGNOSIS — Z7984 Long term (current) use of oral hypoglycemic drugs: Secondary | ICD-10-CM | POA: Diagnosis not present

## 2017-12-15 DIAGNOSIS — S069X9S Unspecified intracranial injury with loss of consciousness of unspecified duration, sequela: Secondary | ICD-10-CM | POA: Diagnosis not present

## 2017-12-15 DIAGNOSIS — S42301A Unspecified fracture of shaft of humerus, right arm, initial encounter for closed fracture: Secondary | ICD-10-CM | POA: Diagnosis not present

## 2017-12-15 DIAGNOSIS — S065XAA Traumatic subdural hemorrhage with loss of consciousness status unknown, initial encounter: Secondary | ICD-10-CM

## 2017-12-15 DIAGNOSIS — I959 Hypotension, unspecified: Secondary | ICD-10-CM | POA: Diagnosis not present

## 2017-12-15 DIAGNOSIS — M79603 Pain in arm, unspecified: Secondary | ICD-10-CM | POA: Diagnosis not present

## 2017-12-15 DIAGNOSIS — S0990XA Unspecified injury of head, initial encounter: Secondary | ICD-10-CM | POA: Diagnosis not present

## 2017-12-15 DIAGNOSIS — R0781 Pleurodynia: Secondary | ICD-10-CM | POA: Diagnosis not present

## 2017-12-15 HISTORY — DX: Acute embolism and thrombosis of unspecified deep veins of unspecified lower extremity: I82.409

## 2017-12-15 HISTORY — DX: Type 2 diabetes mellitus without complications: E11.9

## 2017-12-15 HISTORY — DX: Chronic obstructive pulmonary disease, unspecified: J44.9

## 2017-12-15 HISTORY — DX: Sleep apnea, unspecified: G47.30

## 2017-12-15 HISTORY — DX: Unspecified asthma, uncomplicated: J45.909

## 2017-12-15 LAB — I-STAT CHEM 8, ED
BUN: 18 mg/dL (ref 6–20)
CALCIUM ION: 1.11 mmol/L — AB (ref 1.15–1.40)
CHLORIDE: 104 mmol/L (ref 101–111)
CREATININE: 1.2 mg/dL (ref 0.61–1.24)
GLUCOSE: 133 mg/dL — AB (ref 65–99)
HCT: 36 % — ABNORMAL LOW (ref 39.0–52.0)
Hemoglobin: 12.2 g/dL — ABNORMAL LOW (ref 13.0–17.0)
POTASSIUM: 4.4 mmol/L (ref 3.5–5.1)
Sodium: 142 mmol/L (ref 135–145)
TCO2: 22 mmol/L (ref 22–32)

## 2017-12-15 LAB — CBC
HCT: 39.6 % (ref 39.0–52.0)
HEMOGLOBIN: 12.9 g/dL — AB (ref 13.0–17.0)
MCH: 28.5 pg (ref 26.0–34.0)
MCHC: 32.6 g/dL (ref 30.0–36.0)
MCV: 87.6 fL (ref 78.0–100.0)
Platelets: 168 10*3/uL (ref 150–400)
RBC: 4.52 MIL/uL (ref 4.22–5.81)
RDW: 13.8 % (ref 11.5–15.5)
WBC: 6.9 10*3/uL (ref 4.0–10.5)

## 2017-12-15 LAB — URINALYSIS, ROUTINE W REFLEX MICROSCOPIC
Bilirubin Urine: NEGATIVE
GLUCOSE, UA: NEGATIVE mg/dL
KETONES UR: NEGATIVE mg/dL
LEUKOCYTES UA: NEGATIVE
Nitrite: NEGATIVE
Protein, ur: 30 mg/dL — AB
Specific Gravity, Urine: >1.046 — ABNORMAL HIGH (ref 1.005–1.030)
pH: 8 (ref 5.0–8.0)

## 2017-12-15 LAB — MRSA PCR SCREENING: MRSA BY PCR: NEGATIVE

## 2017-12-15 LAB — BPAM FFP
BLOOD PRODUCT EXPIRATION DATE: 201906262359
BLOOD PRODUCT EXPIRATION DATE: 201906262359
ISSUE DATE / TIME: 201906071238
ISSUE DATE / TIME: 201906071238
UNIT TYPE AND RH: 6200
UNIT TYPE AND RH: 6200

## 2017-12-15 LAB — COMPREHENSIVE METABOLIC PANEL
ALK PHOS: 52 U/L (ref 38–126)
ALT: 43 U/L (ref 17–63)
ANION GAP: 11 (ref 5–15)
AST: 78 U/L — ABNORMAL HIGH (ref 15–41)
Albumin: 3.4 g/dL — ABNORMAL LOW (ref 3.5–5.0)
BILIRUBIN TOTAL: 0.6 mg/dL (ref 0.3–1.2)
BUN: 17 mg/dL (ref 6–20)
CALCIUM: 8.5 mg/dL — AB (ref 8.9–10.3)
CO2: 23 mmol/L (ref 22–32)
CREATININE: 1.22 mg/dL (ref 0.61–1.24)
Chloride: 107 mmol/L (ref 101–111)
GFR, EST NON AFRICAN AMERICAN: 59 mL/min — AB (ref >60)
Glucose, Bld: 136 mg/dL — ABNORMAL HIGH (ref 65–99)
Potassium: 4.5 mmol/L (ref 3.5–5.1)
SODIUM: 141 mmol/L (ref 135–145)
TOTAL PROTEIN: 5.2 g/dL — AB (ref 6.5–8.1)

## 2017-12-15 LAB — CBG MONITORING, ED
GLUCOSE-CAPILLARY: 144 mg/dL — AB (ref 65–99)
Glucose-Capillary: 130 mg/dL — ABNORMAL HIGH (ref 65–99)

## 2017-12-15 LAB — PROTIME-INR
INR: 1.17
PROTHROMBIN TIME: 14.9 s (ref 11.4–15.2)

## 2017-12-15 LAB — GLUCOSE, CAPILLARY
GLUCOSE-CAPILLARY: 150 mg/dL — AB (ref 65–99)
Glucose-Capillary: 138 mg/dL — ABNORMAL HIGH (ref 65–99)

## 2017-12-15 LAB — PREPARE FRESH FROZEN PLASMA
UNIT DIVISION: 0
Unit division: 0

## 2017-12-15 LAB — ABO/RH: ABO/RH(D): A POS

## 2017-12-15 LAB — HEMOGLOBIN AND HEMATOCRIT, BLOOD
HCT: 31.7 % — ABNORMAL LOW (ref 39.0–52.0)
HEMATOCRIT: 36.2 % — AB (ref 39.0–52.0)
HEMOGLOBIN: 11.8 g/dL — AB (ref 13.0–17.0)
Hemoglobin: 10.4 g/dL — ABNORMAL LOW (ref 13.0–17.0)

## 2017-12-15 LAB — CDS SEROLOGY

## 2017-12-15 LAB — ETHANOL

## 2017-12-15 LAB — I-STAT CG4 LACTIC ACID, ED: Lactic Acid, Venous: 3.33 mmol/L (ref 0.5–1.9)

## 2017-12-15 MED ORDER — IOHEXOL 300 MG/ML  SOLN
100.0000 mL | Freq: Once | INTRAMUSCULAR | Status: AC | PRN
  Administered 2017-12-15: 100 mL via INTRAVENOUS

## 2017-12-15 MED ORDER — LOSARTAN POTASSIUM 50 MG PO TABS
50.0000 mg | ORAL_TABLET | ORAL | Status: DC
  Administered 2017-12-16 – 2017-12-19 (×3): 50 mg via ORAL
  Filled 2017-12-15 (×3): qty 1

## 2017-12-15 MED ORDER — METOPROLOL SUCCINATE ER 25 MG PO TB24
25.0000 mg | ORAL_TABLET | ORAL | Status: DC
  Administered 2017-12-18 – 2017-12-20 (×2): 25 mg via ORAL
  Filled 2017-12-15 (×2): qty 1

## 2017-12-15 MED ORDER — METHOCARBAMOL 1000 MG/10ML IJ SOLN
500.0000 mg | Freq: Three times a day (TID) | INTRAMUSCULAR | Status: DC | PRN
  Administered 2017-12-15 – 2017-12-17 (×4): 500 mg via INTRAVENOUS
  Filled 2017-12-15 (×5): qty 5
  Filled 2017-12-15: qty 550

## 2017-12-15 MED ORDER — METOPROLOL TARTRATE 5 MG/5ML IV SOLN
5.0000 mg | Freq: Four times a day (QID) | INTRAVENOUS | Status: DC | PRN
Start: 2017-12-15 — End: 2017-12-20

## 2017-12-15 MED ORDER — ONDANSETRON 4 MG PO TBDP: 4.0000 mg | ORAL_TABLET | Freq: Four times a day (QID) | ORAL | Status: DC | PRN

## 2017-12-15 MED ORDER — ONDANSETRON HCL 4 MG/2ML IJ SOLN
4.0000 mg | Freq: Four times a day (QID) | INTRAMUSCULAR | Status: DC | PRN
  Administered 2017-12-15: 4 mg via INTRAVENOUS
  Filled 2017-12-15: qty 2

## 2017-12-15 MED ORDER — ACETAMINOPHEN 500 MG PO TABS
1000.0000 mg | ORAL_TABLET | Freq: Four times a day (QID) | ORAL | Status: DC | PRN
  Administered 2017-12-15 – 2017-12-19 (×13): 1000 mg via ORAL
  Filled 2017-12-15 (×13): qty 2

## 2017-12-15 MED ORDER — ALBUTEROL SULFATE (2.5 MG/3ML) 0.083% IN NEBU
2.5000 mg | INHALATION_SOLUTION | RESPIRATORY_TRACT | Status: DC | PRN
  Administered 2017-12-20: 2.5 mg via RESPIRATORY_TRACT
  Filled 2017-12-15: qty 3

## 2017-12-15 MED ORDER — IOPAMIDOL (ISOVUE-300) INJECTION 61%: 100.0000 mL | Freq: Once | INTRAVENOUS | Status: DC | PRN

## 2017-12-15 MED ORDER — SODIUM CHLORIDE 0.9 % IV SOLN
INTRAVENOUS | Status: DC
  Administered 2017-12-15 – 2017-12-16 (×3): via INTRAVENOUS
  Administered 2017-12-16: 100 mL/h via INTRAVENOUS
  Administered 2017-12-17 (×2): via INTRAVENOUS

## 2017-12-15 MED ORDER — LEVETIRACETAM 500 MG PO TABS
500.0000 mg | ORAL_TABLET | Freq: Two times a day (BID) | ORAL | Status: DC
  Administered 2017-12-15 – 2017-12-20 (×10): 500 mg via ORAL
  Filled 2017-12-15 (×10): qty 1

## 2017-12-15 MED ORDER — PANTOPRAZOLE SODIUM 40 MG PO TBEC
40.0000 mg | DELAYED_RELEASE_TABLET | ORAL | Status: DC
  Administered 2017-12-17 – 2017-12-20 (×4): 40 mg via ORAL
  Filled 2017-12-15 (×6): qty 1

## 2017-12-15 MED ORDER — INSULIN ASPART 100 UNIT/ML ~~LOC~~ SOLN
0.0000 [IU] | Freq: Three times a day (TID) | SUBCUTANEOUS | Status: DC
  Administered 2017-12-16 (×2): 1 [IU] via SUBCUTANEOUS

## 2017-12-15 MED ORDER — FAMOTIDINE IN NACL 20-0.9 MG/50ML-% IV SOLN
20.0000 mg | INTRAVENOUS | Status: DC
  Administered 2017-12-15 – 2017-12-16 (×2): 20 mg via INTRAVENOUS
  Filled 2017-12-15 (×2): qty 50

## 2017-12-15 MED ORDER — IPRATROPIUM-ALBUTEROL 0.5-2.5 (3) MG/3ML IN SOLN
3.0000 mL | Freq: Two times a day (BID) | RESPIRATORY_TRACT | Status: DC
  Administered 2017-12-15 – 2017-12-16 (×3): 3 mL via RESPIRATORY_TRACT
  Filled 2017-12-15 (×3): qty 3

## 2017-12-15 NOTE — ED Notes (Signed)
Valuables from security given back to the pt's wife.

## 2017-12-15 NOTE — ED Notes (Signed)
The pt wedding rings were given to the wife. The pt wallet remains locked up with security.
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
## 2017-12-15 NOTE — ED Notes (Signed)
Wound care was provided to the left forearm, right forearm, head and dried blood was cleaned from the neck and chest.
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
## 2017-12-15 NOTE — ED Notes (Signed)
Aspen collar placed on the pt maintaining support of C spine. Patient transported to X-ray afterward.

## 2017-12-15 NOTE — ED Notes (Signed)
[facility]. Ortho informed that CT is able to result 3D shoulder with prior scans.

## 2017-12-15 NOTE — ED Provider Notes (Signed)
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 administration in time range)  losartan (COZAAR) tablet 50 mg (has no administration in time range)  metoprolol succinate (TOPROL-XL) 24 hr tablet 25 mg (has no administration in time range)  levETIRAcetam (KEPPRA) tablet 500 mg (has no administration in time range)  iohexol (OMNIPAQUE) 300 MG/ML solution 100 mL (100 mLs Intravenous Contrast Given 12/15/17 1313)     Initial Impression / Assessment and Plan / ED Course  I have reviewed the triage vital signs and the nursing notes.  Pertinent labs & imaging results that were available during my care of the patient were reviewed by me and considered in my medical decision making (see chart for details).     Initial hypotension resolved on arrival to emergency department.  He received about 200 cc of fluid.  Chest and abdomen are without significant findings.  FAST exam demonstrates no free fluid.  Obvious concussion on arrival.  Patient will undergo trauma imaging.  He has obvious deformity and  orthopedic injury of his right shoulder.  CT head demonstrates left-sided subdural hematoma as well as subarachnoid hemorrhage.  C2, C3, C4 facet fractures without significant displacement.  No weakness in his arms or legs to suggest neuro deficit at this time.  Patient placed in Aspen collar.  Proximal right humerus fracture will be treated with a sling and orthopedic consultation.  No chest or abdominal or pelvic trauma found.  Patient and family updated.  Mental status improving here in the emergency department.  Patient will be admitted to the trauma service.  Consultations: Trauma-Dr. Magnus IvanBlackman Neurosurgery-Vincent Costello, PA Orthopedic, Earney HamburgMichael Jeffrey, PA, Dr. Roda ShuttersXu  Patient be admitted to hospital.   Final Clinical Impressions(s) / ED Diagnoses   Final diagnoses:  SAH (subarachnoid hemorrhage) (HCC)  Subdural hematoma (HCC)  Closed nondisplaced fracture of second cervical vertebra, unspecified fracture morphology, initial encounter (HCC)  Closed nondisplaced fracture of third cervical vertebra, unspecified fracture morphology, initial encounter (HCC)  Closed nondisplaced fracture of fourth cervical vertebra, unspecified fracture morphology, initial encounter (HCC)  Closed fracture of proximal end of right humerus, unspecified fracture morphology, initial encounter    ED Discharge Orders    None       Azalia Bilisampos, Corabelle Spackman, MD 12/15/17 1709
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
## 2017-12-15 NOTE — Progress Notes (Signed)
Orthopedic Tech Progress Note Patient Details:  [name] [name] 1946/05/30 [id number]  Ortho Devices Type of Ortho Device: Shoulder immobilizer Ortho Device/Splint Interventions: Application   Post Interventions Patient Tolerated: Well Instructions Provided: Care of device   Saul FordyceJennifer C Slyvester Latona 12/15/2017, 5:27 PM
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
## 2017-12-16 ENCOUNTER — Inpatient Hospital Stay (HOSPITAL_COMMUNITY): Payer: Medicare HMO

## 2017-12-16 ENCOUNTER — Encounter (HOSPITAL_COMMUNITY): Payer: Self-pay

## 2017-12-16 DIAGNOSIS — S42201A Unspecified fracture of upper end of right humerus, initial encounter for closed fracture: Secondary | ICD-10-CM

## 2017-12-16 LAB — GLUCOSE, CAPILLARY
GLUCOSE-CAPILLARY: 129 mg/dL — AB (ref 65–99)
GLUCOSE-CAPILLARY: 122 mg/dL — AB (ref 65–99)
GLUCOSE-CAPILLARY: 121 mg/dL — AB (ref 65–99)
GLUCOSE-CAPILLARY: 117 mg/dL — AB (ref 65–99)
GLUCOSE-CAPILLARY: 120 mg/dL — AB (ref 65–99)

## 2017-12-16 LAB — HIV ANTIBODY (ROUTINE TESTING W REFLEX): HIV SCREEN 4TH GENERATION: NONREACTIVE

## 2017-12-16 LAB — CBC
HCT: 31.6 % — ABNORMAL LOW (ref 39.0–52.0)
HEMOGLOBIN: 10.4 g/dL — AB (ref 13.0–17.0)
MCH: 28.8 pg (ref 26.0–34.0)
MCHC: 32.9 g/dL (ref 30.0–36.0)
MCV: 87.5 fL (ref 78.0–100.0)
PLATELETS: UNDETERMINED 10*3/uL (ref 150–400)
RBC: 3.61 MIL/uL — AB (ref 4.22–5.81)
RDW: 13.9 % (ref 11.5–15.5)
WBC: 8.9 10*3/uL (ref 4.0–10.5)

## 2017-12-16 LAB — BLOOD PRODUCT ORDER (VERBAL) VERIFICATION

## 2017-12-16 LAB — BASIC METABOLIC PANEL
Anion gap: 6 (ref 5–15)
BUN: 19 mg/dL (ref 6–20)
CHLORIDE: 111 mmol/L (ref 101–111)
CO2: 23 mmol/L (ref 22–32)
CREATININE: 1.3 mg/dL — AB (ref 0.61–1.24)
Calcium: 7.8 mg/dL — ABNORMAL LOW (ref 8.9–10.3)
GFR calc non Af Amer: 54 mL/min — ABNORMAL LOW (ref >60)
Glucose, Bld: 133 mg/dL — ABNORMAL HIGH (ref 65–99)
Potassium: 4.4 mmol/L (ref 3.5–5.1)
SODIUM: 140 mmol/L (ref 135–145)

## 2017-12-16 LAB — SURGICAL PCR SCREEN
MRSA, PCR: NEGATIVE
STAPHYLOCOCCUS AUREUS: NEGATIVE
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
## 2017-12-16 NOTE — Progress Notes (Signed)
Rehab Admissions Coordinator Note:  Patient was screened by Clois DupesBoyette, Maclovia Uher Godwin for appropriateness for an Inpatient Acute Rehab Consult per PT recommendation.  At this time, we are recommending an inpt rehab consult if pt would like to be considered for admit.Clois Dupes.  Clearnce Leja Godwin 12/16/2017, 4:45 PM  I can be reached at [phone].
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
## 2017-12-16 NOTE — Consult Note (Signed)
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 exception of those mentioned in the HPI and as above.  Physical Exam: General: Alert, no acute distress Cardiovascular: No pedal edema Respiratory: No cyanosis, no use of accessory musculature GI: No organomegaly, abdomen is soft and non-tender Skin: No lesions in the area of chief complaint Neurologic: Sensation intact distally Psychiatric: Patient is competent for consent with normal mood and affect Lymphatic: No axillary or cervical lymphadenopathy  MUSCULOSKELETAL:  - superficial abrasions of the RUE - NVI - mild swelling  Assessment: Displaced right proximal humerus fracture  Plan: - xrays and CT reviewed with the patient - discussed nonop vs ORIF and its associated risks and benefits - recommendation is for ORIF, patient in agreement - will plan for ORIF monday  Thank you for the consult and the opportunity to see [name] [name]. Glee Arvin, MD [facility] [phone] 8:52 AM

## 2017-12-17 ENCOUNTER — Encounter (HOSPITAL_COMMUNITY): Payer: Self-pay

## 2017-12-17 LAB — GLUCOSE, CAPILLARY
GLUCOSE-CAPILLARY: 106 mg/dL — AB (ref 65–99)
GLUCOSE-CAPILLARY: 102 mg/dL — AB (ref 65–99)
GLUCOSE-CAPILLARY: 95 mg/dL (ref 65–99)
Glucose-Capillary: 107 mg/dL — ABNORMAL HIGH (ref 65–99)
Glucose-Capillary: 111 mg/dL — ABNORMAL HIGH (ref 65–99)
Glucose-Capillary: 112 mg/dL — ABNORMAL HIGH (ref 65–99)
Glucose-Capillary: 136 mg/dL — ABNORMAL HIGH (ref 65–99)
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
## 2017-12-17 NOTE — Plan of Care (Signed)
  Problem: Education: Goal: Knowledge of General Education information will improve Outcome: Progressing   Problem: Health Behavior/Discharge Planning: Goal: Ability to manage health-related needs will improve Outcome: Progressing   Problem: Safety: Goal: Ability to remain free from injury will improve Outcome: Progressing   Problem: Skin Integrity: Goal: Risk for impaired skin integrity will decrease Outcome: Progressing   
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
## 2017-12-17 NOTE — Progress Notes (Signed)
Patient is doing well today.  Pain is controlled.  Plan is for ORIF of right proximal humerus tomorrow.  NPO after midnight.  Hold chemical DVT prophylaxis for surgery.

## 2017-12-18 ENCOUNTER — Inpatient Hospital Stay (HOSPITAL_COMMUNITY): Payer: Medicare HMO | Admitting: Certified Registered Nurse Anesthetist

## 2017-12-18 ENCOUNTER — Encounter (HOSPITAL_COMMUNITY): Payer: Self-pay | Admitting: Physical Medicine and Rehabilitation

## 2017-12-18 ENCOUNTER — Encounter (HOSPITAL_COMMUNITY): Payer: Self-pay

## 2017-12-18 ENCOUNTER — Inpatient Hospital Stay (HOSPITAL_COMMUNITY): Payer: Medicare HMO

## 2017-12-18 ENCOUNTER — Encounter (HOSPITAL_COMMUNITY): Admission: EM | Disposition: A | Discharge status: Rehabilitation Facility | Payer: Self-pay | Source: Home / Self Care

## 2017-12-18 DIAGNOSIS — S42201A Unspecified fracture of upper end of right humerus, initial encounter for closed fracture: Secondary | ICD-10-CM

## 2017-12-18 HISTORY — PX: ORIF HUMERUS FRACTURE: SHX2126

## 2017-12-18 LAB — BASIC METABOLIC PANEL
ANION GAP: 8 (ref 5–15)
BUN: 14 mg/dL (ref 6–20)
CHLORIDE: 113 mmol/L — AB (ref 101–111)
CO2: 22 mmol/L (ref 22–32)
Calcium: 7.9 mg/dL — ABNORMAL LOW (ref 8.9–10.3)
Creatinine, Ser: 1.01 mg/dL (ref 0.61–1.24)
GFR calc Af Amer: >60 mL/min (ref >60)
GLUCOSE: 91 mg/dL (ref 65–99)
POTASSIUM: 3.8 mmol/L (ref 3.5–5.1)
Sodium: 143 mmol/L (ref 135–145)

## 2017-12-18 LAB — CBC
HEMATOCRIT: 23.9 % — AB (ref 39.0–52.0)
HEMOGLOBIN: 7.9 g/dL — AB (ref 13.0–17.0)
MCH: 29 pg (ref 26.0–34.0)
MCHC: 33.1 g/dL (ref 30.0–36.0)
MCV: 87.9 fL (ref 78.0–100.0)
Platelets: 96 10*3/uL — ABNORMAL LOW (ref 150–400)
RBC: 2.72 MIL/uL — AB (ref 4.22–5.81)
RDW: 13.9 % (ref 11.5–15.5)
WBC: 5.2 10*3/uL (ref 4.0–10.5)

## 2017-12-18 LAB — GLUCOSE, CAPILLARY
GLUCOSE-CAPILLARY: 96 mg/dL (ref 65–99)
Glucose-Capillary: 91 mg/dL (ref 65–99)
Glucose-Capillary: 75 mg/dL (ref 65–99)
Glucose-Capillary: 108 mg/dL — ABNORMAL HIGH (ref 65–99)
Glucose-Capillary: 125 mg/dL — ABNORMAL HIGH (ref 65–99)

## 2017-12-18 LAB — PREPARE RBC (CROSSMATCH)

## 2017-12-18 SURGERY — OPEN REDUCTION INTERNAL FIXATION (ORIF) PROXIMAL HUMERUS FRACTURE
Anesthesia: General | Site: Shoulder | Laterality: Right

## 2017-12-18 MED ORDER — FENTANYL CITRATE (PF) 100 MCG/2ML IJ SOLN
50.0000 ug | Freq: Once | INTRAMUSCULAR | Status: AC
  Administered 2017-12-18: 50 ug via INTRAVENOUS

## 2017-12-18 MED ORDER — LACTATED RINGERS IV SOLN
INTRAVENOUS | Status: DC
  Administered 2017-12-18: 10:00:00 via INTRAVENOUS

## 2017-12-18 MED ORDER — FENTANYL CITRATE (PF) 250 MCG/5ML IJ SOLN
INTRAMUSCULAR | Status: AC
  Filled 2017-12-18: qty 5

## 2017-12-18 MED ORDER — TRANEXAMIC ACID 1000 MG/10ML IV SOLN
2000.0000 mg | INTRAVENOUS | Status: DC
  Filled 2017-12-18: qty 20

## 2017-12-18 MED ORDER — MIDAZOLAM HCL 2 MG/2ML IJ SOLN
INTRAMUSCULAR | Status: AC
  Filled 2017-12-18: qty 2

## 2017-12-18 MED ORDER — FENTANYL CITRATE (PF) 100 MCG/2ML IJ SOLN
INTRAMUSCULAR | Status: AC
  Administered 2017-12-18: 50 ug via INTRAVENOUS
  Filled 2017-12-18: qty 2

## 2017-12-18 MED ORDER — SUGAMMADEX SODIUM 500 MG/5ML IV SOLN
INTRAVENOUS | Status: AC
Start: 2017-12-18 — End: ?
  Filled 2017-12-18: qty 5

## 2017-12-18 MED ORDER — CALCIUM CARBONATE-VITAMIN D 500-200 MG-UNIT PO TABS: 1.0000 | ORAL_TABLET | Freq: Three times a day (TID) | ORAL | 12 refills | Status: AC

## 2017-12-18 MED ORDER — SODIUM CHLORIDE 0.9 % IV SOLN: Freq: Once | INTRAVENOUS | Status: DC

## 2017-12-18 MED ORDER — ROCURONIUM BROMIDE 10 MG/ML (PF) SYRINGE
PREFILLED_SYRINGE | INTRAVENOUS | Status: DC | PRN
  Administered 2017-12-18: 50 mg via INTRAVENOUS
  Administered 2017-12-18: 20 mg via INTRAVENOUS

## 2017-12-18 MED ORDER — POLYETHYLENE GLYCOL 3350 17 G PO PACK
17.0000 g | PACK | Freq: Every day | ORAL | Status: DC
  Administered 2017-12-19 – 2017-12-20 (×2): 17 g via ORAL
  Filled 2017-12-18 (×2): qty 1

## 2017-12-18 MED ORDER — MIDAZOLAM HCL 2 MG/2ML IJ SOLN
INTRAMUSCULAR | Status: AC
  Administered 2017-12-18: 1 mg via INTRAVENOUS
  Filled 2017-12-18: qty 2

## 2017-12-18 MED ORDER — DOCUSATE SODIUM 100 MG PO CAPS
100.0000 mg | ORAL_CAPSULE | Freq: Two times a day (BID) | ORAL | Status: DC
  Administered 2017-12-18 – 2017-12-20 (×4): 100 mg via ORAL
  Filled 2017-12-18 (×4): qty 1

## 2017-12-18 MED ORDER — VANCOMYCIN HCL 500 MG IV SOLR
INTRAVENOUS | Status: AC
  Filled 2017-12-18: qty 1000

## 2017-12-18 MED ORDER — PROMETHAZINE HCL 25 MG/ML IJ SOLN: 6.2500 mg | INTRAMUSCULAR | Status: DC | PRN

## 2017-12-18 MED ORDER — CLINDAMYCIN PHOSPHATE 600 MG/50ML IV SOLN: 600.0000 mg | Freq: Once | INTRAVENOUS | Status: DC

## 2017-12-18 MED ORDER — BUPIVACAINE HCL (PF) 0.25 % IJ SOLN
INTRAMUSCULAR | Status: AC
  Filled 2017-12-18: qty 30

## 2017-12-18 MED ORDER — FENTANYL CITRATE (PF) 100 MCG/2ML IJ SOLN: 25.0000 ug | INTRAMUSCULAR | Status: DC | PRN

## 2017-12-18 MED ORDER — DEXAMETHASONE SODIUM PHOSPHATE 10 MG/ML IJ SOLN
INTRAMUSCULAR | Status: DC | PRN
  Administered 2017-12-18: 5 mg via INTRAVENOUS

## 2017-12-18 MED ORDER — ROPIVACAINE HCL 5 MG/ML IJ SOLN
INTRAMUSCULAR | Status: DC | PRN
  Administered 2017-12-18: 25 mL via PERINEURAL

## 2017-12-18 MED ORDER — ZINC SULFATE 220 (50 ZN) MG PO CAPS: 220.0000 mg | ORAL_CAPSULE | Freq: Every day | ORAL | 0 refills | Status: DC

## 2017-12-18 MED ORDER — TRANEXAMIC ACID 1000 MG/10ML IV SOLN
INTRAVENOUS | Status: DC | PRN
  Administered 2017-12-18: 2000 mg via TOPICAL

## 2017-12-18 MED ORDER — CLONIDINE HCL (ANALGESIA) 100 MCG/ML EP SOLN
EPIDURAL | Status: DC | PRN
  Administered 2017-12-18: 50 ug

## 2017-12-18 MED ORDER — SUGAMMADEX SODIUM 500 MG/5ML IV SOLN
INTRAVENOUS | Status: DC | PRN
  Administered 2017-12-18: 250 mg via INTRAVENOUS

## 2017-12-18 MED ORDER — MIDAZOLAM HCL 2 MG/2ML IJ SOLN
1.0000 mg | Freq: Once | INTRAMUSCULAR | Status: AC
  Administered 2017-12-18: 1 mg via INTRAVENOUS
  Filled 2017-12-18: qty 1

## 2017-12-18 MED ORDER — CHLORHEXIDINE GLUCONATE 4 % EX LIQD
60.0000 mL | Freq: Once | CUTANEOUS | Status: DC
  Filled 2017-12-18: qty 60

## 2017-12-18 MED ORDER — ONDANSETRON HCL 4 MG/2ML IJ SOLN
INTRAMUSCULAR | Status: AC
  Filled 2017-12-18: qty 2

## 2017-12-18 MED ORDER — PHENYLEPHRINE HCL 10 MG/ML IJ SOLN
INTRAVENOUS | Status: DC | PRN
  Administered 2017-12-18: 25 ug/min via INTRAVENOUS

## 2017-12-18 MED ORDER — DEXAMETHASONE SODIUM PHOSPHATE 10 MG/ML IJ SOLN
INTRAMUSCULAR | Status: AC
  Filled 2017-12-18: qty 1

## 2017-12-18 MED ORDER — CLINDAMYCIN PHOSPHATE 900 MG/50ML IV SOLN
900.0000 mg | INTRAVENOUS | Status: AC
  Administered 2017-12-18: 900 mg via INTRAVENOUS
  Filled 2017-12-18: qty 50

## 2017-12-18 MED ORDER — VANCOMYCIN HCL 500 MG IV SOLR
INTRAVENOUS | Status: DC | PRN
  Administered 2017-12-18: 1000 mg

## 2017-12-18 MED ORDER — ALBUMIN HUMAN 5 % IV SOLN
INTRAVENOUS | Status: DC | PRN
  Administered 2017-12-18: 11:00:00 via INTRAVENOUS

## 2017-12-18 MED ORDER — LIDOCAINE 2% (20 MG/ML) 5 ML SYRINGE
INTRAMUSCULAR | Status: AC
  Filled 2017-12-18: qty 5

## 2017-12-18 MED ORDER — ONDANSETRON HCL 4 MG/2ML IJ SOLN
INTRAMUSCULAR | Status: DC | PRN
  Administered 2017-12-18: 4 mg via INTRAVENOUS

## 2017-12-18 MED ORDER — 0.9 % SODIUM CHLORIDE (POUR BTL) OPTIME
TOPICAL | Status: DC | PRN
  Administered 2017-12-18 (×3): 1000 mL

## 2017-12-18 MED ORDER — LACTATED RINGERS IV SOLN: INTRAVENOUS | Status: DC

## 2017-12-18 MED ORDER — LIDOCAINE 2% (20 MG/ML) 5 ML SYRINGE
INTRAMUSCULAR | Status: DC | PRN
  Administered 2017-12-18: 60 mg via INTRAVENOUS

## 2017-12-18 MED ORDER — PROPOFOL 10 MG/ML IV BOLUS
INTRAVENOUS | Status: DC | PRN
  Administered 2017-12-18: 100 mg via INTRAVENOUS

## 2017-12-18 MED ORDER — FENTANYL CITRATE (PF) 100 MCG/2ML IJ SOLN
INTRAMUSCULAR | Status: DC | PRN
  Administered 2017-12-18: 50 ug via INTRAVENOUS

## 2017-12-18 MED ORDER — CHLORHEXIDINE GLUCONATE 4 % EX LIQD: 60.0000 mL | Freq: Once | CUTANEOUS | Status: DC

## 2017-12-18 MED ORDER — ROCURONIUM BROMIDE 10 MG/ML (PF) SYRINGE
PREFILLED_SYRINGE | INTRAVENOUS | Status: AC
  Filled 2017-12-18: qty 5

## 2017-12-18 SURGICAL SUPPLY — 70 items
BANDAGE ACE 4X5 VEL STRL LF (GAUZE/BANDAGES/DRESSINGS) ×1 IMPLANT
BIT DRILL 3.2 (BIT) ×2
BIT DRILL 3.2XCALB NS DISP (BIT) IMPLANT
BIT DRILL CALIBRATED 2.7 (BIT) ×1 IMPLANT
BIT DRL 3.2XCALB NS DISP (BIT) ×1
BLADE SURG 10 STRL SS (BLADE) IMPLANT
BNDG CMPR 9X4 STRL LF SNTH (GAUZE/BANDAGES/DRESSINGS) ×1
BNDG COHESIVE 6X5 TAN STRL LF (GAUZE/BANDAGES/DRESSINGS) ×2 IMPLANT
BNDG ESMARK 4X9 LF (GAUZE/BANDAGES/DRESSINGS) ×1 IMPLANT
BNDG GAUZE ELAST 4 BULKY (GAUZE/BANDAGES/DRESSINGS) ×1 IMPLANT
COVER SURGICAL LIGHT HANDLE (MISCELLANEOUS) ×2 IMPLANT
CUFF TOURNIQUET SINGLE 18IN (TOURNIQUET CUFF) ×2 IMPLANT
CUFF TOURNIQUET SINGLE 24IN (TOURNIQUET CUFF) IMPLANT
DRAPE C-ARM 42X72 X-RAY (DRAPES) ×2 IMPLANT
DRAPE IMP U-DRAPE 54X76 (DRAPES) ×3 IMPLANT
DRAPE INCISE IOBAN 66X45 STRL (DRAPES) ×3 IMPLANT
DRAPE U-SHAPE 47X51 STRL (DRAPES) ×1 IMPLANT
DRSG TEGADERM 4X4.75 (GAUZE/BANDAGES/DRESSINGS) ×5 IMPLANT
ELECT CAUTERY BLADE 6.4 (BLADE) ×2 IMPLANT
ELECT REM PT RETURN 9FT ADLT (ELECTROSURGICAL) ×2
ELECTRODE REM PT RTRN 9FT ADLT (ELECTROSURGICAL) ×1 IMPLANT
FACESHIELD WRAPAROUND (MASK) IMPLANT
FACESHIELD WRAPAROUND OR TEAM (MASK) ×1 IMPLANT
GAUZE SPONGE 4X4 12PLY STRL (GAUZE/BANDAGES/DRESSINGS) ×2 IMPLANT
GAUZE XEROFORM 1X8 LF (GAUZE/BANDAGES/DRESSINGS) ×1 IMPLANT
GAUZE XEROFORM 5X9 LF (GAUZE/BANDAGES/DRESSINGS) ×2 IMPLANT
GLOVE BIOGEL PI IND STRL 7.0 (GLOVE) ×1 IMPLANT
GLOVE BIOGEL PI INDICATOR 7.0 (GLOVE) ×1
GLOVE ECLIPSE 7.0 STRL STRAW (GLOVE) ×2 IMPLANT
GLOVE SKINSENSE NS SZ7.5 (GLOVE) ×1
GLOVE SKINSENSE STRL SZ7.5 (GLOVE) ×2 IMPLANT
GOWN STRL REIN XL XLG (GOWN DISPOSABLE) ×6 IMPLANT
K-WIRE 2X5 SS THRDED S3 (WIRE) ×2
KIT BASIN OR (CUSTOM PROCEDURE TRAY) ×2 IMPLANT
KIT TURNOVER KIT B (KITS) ×2 IMPLANT
KWIRE 2X5 SS THRDED S3 (WIRE) IMPLANT
MANIFOLD NEPTUNE II (INSTRUMENTS) ×2 IMPLANT
NS IRRIG 1000ML POUR BTL (IV SOLUTION) ×2 IMPLANT
PACK SHOULDER (CUSTOM PROCEDURE TRAY) ×2 IMPLANT
PACK UNIVERSAL I (CUSTOM PROCEDURE TRAY) ×2 IMPLANT
PAD ARMBOARD 7.5X6 YLW CONV (MISCELLANEOUS) ×4 IMPLANT
PAD CAST 4YDX4 CTTN HI CHSV (CAST SUPPLIES) ×1 IMPLANT
PADDING CAST COTTON 4X4 STRL (CAST SUPPLIES) ×2
PEG LOCKING 3.2MMX46 (Peg) ×1 IMPLANT
PEG LOCKING 3.2X36 (Screw) ×1 IMPLANT
PEG LOCKING 3.2X42 (Screw) ×1 IMPLANT
PEG LOCKING 3.2X48 (Peg) ×1 IMPLANT
PEG LOCKING 3.2X50 (Screw) ×2 IMPLANT
PEG LOCKING 3.2X58MM (Peg) ×1 IMPLANT
PLATE 3HOLE HUMERUS PROX RT (Plate) ×1 IMPLANT
SCREW LOW PROF TIS 3.5X28MM (Screw) ×1 IMPLANT
SCREW LOW PROFILE 3.5X30MM TIS (Screw) ×1 IMPLANT
SLING ARM IMMOBILIZER LRG (SOFTGOODS) ×1 IMPLANT
SPONGE LAP 18X18 X RAY DECT (DISPOSABLE) ×2 IMPLANT
STAPLER VISISTAT 35W (STAPLE) IMPLANT
SUCTION FRAZIER HANDLE 10FR (MISCELLANEOUS) ×1
SUCTION TUBE FRAZIER 10FR DISP (MISCELLANEOUS) IMPLANT
SUT ETHILON 2 0 FS 18 (SUTURE) ×2 IMPLANT
SUT ETHILON 3 0 PS 1 (SUTURE) ×3 IMPLANT
SUT FIBERWIRE #2 38 T-5 BLUE (SUTURE) ×2
SUT VIC AB 0 CT1 27 (SUTURE) ×4
SUT VIC AB 0 CT1 27XBRD ANBCTR (SUTURE) ×2 IMPLANT
SUT VIC AB 2-0 CT1 27 (SUTURE) ×10
SUT VIC AB 2-0 CT1 TAPERPNT 27 (SUTURE) ×2 IMPLANT
SUTURE FIBERWR #2 38 T-5 BLUE (SUTURE) IMPLANT
SYR CONTROL 10ML LL (SYRINGE) IMPLANT
TOWEL OR 17X24 6PK STRL BLUE (TOWEL DISPOSABLE) ×1 IMPLANT
TOWEL OR 17X26 10 PK STRL BLUE (TOWEL DISPOSABLE) ×2 IMPLANT
UNDERPAD 30X30 (UNDERPADS AND DIAPERS) ×3 IMPLANT
WATER STERILE IRR 1000ML POUR (IV SOLUTION) ×2 IMPLANT
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
## 2017-12-18 NOTE — Discharge Instructions (Signed)
Postoperative instructions: ° °Weightbearing instructions: non weight bearing ° °Dressing instructions: Keep your dressing and/or splint clean and dry at all times.  It will be removed at your first post-operative appointment.  Your stitches and/or staples will be removed at this visit. ° °Incision instructions:  Do not soak your incision for 3 weeks after surgery.  If the incision gets wet, pat dry and do not scrub the incision. ° °Pain control:  You have been given a prescription to be taken as directed for post-operative pain control.  In addition, elevate the operative extremity above the heart at all times to prevent swelling and throbbing pain. ° °Take over-the-counter Colace, 100mg by mouth twice a day while taking narcotic pain medications to help prevent constipation. ° °Follow up appointments: °1) 10-14 days for suture removal and wound check. °2) Dr. Nichalas Coin as scheduled. ° ° ------------------------------------------------------------------------------------------------------------- ° °After Surgery Pain Control: ° °After your surgery, post-surgical discomfort or pain is likely. This discomfort can last several days to a few weeks. At certain times of the day your discomfort may be more intense.  °Did you receive a nerve block?  °A nerve block can provide pain relief for one hour to two days after your surgery. As long as the nerve block is working, you will experience little or no sensation in the area the surgeon operated on.  °As the nerve block wears off, you will begin to experience pain or discomfort. It is very important that you begin taking your prescribed pain medication before the nerve block fully wears off. Treating your pain at the first sign of the block wearing off will ensure your pain is better controlled and more tolerable when full-sensation returns. Do not wait until the pain is intolerable, as the medicine will be less effective. It is better to treat pain in advance than to try and  catch up.  °General Anesthesia:  °If you did not receive a nerve block during your surgery, you will need to start taking your pain medication shortly after your surgery and should continue to do so as prescribed by your surgeon.  °Pain Medication:  °Most commonly we prescribe Vicodin and Percocet for post-operative pain. Both of these medications contain a combination of acetaminophen (Tylenol®) and a narcotic to help control pain.  °· It takes between 30 and 45 minutes before pain medication starts to work. It is important to take your medication before your pain level gets too intense.  °· Nausea is a common side effect of many pain medications. You will want to eat something before taking your pain medicine to help prevent nausea.  °· If you are taking a prescription pain medication that contains acetaminophen, we recommend that you do not take additional over the counter acetaminophen (Tylenol®).  °Other pain relieving options:  °· Using a cold pack to ice the affected area a few times a day (15 to 20 minutes at a time) can help to relieve pain, reduce swelling and bruising.  °· Elevation of the affected area can also help to reduce pain and swelling. ° ° ° °

## 2017-12-18 NOTE — Progress Notes (Signed)
OT Cancellation Note  Patient Details Name: [name] [name]: [id number] DOB: [birth year]   Cancelled Treatment:    Reason Eval/Treat Not Completed: Patient at procedure or test/ unavailable(SX). OT will continue to follow for eval.   Evern BioLaura J Keva Darty 12/18/2017, 12:45 PM   Sherryl MangesLaura Janequa Kipnis OTR/L [phone]

## 2017-12-18 NOTE — Anesthesia Procedure Notes (Signed)
Anesthesia Procedure Image    

## 2017-12-18 NOTE — Op Note (Signed)
   Date of Surgery: 12/18/2017  INDICATIONS: Mr. [name] [name] is a 72 y.o.-year-old male with a right proximal humerus fracture;  The patient did consent to the procedure after discussion of the risks and benefits.  PREOPERATIVE DIAGNOSIS: Right proximal humerus fracture  POSTOPERATIVE DIAGNOSIS: Same.  PROCEDURE: Open reduction internal fixation of right proximal humerus fracture  SURGEON: N. Glee ArvinMichael Arham Symmonds, M.D.  ASSIST: Starlyn SkeansMary Lindsey [place], [place]; necessary for the timely completion of procedure and due to complexity of procedure.  ANESTHESIA:  general  IV FLUIDS AND URINE: See anesthesia.  ESTIMATED BLOOD LOSS: 100 mL.  IMPLANTS: Biomet proximal humerus plate  DRAINS: None  COMPLICATIONS: None.  DESCRIPTION OF PROCEDURE: The patient was brought to the operating room and placed supine on the operating table.  The patient had been signed prior to the procedure and this was documented. The patient had the anesthesia placed by the anesthesiologist.  A time-out was performed to confirm that this was the correct patient, site, side and location. The patient did receive antibiotics prior to the incision and was re-dosed during the procedure as needed at indicated intervals.  The patient had the operative extremity prepped and draped in the standard surgical fashion.    An anterior lateral acromial approach to the proximal humerus was used.  Blunt dissection was taken down to the deltoid muscle.  The fibers were then bluntly spread along the length of the fibers.  The branch of the axillary nerve was identified and mobilized and protected.  We continued our dissection down onto the proximal humerus fracture.  With the fracture exposed we then obtained a reduction which was confirmed under fluoroscopy under orthogonal views.  Once this was accomplished I then placed a proximal humerus plate on the lateral aspect at the appropriate position.  Provisional fixation with K wires was performed.  I then  placed 2 nonlocking screws to the distal end of the plate into the humeral shaft which had excellent purchase.  I then placed multiple locking pegs into the humeral head up to the subchondral surface.  I then placed additional nonlocking screws through the humeral shaft.  K wires were then removed.  Live fluoroscopy was performed to confirm no penetration of the locking pegs.  The wound was then thoroughly irrigated with 3 L normal saline.  1 g of vancomycin powder was placed in the surgical bed.  Surgical wound was closed in layered fashion using 2-0 Vicryl and 3-0 nylon.  Sterile dressings were applied.  Shoulder sling was placed.  Patient tolerated procedure well had no immediate complications.  POSTOPERATIVE PLAN: Patient will be nonweightbearing to the right upper extremity for 6 weeks.  He may begin gentle pendulum exercises in a week.  Mayra ReelN. Michael Takao Lizer, MD [facility] [phone] 12:00 PM

## 2017-12-18 NOTE — Anesthesia Preprocedure Evaluation (Signed)
Anesthesia Evaluation  Patient identified by MRN, date of birth, ID band Patient awake  General Assessment Comment:Mixed density left side subdural hematoma, most pronounced along the left posterosuperior convexity (7-8 mm thickness), and 3-4 mm thickness elsewhere. 2. Trace posttraumatic appearing right superior convexity subarachnoid hemorrhage. 3. No significant intracranial mass effect. No skull fracture identified.  CT CERVICAL SPINE:  1. Comminuted but minimally to nondisplaced fractures of the C2, C3, and C4 left side lamina and facets. The pedicles and vertebral bodies remain intact. 2. No other cervical spine fracture identified. 3. Prior cervical ACDF with solid arthrodesis C4 through C6 and also at C7-T1.  Reviewed: Allergy & Precautions, NPO status , Patient's Chart, lab work & pertinent test results  Airway Mallampati: II  TM Distance: >3 FB Neck ROM: Limited    Dental no notable dental hx.    Pulmonary COPD,    Pulmonary exam normal breath sounds clear to auscultation       Cardiovascular hypertension, Pt. on medications Normal cardiovascular exam Rhythm:Regular Rate:Normal     Neuro/Psych negative neurological ROS  negative psych ROS   GI/Hepatic negative GI ROS, Neg liver ROS,   Endo/Other  diabetes  Renal/GU negative Renal ROS  negative genitourinary   Musculoskeletal negative musculoskeletal ROS (+)   Abdominal   Peds negative pediatric ROS (+)  Hematology  (+) anemia ,   Anesthesia Other Findings   Reproductive/Obstetrics negative OB ROS                             Anesthesia Physical Anesthesia Plan  ASA: III  Anesthesia Plan: General   Post-op Pain Management:  Regional for Post-op pain   Induction: Intravenous  PONV Risk Score and Plan: 2 and Ondansetron, Dexamethasone and Treatment may vary due to age or medical condition  Airway Management  Planned: Oral ETT  Additional Equipment:   Intra-op Plan:   Post-operative Plan: Extubation in OR  Informed Consent: I have reviewed the patients History and Physical, chart, labs and discussed the procedure including the risks, benefits and alternatives for the proposed anesthesia with the patient or authorized representative who has indicated his/her understanding and acceptance.   Dental advisory given  Plan Discussed with: CRNA and Surgeon  Anesthesia Plan Comments:         Anesthesia Quick Evaluation

## 2017-12-18 NOTE — Anesthesia Postprocedure Evaluation (Signed)
Anesthesia Post Note  Patient: [name] [name]  Procedure(s) Performed: OPEN REDUCTION INTERNAL FIXATION (ORIF) PROXIMAL HUMERUS FRACTURE (Right Shoulder)     Patient location during evaluation: PACU Anesthesia Type: General Level of consciousness: awake and alert Pain management: pain level controlled Vital Signs Assessment: post-procedure vital signs reviewed and stable Respiratory status: spontaneous breathing, nonlabored ventilation, respiratory function stable and patient connected to nasal cannula oxygen Cardiovascular status: blood pressure returned to baseline and stable Postop Assessment: no apparent nausea or vomiting Anesthetic complications: no    Last Vitals:  Vitals:   12/18/17 1253 12/18/17 1305  BP: 120/64 115/70  Pulse: 70 75  Resp: 17 13  Temp:    SpO2: 99% 98%    Last Pain:  Vitals:   12/18/17 1305  TempSrc:   PainSc: Asleep                 Radames Mejorado S
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
## 2017-12-18 NOTE — Progress Notes (Signed)
SLP Cancellation Note  Patient Details Name: [name] [name] MRN: [id number] DOB: 03-10-1946   Cancelled treatment:       Reason Eval/Treat Not Completed: Patient at procedure or test/unavailable (ORIF).  Will follow for eval.    Blenda MountsCouture, Roselia Snipe Laurice 12/18/2017, 3:08 PM

## 2017-12-18 NOTE — Anesthesia Procedure Notes (Signed)
Procedure Name: Intubation Date/Time: 12/18/2017 10:47 AM Performed by: [name] [name], [name] Shiflett A, CRNA Pre-anesthesia Checklist: Patient identified, Emergency Drugs available, Suction available and Patient being monitored Patient Re-evaluated:Patient Re-evaluated prior to induction Oxygen Delivery Method: Circle system utilized Preoxygenation: Pre-oxygenation with 100% oxygen Induction Type: IV induction Ventilation: Mask ventilation without difficulty and Oral airway inserted - appropriate to patient size Laryngoscope Size: Glidescope and 4 Grade View: Grade I Tube type: Oral Tube size: 7.5 mm Number of attempts: 1 Airway Equipment and Method: Rigid stylet and Video-laryngoscopy Placement Confirmation: ETT inserted through vocal cords under direct vision,  positive ETCO2 and breath sounds checked- equal and bilateral Secured at: 22 cm Tube secured with: Tape Dental Injury: Teeth and Oropharynx as per pre-operative assessment  Difficulty Due To: Difficulty was anticipated and Difficult Airway- due to cervical collar Comments: Glidescope 4 used as patient was in cervical collar.

## 2017-12-18 NOTE — H&P (Signed)
H&P update  The surgical history has been reviewed and remains accurate without interval change.  The patient was re-examined and patient's physiologic condition has not changed significantly in the last 30 days. The condition still exists that makes this procedure necessary. The treatment plan remains the same, without new options for care.  No new pharmacological allergies or types of therapy has been initiated that would change the plan or the appropriateness of the plan.  The patient and/or family understand the potential benefits and risks.  Mayra ReelN. Michael Xu, MD 12/18/2017 7:35 AM

## 2017-12-18 NOTE — Anesthesia Procedure Notes (Signed)
Anesthesia Regional Block: Interscalene brachial plexus block   Pre-Anesthetic Checklist: ,, timeout performed, Correct Patient, Correct Site, Correct Laterality, Correct Procedure, Correct Position, site marked, Risks and benefits discussed,  Surgical consent,  Pre-op evaluation,  At surgeon's request and post-op pain management  Laterality: Right  Prep: chloraprep       Needles:  Injection technique: Single-shot  Needle Type: Echogenic Needle     Needle Length: 9cm      Additional Needles:   Procedures:,,,, ultrasound used (permanent image in chart),,,,  Narrative:  Start time: 12/18/2017 10:15 AM End time: 12/18/2017 10:22 AM Injection made incrementally with aspirations every 5 mL.  Performed by: Personally  Anesthesiologist: Eilene Ghaziose, Darleene Cumpian, MD  Additional Notes: Patient tolerated the procedure well without complications

## 2017-12-18 NOTE — Transfer of Care (Signed)
Immediate Anesthesia Transfer of Care Note  Patient: [name] [name]  Procedure(s) Performed: OPEN REDUCTION INTERNAL FIXATION (ORIF) PROXIMAL HUMERUS FRACTURE (Right Shoulder)  Patient Location: PACU  Anesthesia Type:GA combined with regional for post-op pain  Level of Consciousness: drowsy and patient cooperative  Airway & Oxygen Therapy: Patient Spontanous Breathing and Patient connected to nasal cannula oxygen  Post-op Assessment: Report given to RN and Post -op Vital signs reviewed and stable  Post vital signs: Reviewed and stable  Last Vitals:  Vitals Value Taken Time  BP 101/62 12/18/2017 12:38 PM  Temp    Pulse    Resp 18 12/18/2017 12:41 PM  SpO2    Vitals shown include unvalidated device data.  Last Pain:  Vitals:   12/18/17 0300  TempSrc: Oral  PainSc:       Patients Stated Pain Goal: 3 (12/16/17 0800)  Complications: No apparent anesthesia complications

## 2017-12-18 NOTE — Progress Notes (Signed)
1900: Handoff report received from RN. Pt resting with wife at bedside. Discussed plan of care for the shift; pt amenable to plan.  0000: Pt resting comfortably.  0400: Pt continues resting comfortably.  0700: Handoff report given to RN. No acute events overnight.  
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
## 2017-12-19 ENCOUNTER — Encounter (HOSPITAL_COMMUNITY): Payer: Self-pay | Admitting: Orthopaedic Surgery

## 2017-12-19 DIAGNOSIS — I10 Essential (primary) hypertension: Secondary | ICD-10-CM

## 2017-12-19 DIAGNOSIS — D62 Acute posthemorrhagic anemia: Secondary | ICD-10-CM

## 2017-12-19 DIAGNOSIS — S069X9A Unspecified intracranial injury with loss of consciousness of unspecified duration, initial encounter: Secondary | ICD-10-CM

## 2017-12-19 DIAGNOSIS — R0682 Tachypnea, not elsewhere classified: Secondary | ICD-10-CM

## 2017-12-19 DIAGNOSIS — D696 Thrombocytopenia, unspecified: Secondary | ICD-10-CM

## 2017-12-19 DIAGNOSIS — S12101A Unspecified nondisplaced fracture of second cervical vertebra, initial encounter for closed fracture: Secondary | ICD-10-CM

## 2017-12-19 DIAGNOSIS — S12301A Unspecified nondisplaced fracture of fourth cervical vertebra, initial encounter for closed fracture: Secondary | ICD-10-CM

## 2017-12-19 DIAGNOSIS — E119 Type 2 diabetes mellitus without complications: Secondary | ICD-10-CM

## 2017-12-19 DIAGNOSIS — S065X9A Traumatic subdural hemorrhage with loss of consciousness of unspecified duration, initial encounter: Secondary | ICD-10-CM

## 2017-12-19 DIAGNOSIS — G4733 Obstructive sleep apnea (adult) (pediatric): Secondary | ICD-10-CM

## 2017-12-19 DIAGNOSIS — J449 Chronic obstructive pulmonary disease, unspecified: Secondary | ICD-10-CM

## 2017-12-19 DIAGNOSIS — T148XXA Other injury of unspecified body region, initial encounter: Secondary | ICD-10-CM

## 2017-12-19 DIAGNOSIS — S12201A Unspecified nondisplaced fracture of third cervical vertebra, initial encounter for closed fracture: Secondary | ICD-10-CM

## 2017-12-19 DIAGNOSIS — I609 Nontraumatic subarachnoid hemorrhage, unspecified: Secondary | ICD-10-CM

## 2017-12-19 DIAGNOSIS — S065XAA Traumatic subdural hemorrhage with loss of consciousness status unknown, initial encounter: Secondary | ICD-10-CM

## 2017-12-19 DIAGNOSIS — S42201A Unspecified fracture of upper end of right humerus, initial encounter for closed fracture: Secondary | ICD-10-CM

## 2017-12-19 LAB — BASIC METABOLIC PANEL
Anion gap: 8 (ref 5–15)
BUN: 22 mg/dL — AB (ref 6–20)
CO2: 21 mmol/L — ABNORMAL LOW (ref 22–32)
Calcium: 7.9 mg/dL — ABNORMAL LOW (ref 8.9–10.3)
Chloride: 112 mmol/L — ABNORMAL HIGH (ref 101–111)
Creatinine, Ser: 0.99 mg/dL (ref 0.61–1.24)
GFR calc Af Amer: >60 mL/min (ref >60)
GLUCOSE: 107 mg/dL — AB (ref 65–99)
Potassium: 4.2 mmol/L (ref 3.5–5.1)
Sodium: 141 mmol/L (ref 135–145)

## 2017-12-19 LAB — BPAM RBC
BLOOD PRODUCT EXPIRATION DATE: 201906182359
BLOOD PRODUCT EXPIRATION DATE: 201907022359
Blood Product Expiration Date: 201907022359
Blood Product Expiration Date: 201907052359
ISSUE DATE / TIME: 201906071237
ISSUE DATE / TIME: 201906101100
ISSUE DATE / TIME: 201906071237
ISSUE DATE / TIME: 201906101100
Unit Type and Rh: 6200
Unit Type and Rh: 6200
Unit Type and Rh: 9500
Unit Type and Rh: 9500

## 2017-12-19 LAB — TYPE AND SCREEN
ABO/RH(D): A POS
Antibody Screen: NEGATIVE
UNIT DIVISION: 0
Unit division: 0
Unit division: 0
Unit division: 0

## 2017-12-19 LAB — GLUCOSE, CAPILLARY
GLUCOSE-CAPILLARY: 109 mg/dL — AB (ref 65–99)
GLUCOSE-CAPILLARY: 115 mg/dL — AB (ref 65–99)
Glucose-Capillary: 104 mg/dL — ABNORMAL HIGH (ref 65–99)
Glucose-Capillary: 153 mg/dL — ABNORMAL HIGH (ref 65–99)

## 2017-12-19 LAB — CBC
HCT: 21.6 % — ABNORMAL LOW (ref 39.0–52.0)
Hemoglobin: 7.3 g/dL — ABNORMAL LOW (ref 13.0–17.0)
MCH: 29.3 pg (ref 26.0–34.0)
MCHC: 33.8 g/dL (ref 30.0–36.0)
MCV: 86.7 fL (ref 78.0–100.0)
PLATELETS: 110 10*3/uL — AB (ref 150–400)
RBC: 2.49 MIL/uL — AB (ref 4.22–5.81)
RDW: 13.8 % (ref 11.5–15.5)
WBC: 6.1 10*3/uL (ref 4.0–10.5)

## 2017-12-19 MED ORDER — METHOCARBAMOL 500 MG PO TABS
500.0000 mg | ORAL_TABLET | Freq: Three times a day (TID) | ORAL | Status: DC | PRN
  Administered 2017-12-19 – 2017-12-20 (×2): 500 mg via ORAL
  Filled 2017-12-19 (×2): qty 1

## 2017-12-19 MED ORDER — ENOXAPARIN SODIUM 30 MG/0.3ML ~~LOC~~ SOLN
30.0000 mg | SUBCUTANEOUS | Status: DC
  Administered 2017-12-19 – 2017-12-20 (×2): 30 mg via SUBCUTANEOUS
  Filled 2017-12-19 (×2): qty 0.3
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
## 2017-12-19 NOTE — Progress Notes (Signed)
Inpatient Rehabilitation Admissions Coordinator  I met with patient and his wife at bedside to discus goals and expectations of an inpt rehab admit. They prefer CIR and are in agreement. I will begin insurance authorization with [employer] and await insurance approval to admit.  Danne Baxter, RN, MSN Rehab Admissions Coordinator [phone] 12/19/2017 1:44 PM

## 2017-12-19 NOTE — Care Management Important Message (Signed)
Important Message  Patient Details  Name: [name] [name] MRN: [id number] Date of Birth: 11-26-1945   Medicare Important Message Given:  Yes    Dorena BodoIris Liz Pinho 12/19/2017, 1:09 PM

## 2017-12-19 NOTE — Progress Notes (Signed)
Subjective: 1 Day Post-Op Procedure(s) (LRB): OPEN REDUCTION INTERNAL FIXATION (ORIF) PROXIMAL HUMERUS FRACTURE (Right) Patient reports pain as mild.  Feeling ok this am.    Objective: Vital signs in last 24 hours: Temp:  [97.5 F (36.4 C)-99.1 F (37.3 C)] 98.4 F (36.9 C) (06/11 0300) Pulse Rate:  [70-106] 76 (06/11 0400) Resp:  [11-20] 11 (06/11 0400) BP: (101-146)/(61-76) 122/66 (06/11 0400) SpO2:  [94 %-100 %] 97 % (06/11 0400)  Intake/Output from previous day: 06/10 0701 - 06/11 0700 In: 1270.2 [I.V.:970.2; IV Piggyback:300] Out: 2100 [Urine:2000; Blood:100] Intake/Output this shift: No intake/output data recorded.  Recent Labs    12/18/17 0858  HGB 7.9*   Recent Labs    12/18/17 0858  WBC 5.2  RBC 2.72*  HCT 23.9*  PLT 96*   Recent Labs    12/18/17 0858  NA 143  K 3.8  CL 113*  CO2 22  BUN 14  CREATININE 1.01  GLUCOSE 91  CALCIUM 7.9*   No results for input(s): LABPT, INR in the last 72 hours.  Neurologically intact Neurovascular intact Sensation intact distally Intact pulses distally Dorsiflexion/Plantar flexion intact Incision: scant drainage No cellulitis present Compartment soft    Assessment/Plan: 1 Day Post-Op Procedure(s) (LRB): OPEN REDUCTION INTERNAL FIXATION (ORIF) PROXIMAL HUMERUS FRACTURE (Right) RUE-sling at all times.  NWB. Ice and elevate.  May begin pendulum swings in one week     Cristie HemMary L Stanbery 12/19/2017, 7:52 AM
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
## 2017-12-20 ENCOUNTER — Encounter (HOSPITAL_COMMUNITY): Payer: Self-pay | Admitting: *Deleted

## 2017-12-20 ENCOUNTER — Encounter (HOSPITAL_COMMUNITY): Payer: Self-pay | Admitting: Physical Medicine and Rehabilitation

## 2017-12-20 ENCOUNTER — Inpatient Hospital Stay (HOSPITAL_COMMUNITY)
Admission: RE | Admit: 2017-12-20 | Discharge: 2018-01-02 | DRG: 949 | Disposition: A | Discharge status: Home Health | Payer: Medicare HMO | Source: Intra-hospital | Attending: Physical Medicine & Rehabilitation | Admitting: Physical Medicine & Rehabilitation

## 2017-12-20 ENCOUNTER — Other Ambulatory Visit: Payer: Self-pay

## 2017-12-20 DIAGNOSIS — D696 Thrombocytopenia, unspecified: Secondary | ICD-10-CM | POA: Diagnosis present

## 2017-12-20 DIAGNOSIS — Z298 Encounter for other specified prophylactic measures: Secondary | ICD-10-CM | POA: Diagnosis not present

## 2017-12-20 DIAGNOSIS — S065X9A Traumatic subdural hemorrhage with loss of consciousness of unspecified duration, initial encounter: Secondary | ICD-10-CM | POA: Diagnosis not present

## 2017-12-20 DIAGNOSIS — S065X9D Traumatic subdural hemorrhage with loss of consciousness of unspecified duration, subsequent encounter: Principal | ICD-10-CM

## 2017-12-20 DIAGNOSIS — Z7982 Long term (current) use of aspirin: Secondary | ICD-10-CM

## 2017-12-20 DIAGNOSIS — D62 Acute posthemorrhagic anemia: Secondary | ICD-10-CM | POA: Diagnosis not present

## 2017-12-20 DIAGNOSIS — Z7984 Long term (current) use of oral hypoglycemic drugs: Secondary | ICD-10-CM | POA: Diagnosis not present

## 2017-12-20 DIAGNOSIS — S12131D Unspecified traumatic nondisplaced spondylolisthesis of second cervical vertebra, subsequent encounter for fracture with routine healing: Secondary | ICD-10-CM | POA: Diagnosis not present

## 2017-12-20 DIAGNOSIS — Z981 Arthrodesis status: Secondary | ICD-10-CM | POA: Diagnosis not present

## 2017-12-20 DIAGNOSIS — S42291D Other displaced fracture of upper end of right humerus, subsequent encounter for fracture with routine healing: Secondary | ICD-10-CM

## 2017-12-20 DIAGNOSIS — Z96651 Presence of right artificial knee joint: Secondary | ICD-10-CM | POA: Diagnosis present

## 2017-12-20 DIAGNOSIS — S12301A Unspecified nondisplaced fracture of fourth cervical vertebra, initial encounter for closed fracture: Secondary | ICD-10-CM | POA: Diagnosis not present

## 2017-12-20 DIAGNOSIS — S12201A Unspecified nondisplaced fracture of third cervical vertebra, initial encounter for closed fracture: Secondary | ICD-10-CM | POA: Diagnosis not present

## 2017-12-20 DIAGNOSIS — G4733 Obstructive sleep apnea (adult) (pediatric): Secondary | ICD-10-CM | POA: Diagnosis present

## 2017-12-20 DIAGNOSIS — R7989 Other specified abnormal findings of blood chemistry: Secondary | ICD-10-CM

## 2017-12-20 DIAGNOSIS — T1490XA Injury, unspecified, initial encounter: Secondary | ICD-10-CM | POA: Diagnosis present

## 2017-12-20 DIAGNOSIS — J449 Chronic obstructive pulmonary disease, unspecified: Secondary | ICD-10-CM | POA: Diagnosis present

## 2017-12-20 DIAGNOSIS — S42301A Unspecified fracture of shaft of humerus, right arm, initial encounter for closed fracture: Secondary | ICD-10-CM | POA: Diagnosis not present

## 2017-12-20 DIAGNOSIS — Z7952 Long term (current) use of systemic steroids: Secondary | ICD-10-CM | POA: Diagnosis not present

## 2017-12-20 DIAGNOSIS — Z79899 Other long term (current) drug therapy: Secondary | ICD-10-CM

## 2017-12-20 DIAGNOSIS — Z9889 Other specified postprocedural states: Secondary | ICD-10-CM

## 2017-12-20 DIAGNOSIS — S069X9A Unspecified intracranial injury with loss of consciousness of unspecified duration, initial encounter: Secondary | ICD-10-CM | POA: Diagnosis present

## 2017-12-20 DIAGNOSIS — Z885 Allergy status to narcotic agent status: Secondary | ICD-10-CM | POA: Diagnosis not present

## 2017-12-20 DIAGNOSIS — Z888 Allergy status to other drugs, medicaments and biological substances status: Secondary | ICD-10-CM

## 2017-12-20 DIAGNOSIS — S12301D Unspecified nondisplaced fracture of fourth cervical vertebra, subsequent encounter for fracture with routine healing: Secondary | ICD-10-CM | POA: Diagnosis not present

## 2017-12-20 DIAGNOSIS — L4050 Arthropathic psoriasis, unspecified: Secondary | ICD-10-CM | POA: Diagnosis present

## 2017-12-20 DIAGNOSIS — S12101A Unspecified nondisplaced fracture of second cervical vertebra, initial encounter for closed fracture: Secondary | ICD-10-CM | POA: Diagnosis not present

## 2017-12-20 DIAGNOSIS — S12201D Unspecified nondisplaced fracture of third cervical vertebra, subsequent encounter for fracture with routine healing: Secondary | ICD-10-CM | POA: Diagnosis not present

## 2017-12-20 DIAGNOSIS — N19 Unspecified kidney failure: Secondary | ICD-10-CM

## 2017-12-20 DIAGNOSIS — E46 Unspecified protein-calorie malnutrition: Secondary | ICD-10-CM | POA: Diagnosis not present

## 2017-12-20 DIAGNOSIS — S069X9S Unspecified intracranial injury with loss of consciousness of unspecified duration, sequela: Secondary | ICD-10-CM

## 2017-12-20 DIAGNOSIS — S42296S Other nondisplaced fracture of upper end of unspecified humerus, sequela: Secondary | ICD-10-CM | POA: Diagnosis not present

## 2017-12-20 DIAGNOSIS — R52 Pain, unspecified: Secondary | ICD-10-CM

## 2017-12-20 DIAGNOSIS — S42201A Unspecified fracture of upper end of right humerus, initial encounter for closed fracture: Secondary | ICD-10-CM | POA: Diagnosis not present

## 2017-12-20 DIAGNOSIS — S32020A Wedge compression fracture of second lumbar vertebra, initial encounter for closed fracture: Secondary | ICD-10-CM | POA: Diagnosis not present

## 2017-12-20 DIAGNOSIS — Z86718 Personal history of other venous thrombosis and embolism: Secondary | ICD-10-CM

## 2017-12-20 DIAGNOSIS — S065XAA Traumatic subdural hemorrhage with loss of consciousness status unknown, initial encounter: Secondary | ICD-10-CM

## 2017-12-20 DIAGNOSIS — E119 Type 2 diabetes mellitus without complications: Secondary | ICD-10-CM | POA: Diagnosis present

## 2017-12-20 DIAGNOSIS — I10 Essential (primary) hypertension: Secondary | ICD-10-CM | POA: Diagnosis not present

## 2017-12-20 DIAGNOSIS — K5901 Slow transit constipation: Secondary | ICD-10-CM | POA: Diagnosis present

## 2017-12-20 DIAGNOSIS — R0781 Pleurodynia: Secondary | ICD-10-CM | POA: Diagnosis not present

## 2017-12-20 DIAGNOSIS — T07XXXA Unspecified multiple injuries, initial encounter: Secondary | ICD-10-CM | POA: Diagnosis not present

## 2017-12-20 LAB — CBC
HCT: 22 % — ABNORMAL LOW (ref 39.0–52.0)
HEMOGLOBIN: 7.3 g/dL — AB (ref 13.0–17.0)
MCH: 29 pg (ref 26.0–34.0)
MCHC: 33.2 g/dL (ref 30.0–36.0)
MCV: 87.3 fL (ref 78.0–100.0)
PLATELETS: 146 10*3/uL — AB (ref 150–400)
RBC: 2.52 MIL/uL — ABNORMAL LOW (ref 4.22–5.81)
RDW: 14.4 % (ref 11.5–15.5)
WBC: 5 10*3/uL (ref 4.0–10.5)

## 2017-12-20 LAB — GLUCOSE, CAPILLARY
GLUCOSE-CAPILLARY: 102 mg/dL — AB (ref 65–99)
GLUCOSE-CAPILLARY: 102 mg/dL — AB (ref 65–99)
GLUCOSE-CAPILLARY: 106 mg/dL — AB (ref 65–99)

## 2017-12-20 MED ORDER — PROCHLORPERAZINE 25 MG RE SUPP
12.5000 mg | Freq: Four times a day (QID) | RECTAL | Status: DC | PRN
Start: 2017-12-20 — End: 2018-01-02

## 2017-12-20 MED ORDER — INSULIN ASPART 100 UNIT/ML ~~LOC~~ SOLN: 0.0000 [IU] | Freq: Every day | SUBCUTANEOUS | Status: DC

## 2017-12-20 MED ORDER — IPRATROPIUM-ALBUTEROL 0.5-2.5 (3) MG/3ML IN SOLN
3.0000 mL | Freq: Two times a day (BID) | RESPIRATORY_TRACT | Status: DC
  Administered 2017-12-20 – 2017-12-30 (×15): 3 mL via RESPIRATORY_TRACT
  Filled 2017-12-20 (×20): qty 3

## 2017-12-20 MED ORDER — ENOXAPARIN SODIUM 40 MG/0.4ML ~~LOC~~ SOLN
40.0000 mg | SUBCUTANEOUS | Status: DC
  Administered 2017-12-21 – 2018-01-02 (×13): 40 mg via SUBCUTANEOUS
  Filled 2017-12-20 (×13): qty 0.4

## 2017-12-20 MED ORDER — METHOCARBAMOL 500 MG PO TABS
500.0000 mg | ORAL_TABLET | Freq: Four times a day (QID) | ORAL | Status: DC | PRN
  Administered 2017-12-20 – 2017-12-26 (×15): 500 mg via ORAL
  Filled 2017-12-20 (×15): qty 1

## 2017-12-20 MED ORDER — PROCHLORPERAZINE EDISYLATE 10 MG/2ML IJ SOLN: 5.0000 mg | Freq: Four times a day (QID) | INTRAMUSCULAR | Status: DC | PRN

## 2017-12-20 MED ORDER — IPRATROPIUM-ALBUTEROL 20-100 MCG/ACT IN AERS: 1.0000 | INHALATION_SPRAY | Freq: Two times a day (BID) | RESPIRATORY_TRACT | Status: DC

## 2017-12-20 MED ORDER — FLEET ENEMA 7-19 GM/118ML RE ENEM: 1.0000 | ENEMA | Freq: Once | RECTAL | Status: DC | PRN

## 2017-12-20 MED ORDER — INSULIN ASPART 100 UNIT/ML ~~LOC~~ SOLN
0.0000 [IU] | Freq: Three times a day (TID) | SUBCUTANEOUS | Status: DC
  Administered 2017-12-25 – 2017-12-30 (×3): 1 [IU] via SUBCUTANEOUS

## 2017-12-20 MED ORDER — METFORMIN HCL 500 MG PO TABS
500.0000 mg | ORAL_TABLET | Freq: Two times a day (BID) | ORAL | Status: DC
  Administered 2017-12-20 – 2018-01-02 (×26): 500 mg via ORAL
  Filled 2017-12-20 (×27): qty 1

## 2017-12-20 MED ORDER — ZINC SULFATE 220 (50 ZN) MG PO CAPS
220.0000 mg | ORAL_CAPSULE | Freq: Every day | ORAL | Status: DC
  Administered 2017-12-21 – 2018-01-02 (×13): 220 mg via ORAL
  Filled 2017-12-20 (×14): qty 1

## 2017-12-20 MED ORDER — DIPHENHYDRAMINE HCL 12.5 MG/5ML PO ELIX: 12.5000 mg | ORAL_SOLUTION | Freq: Four times a day (QID) | ORAL | Status: DC | PRN

## 2017-12-20 MED ORDER — PROCHLORPERAZINE MALEATE 5 MG PO TABS: 5.0000 mg | ORAL_TABLET | Freq: Four times a day (QID) | ORAL | Status: DC | PRN

## 2017-12-20 MED ORDER — BISACODYL 10 MG RE SUPP
10.0000 mg | Freq: Once | RECTAL | Status: AC
  Administered 2017-12-20: 10 mg via RECTAL
  Filled 2017-12-20: qty 1

## 2017-12-20 MED ORDER — ALBUTEROL SULFATE (2.5 MG/3ML) 0.083% IN NEBU
2.5000 mg | INHALATION_SOLUTION | RESPIRATORY_TRACT | Status: DC | PRN
  Administered 2017-12-31: 2.5 mg via RESPIRATORY_TRACT
  Filled 2017-12-20: qty 3

## 2017-12-20 MED ORDER — LEVETIRACETAM 500 MG PO TABS
500.0000 mg | ORAL_TABLET | Freq: Two times a day (BID) | ORAL | Status: DC
  Administered 2017-12-20 – 2018-01-02 (×26): 500 mg via ORAL
  Filled 2017-12-20 (×26): qty 1

## 2017-12-20 MED ORDER — ACETAMINOPHEN 325 MG PO TABS
325.0000 mg | ORAL_TABLET | ORAL | Status: DC | PRN
  Administered 2017-12-20 – 2018-01-02 (×37): 650 mg via ORAL
  Filled 2017-12-20 (×7): qty 2
  Filled 2017-12-20: qty 1
  Filled 2017-12-20 (×30): qty 2

## 2017-12-20 MED ORDER — ONDANSETRON HCL 4 MG/2ML IJ SOLN: 4.0000 mg | Freq: Four times a day (QID) | INTRAMUSCULAR | Status: DC | PRN

## 2017-12-20 MED ORDER — PANTOPRAZOLE SODIUM 40 MG PO TBEC
40.0000 mg | DELAYED_RELEASE_TABLET | ORAL | Status: DC
  Administered 2017-12-21 – 2018-01-01 (×12): 40 mg via ORAL
  Filled 2017-12-20 (×12): qty 1

## 2017-12-20 MED ORDER — ONDANSETRON 4 MG PO TBDP
4.0000 mg | ORAL_TABLET | Freq: Four times a day (QID) | ORAL | Status: DC | PRN
  Filled 2017-12-20: qty 1

## 2017-12-20 MED ORDER — GUAIFENESIN-DM 100-10 MG/5ML PO SYRP: 5.0000 mL | ORAL_SOLUTION | Freq: Four times a day (QID) | ORAL | Status: DC | PRN

## 2017-12-20 MED ORDER — POLYETHYLENE GLYCOL 3350 17 G PO PACK: 17.0000 g | PACK | Freq: Every day | ORAL | Status: DC | PRN

## 2017-12-20 MED ORDER — LOSARTAN POTASSIUM 50 MG PO TABS
50.0000 mg | ORAL_TABLET | ORAL | Status: DC
  Administered 2017-12-21 – 2017-12-31 (×7): 50 mg via ORAL
  Filled 2017-12-20 (×7): qty 1

## 2017-12-20 MED ORDER — TAB-A-VITE/IRON PO TABS
1.0000 | ORAL_TABLET | Freq: Every day | ORAL | Status: DC
  Administered 2017-12-21 – 2018-01-02 (×13): 1 via ORAL
  Filled 2017-12-20 (×13): qty 1

## 2017-12-20 MED ORDER — BISACODYL 10 MG RE SUPP
10.0000 mg | Freq: Every day | RECTAL | Status: DC | PRN
  Administered 2017-12-25: 10 mg via RECTAL
  Filled 2017-12-20: qty 1

## 2017-12-20 MED ORDER — TAB-A-VITE/IRON PO TABS
1.0000 | ORAL_TABLET | Freq: Every day | ORAL | Status: DC
  Administered 2017-12-20: 1 via ORAL
  Filled 2017-12-20 (×2): qty 1

## 2017-12-20 MED ORDER — ALUM & MAG HYDROXIDE-SIMETH 200-200-20 MG/5ML PO SUSP: 30.0000 mL | ORAL | Status: DC | PRN

## 2017-12-20 MED ORDER — POLYETHYLENE GLYCOL 3350 17 G PO PACK
17.0000 g | PACK | Freq: Every day | ORAL | Status: DC
  Administered 2017-12-21 – 2017-12-29 (×8): 17 g via ORAL
  Filled 2017-12-20 (×12): qty 1

## 2017-12-20 MED ORDER — TRAZODONE HCL 50 MG PO TABS: 25.0000 mg | ORAL_TABLET | Freq: Every evening | ORAL | Status: DC | PRN

## 2017-12-20 MED ORDER — METOPROLOL SUCCINATE ER 25 MG PO TB24
25.0000 mg | ORAL_TABLET | ORAL | Status: DC
  Administered 2017-12-22 – 2018-01-01 (×5): 25 mg via ORAL
  Filled 2017-12-20 (×5): qty 1
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
## 2017-12-20 NOTE — Progress Notes (Signed)
Report given to Chelsea on 4W, pt taken to 4W12. Wife at bedside with all belongings.  No IV (discontinued yesterday).  Vitals stable at this time.  
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
## 2017-12-20 NOTE — Progress Notes (Signed)
SLP Cancellation Note  Patient Details Name: [name] [name] MRN: [id number] DOB: 03/29/1946   Cancelled treatment:       Reason Eval/Treat Not Completed: Other (comment). Pt to CIR today, will defer cognitive assessment to that venue.    Serai Tukes, Riley NearingBonnie Caroline 12/20/2017, 2:45 PM
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
## 2017-12-20 NOTE — Progress Notes (Signed)
Inpatient Rehabilitation Admissions Coordinator  I have insurance approval to admit pt to inpt rehab today and pt and wife are in agreement. I contacted Tresa EndoKelly with Trauma and she will arrange d/c. RN CM and SW made aware. I will make the arrangements to admit today.  Ottie GlazierBarbara Ubah Radke, RN, MSN Rehab Admissions Coordinator [phone] 12/20/2017 1:52 PM
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
## 2017-12-20 NOTE — Progress Notes (Signed)
OT Cancellation Note  Patient Details Name: [name] [name] MRN: [id number] DOB: [birth year]   Cancelled Treatment:    Reason Eval/Treat Not Completed: Patient declined, no reason specified Pt fatigued from sitting this AM on first attempt. Second attempt pt just awaken to eat lunch and actively attempting self feeding supine. Wife present at this time. Both pt and wife are excited for the opportunity to have admission to CIR.   Felecia ShellingJones, Kenadi Miltner B   Sundi Slevin, Brynn   OTR/L Pager: [phone] Office: [phone] .  12/20/2017, 2:51 PM
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
## 2017-12-20 NOTE — Progress Notes (Signed)
Subjective: 2 Days Post-Op Procedure(s) (LRB): OPEN REDUCTION INTERNAL FIXATION (ORIF) PROXIMAL HUMERUS FRACTURE (Right) Patient reports pain as mild.  Doing ok this am.  Has been compliant in sling.  Objective: Vital signs in last 24 hours: Temp:  [97.9 F (36.6 C)-99.2 F (37.3 C)] 98.7 F (37.1 C) (06/12 0300) Pulse Rate:  [83-101] 90 (06/12 0400) Resp:  [15-31] 15 (06/12 0400) BP: (122-135)/(60-83) 122/83 (06/12 0400) SpO2:  [94 %-99 %] 99 % (06/12 0400)  Intake/Output from previous day: 06/11 0701 - 06/12 0700 In: 360 [P.O.:360] Out: 1100 [Urine:1100] Intake/Output this shift: No intake/output data recorded.  Recent Labs    12/18/17 0858 12/19/17 0850 12/20/17 0351  HGB 7.9* 7.3* 7.3*   Recent Labs    12/19/17 0850 12/20/17 0351  WBC 6.1 5.0  RBC 2.49* 2.52*  HCT 21.6* 22.0*  PLT 110* 146*   Recent Labs    12/18/17 0858 12/19/17 0850  NA 143 141  K 3.8 4.2  CL 113* 112*  CO2 22 21*  BUN 14 22*  CREATININE 1.01 0.99  GLUCOSE 91 107*  CALCIUM 7.9* 7.9*   No results for input(s): LABPT, INR in the last 72 hours.  Neurologically intact Neurovascular intact Sensation intact distally Intact pulses distally Dorsiflexion/Plantar flexion intact Incision: scant drainage No cellulitis present Compartment soft  Swelling much better to right hand     Assessment/Plan: 2 Days Post-Op Procedure(s) (LRB): OPEN REDUCTION INTERNAL FIXATION (ORIF) PROXIMAL HUMERUS FRACTURE (Right) Up with therapy  RUE-NWB.  Sling at all times.  Elevate for swelling. Elbow and wrist ROM as tolerated. Ok to start pendulum swings one week post-op.    Cristie HemMary L Blaze Sandin 12/20/2017, 8:07 AM

## 2017-12-21 ENCOUNTER — Inpatient Hospital Stay (HOSPITAL_COMMUNITY): Payer: Medicare HMO | Admitting: Physical Therapy

## 2017-12-21 ENCOUNTER — Inpatient Hospital Stay (HOSPITAL_COMMUNITY): Payer: Medicare HMO | Admitting: Speech Pathology

## 2017-12-21 ENCOUNTER — Inpatient Hospital Stay (HOSPITAL_COMMUNITY): Payer: Medicare HMO

## 2017-12-21 DIAGNOSIS — S42296S Other nondisplaced fracture of upper end of unspecified humerus, sequela: Secondary | ICD-10-CM

## 2017-12-21 DIAGNOSIS — I10 Essential (primary) hypertension: Secondary | ICD-10-CM

## 2017-12-21 DIAGNOSIS — K5901 Slow transit constipation: Secondary | ICD-10-CM

## 2017-12-21 DIAGNOSIS — L4050 Arthropathic psoriasis, unspecified: Secondary | ICD-10-CM

## 2017-12-21 DIAGNOSIS — Z298 Encounter for other specified prophylactic measures: Secondary | ICD-10-CM

## 2017-12-21 LAB — CBC WITH DIFFERENTIAL/PLATELET
Abs Immature Granulocytes: 0.1 10*3/uL (ref 0.0–0.1)
BASOS ABS: 0 10*3/uL (ref 0.0–0.1)
BASOS PCT: 1 %
Eosinophils Absolute: 0.2 10*3/uL (ref 0.0–0.7)
Eosinophils Relative: 3 %
HCT: 25.8 % — ABNORMAL LOW (ref 39.0–52.0)
HEMOGLOBIN: 8.3 g/dL — AB (ref 13.0–17.0)
Immature Granulocytes: 1 %
LYMPHS PCT: 18 %
Lymphs Abs: 1.2 10*3/uL (ref 0.7–4.0)
MCH: 28.8 pg (ref 26.0–34.0)
MCHC: 32.2 g/dL (ref 30.0–36.0)
MCV: 89.6 fL (ref 78.0–100.0)
MONO ABS: 0.7 10*3/uL (ref 0.1–1.0)
Monocytes Relative: 10 %
Neutro Abs: 4.5 10*3/uL (ref 1.7–7.7)
Neutrophils Relative %: 67 %
Platelets: 188 10*3/uL (ref 150–400)
RBC: 2.88 MIL/uL — ABNORMAL LOW (ref 4.22–5.81)
RDW: 14.7 % (ref 11.5–15.5)
WBC: 6.7 10*3/uL (ref 4.0–10.5)

## 2017-12-21 LAB — COMPREHENSIVE METABOLIC PANEL
ALK PHOS: 52 U/L (ref 38–126)
ALT: 41 U/L (ref 17–63)
ANION GAP: 12 (ref 5–15)
AST: 41 U/L (ref 15–41)
Albumin: 3.3 g/dL — ABNORMAL LOW (ref 3.5–5.0)
BUN: 21 mg/dL — ABNORMAL HIGH (ref 6–20)
CALCIUM: 8.6 mg/dL — AB (ref 8.9–10.3)
CO2: 25 mmol/L (ref 22–32)
Chloride: 105 mmol/L (ref 101–111)
Creatinine, Ser: 1.01 mg/dL (ref 0.61–1.24)
GFR calc Af Amer: >60 mL/min (ref >60)
Glucose, Bld: 124 mg/dL — ABNORMAL HIGH (ref 65–99)
Potassium: 3.9 mmol/L (ref 3.5–5.1)
SODIUM: 142 mmol/L (ref 135–145)
TOTAL PROTEIN: 5.7 g/dL — AB (ref 6.5–8.1)
Total Bilirubin: 1.8 mg/dL — ABNORMAL HIGH (ref 0.3–1.2)

## 2017-12-21 LAB — GLUCOSE, CAPILLARY
GLUCOSE-CAPILLARY: 111 mg/dL — AB (ref 65–99)
GLUCOSE-CAPILLARY: 106 mg/dL — AB (ref 65–99)
Glucose-Capillary: 112 mg/dL — ABNORMAL HIGH (ref 65–99)
Glucose-Capillary: 114 mg/dL — ABNORMAL HIGH (ref 65–99)

## 2017-12-21 MED ORDER — PREMIER PROTEIN SHAKE
2.0000 [oz_av] | Freq: Three times a day (TID) | ORAL | Status: DC
  Administered 2017-12-21 – 2017-12-26 (×12): 2 [oz_av] via ORAL
  Filled 2017-12-21 (×27): qty 325.31
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
## 2017-12-21 NOTE — Progress Notes (Signed)
Patient information reviewed and entered into eRehab system by Ellouise Mcwhirter, RN, CRRN, PPS Coordinator.  Information including medical coding and functional independence measure will be reviewed and updated through discharge.     Per nursing patient was given "Data Collection Information Summary for Patients in Inpatient Rehabilitation Facilities with attached "Privacy Act Statement-Health Care Records" upon admission.  

## 2017-12-22 ENCOUNTER — Inpatient Hospital Stay (HOSPITAL_COMMUNITY): Payer: Medicare HMO | Admitting: Physical Therapy

## 2017-12-22 ENCOUNTER — Inpatient Hospital Stay (HOSPITAL_COMMUNITY): Payer: Medicare HMO | Admitting: Occupational Therapy

## 2017-12-22 DIAGNOSIS — E46 Unspecified protein-calorie malnutrition: Secondary | ICD-10-CM

## 2017-12-22 LAB — GLUCOSE, CAPILLARY
GLUCOSE-CAPILLARY: 99 mg/dL (ref 65–99)
GLUCOSE-CAPILLARY: 123 mg/dL — AB (ref 65–99)
Glucose-Capillary: 85 mg/dL (ref 65–99)
Glucose-Capillary: 116 mg/dL — ABNORMAL HIGH (ref 65–99)

## 2017-12-22 MED ORDER — MUSCLE RUB 10-15 % EX CREA
TOPICAL_CREAM | Freq: Three times a day (TID) | CUTANEOUS | Status: DC
  Administered 2017-12-22 – 2018-01-02 (×27): via TOPICAL
  Filled 2017-12-22: qty 85

## 2017-12-22 MED ORDER — TROLAMINE SALICYLATE 10 % EX CREA
TOPICAL_CREAM | Freq: Three times a day (TID) | CUTANEOUS | Status: DC
  Filled 2017-12-22 (×3): qty 85

## 2017-12-22 NOTE — Plan of Care (Signed)
  Problem: Consults Goal: RH GENERAL PATIENT EDUCATION Description See Patient Education module for education specifics. Outcome: Progressing   Problem: RH BOWEL ELIMINATION Goal: RH STG MANAGE BOWEL WITH ASSISTANCE Description STG Manage Bowel with Mod I Assistance.  Outcome: Progressing   Problem: RH SKIN INTEGRITY Goal: RH STG SKIN FREE OF INFECTION/BREAKDOWN Description No new breakdown with mod I assist   Outcome: Progressing Goal: RH STG ABLE TO PERFORM INCISION/WOUND CARE W/ASSISTANCE Description STG Able To Perform Incision/Wound Care With Mod I Assistance.  Outcome: Progressing   Problem: RH SAFETY Goal: RH STG ADHERE TO SAFETY PRECAUTIONS W/ASSISTANCE/DEVICE Description STG Adhere to Safety Precautions With Mod I Assistance/Device.  Outcome: Progressing   Problem: RH PAIN MANAGEMENT Goal: RH STG PAIN MANAGED AT OR BELOW PT'S PAIN GOAL Description <3 out 10.   Outcome: Progressing   

## 2017-12-22 NOTE — Progress Notes (Signed)
Physical Therapy Session Note  Patient Details  Name: [name] [name] MRN: [id number] Date of Birth: Jan 14, 1946  Today's Date: 12/22/2017 PT Individual Time: 1605-1700 PT Individual Time Calculation (min): 55 min   Short Term Goals: Week 1:  PT Short Term Goal 1 (Week 1): =LTG   Skilled Therapeutic Interventions/Progress Updates:   Pt received sitting in WC and agreeable to PT. Pt transported to rehab gym in [facility]. PT instructed pt in Berg balance test. Patient demonstrates increased fall risk as noted by score of   30/56 on Berg Balance Scale.  (<36= high risk for falls, close to 100%; 37-45 significant >80%; 46-51 moderate >50%; 52-55 lower >25%) see below for details.   Gait training without AD x 161f with intermittent min-supervision assist from PT and min cues for increased step length and width to normalize gait pattern and improve safety. No sx of orthostasis reported to PT throughout treatment.   WC mobility x 1548fwith BLE and LUE propulsion and supervision assist from PT. Moderate verbal cues for doorway management and safety in turns. [facility] returned to [facility] and [facility] and all needs met.            Therapy Documentation Precautions:  Precautions Precautions: Fall, Cervical Type of Shoulder Precautions: in shoulder immobilizer for 6 weeks, pendulum ex to start after 6/17 Shoulder Interventions: Shoulder sling/immobilizer, At all times Required Braces or Orthoses: Cervical Brace, Sling Cervical Brace: Hard collar, At all times Restrictions Weight Bearing Restrictions: Yes RUE Weight Bearing: Non weight bearing Other Position/Activity Restrictions: R UE  Balance: Standardized Balance Assessment Standardized Balance Assessment: Berg Balance Test;Functional Gait Assessment Berg Balance Test Sit to Stand: Needs minimal aid to stand or to stabilize Standing Unsupported: Able to stand 2 minutes with supervision Sitting with Back  Unsupported but Feet Supported on Floor or Stool: Able to sit safely and securely 2 minutes Stand to Sit: Controls descent by using hands Transfers: Needs one person to assist Standing Unsupported with Eyes Closed: Able to stand 10 seconds with supervision Standing Ubsupported with Feet Together: Able to place feet together independently and stand for 1 minute with supervision From Standing, Reach Forward with Outstretched Arm: Can reach forward >5 cm safely (2") From Standing Position, Pick up Object from Floor: Unable to try/needs assist to keep balance From Standing Position, Turn to Look Behind Over each Shoulder: Turn sideways only but maintains balance Turn 360 Degrees: Needs close supervision or verbal cueing Standing Unsupported, Alternately Place Feet on Step/Stool: Able to stand independently and complete 8 steps >20 seconds Standing Unsupported, One Foot in Front: Able to take small step independently and hold 30 seconds Standing on One Leg: Able to lift leg independently and hold equal to or more than 3 seconds Total Score: 30   See Function Navigator for Current Functional Status.   Therapy/Group: Individual Therapy  AuLorie Phenix/14/2019, 5:05 PM

## 2017-12-22 NOTE — Progress Notes (Signed)
Physical Therapy Session Note  Patient Details  Name: [name] [name] MRN: [id number] Date of Birth: 03-Nov-1945  Today's Date: 12/22/2017 PT Individual Time: 0830-0925 PT Individual Time Calculation (min): 55 min   Short Term Goals: Week 1:  PT Short Term Goal 1 (Week 1): =LTG  Skilled Therapeutic Interventions/Progress Updates:    Pt seated edge of bed, agreeable to therapy. States he has more soreness this morning than yesterday. Pt performs sit to stand and stand pivot transfers throughout session with min A, increased time.  nustep for LE strengthening x 8 minutes level 3.  Gait without AD with min A 2 x 100' with slow cadence, steadying assist during turns.  Seated hamstring and hip IR/ER stretching to reduce back pain, pt tolerated well.  Pt left seated in w/c with wife present, needs at hand, ice on Lt shoulder and hot back on low back.  Therapy Documentation Precautions:  Precautions Precautions: Fall, Cervical Type of Shoulder Precautions: in shoulder immobilizer for 6 weeks, pendulum ex to start after 6/17 Shoulder Interventions: Shoulder sling/immobilizer, At all times Required Braces or Orthoses: Cervical Brace, Sling Cervical Brace: Hard collar, At all times Restrictions Weight Bearing Restrictions: Yes RUE Weight Bearing: Non weight bearing Other Position/Activity Restrictions: R UE Pain: Pain Assessment Pain Scale: 0-10 Pain Score: 6 in back, neck and shoulder.  Ice applied to shoulder and heat to back after session   Therapy/Group: Individual Therapy  [name] [name] 12/22/2017, 9:26 AM

## 2017-12-22 NOTE — Progress Notes (Signed)
Occupational Therapy Session Note  Patient Details  Name: [name] [name] MRN: [id number] Date of Birth: August 20, 1945  Today's Date: 12/22/2017 OT Individual Time: [id number] OT Individual Time Calculation (min): 90 min   Short Term Goals: Week 1:  OT Short Term Goal 1 (Week 1): STG=LTG d/t ELOS   Skilled Therapeutic Interventions/Progress Updates:    Pt greeted supine in bed with spouse Vicky present. Reporting increased Lt flank pain due to muscle spasms. Agreeable to complete B/D EOB. Therapist provided MHP to flank to address pain and RN in to provide muscle rub ointment. Tx focus on sit<stands, balance, precaution adherence, and adaptive bathing/dressing skills. Pt required Max A for LB due to inability to achieve seated figure 4. Bending over was also painful. Sit<stand completed with Mod A and pt able to complete pericare himself. Sling was removed during seated tasks, but reapplied prior to standing. Spouse had hands on practice and education with donning/doffing sling during session. Educated pt and spouse on adaptive 1 handed techniques during UB/LB dressing, with pt ultimately requiring Max A at this time due to precautions/pain. He opted to remove clothing at end of session for comfort. Assisted him with doffing clothing and donning clean hospital gowns. Pt completed stand pivot<w/c without AD and Mod A. He was left with spouse and all needs.   Therapy Documentation Precautions:  Precautions Precautions: Fall, Cervical Type of Shoulder Precautions: in shoulder immobilizer for 6 weeks, pendulum ex to start after 6/17 Shoulder Interventions: Shoulder sling/immobilizer, At all times Required Braces or Orthoses: Cervical Brace, Sling Cervical Brace: Hard collar, At all times Restrictions Weight Bearing Restrictions: Yes RUE Weight Bearing: Non weight bearing Other Position/Activity Restrictions: R UE   ADL: ADL ADL Comments: Please see functional navigator     See Function  Navigator for Current Functional Status.   Therapy/Group: Individual Therapy  [name] [name] 12/22/2017, 4:12 PM

## 2017-12-22 NOTE — Progress Notes (Signed)
[facility] PHYSICAL MEDICINE & REHABILITATION     PROGRESS NOTE    Subjective/Complaints: No new issues. Up with Therapy early this morning  ROS: Patient denies fever, rash, sore throat, blurred vision, nausea, vomiting, diarrhea, cough, shortness of breath or chest pain, joint or back pain, headache, or mood change.   Objective:  No results found. Recent Labs    12/20/17 0351 12/21/17 0711  WBC 5.0 6.7  HGB 7.3* 8.3*  HCT 22.0* 25.8*  PLT 146* 188   Recent Labs    12/21/17 0711  NA 142  K 3.9  CL 105  GLUCOSE 124*  BUN 21*  CREATININE 1.01  CALCIUM 8.6*   CBG (last 3)  Recent Labs    12/21/17 1636 12/21/17 2038 12/22/17 0654  GLUCAP 106* 114* 99    Wt Readings from Last 3 Encounters:  12/20/17 108.8 kg (239 lb 13.8 oz)  01/24/17 64.4 kg (142 lb)  01/19/17 64.7 kg (142 lb 9.6 oz)     Intake/Output Summary (Last 24 hours) at 12/22/2017 1100 Last data filed at 12/22/2017 0843 Gross per 24 hour  Intake 460 ml  Output 225 ml  Net 235 ml    Vital Signs: Blood pressure 139/73, pulse 92, temperature 98.1 F (36.7 C), resp. rate 17, height 5\' 7"  (1.702 m), weight 108.8 kg (239 lb 13.8 oz), SpO2 98 %. Physical Exam:   Constitutional: No distress . Vital signs reviewed. HEENT: EOMI, oral membranes moist Neck: supple Cardiovascular: RRR without murmur. No JVD    Respiratory: CTA Bilaterally without wheezes or rales. Normal effort    GI: BS +, non-tender, non-distended   Musculoskeletal:  RUE dressed, in sling. Compressive dressing right forearm--finger with min edema.  Neurological: He isalertand oriented to person, place, and time. Motor: left upper extremity: 4+/5 proximal to distal Right upper extremity: Shoulder abduction 1/5, elbow brace, and grip 3/5 Left lower extremity: Hip flexion 4-/5, knee extension 4/5, ankle dorsiflexion 4+/5 Sensation intact to light touch.motor and sensory exam stable---no changes.  Remains alert and appropriate.  Skin:   As abovwe  Psychiatric: He has a normal mood and affect. His behavior is normal.        Assessment/Plan: 1. 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 Physiatrist is providing close team supervision and 24 hour management of active medical problems listed below. Physiatrist and rehab team continue to assess barriers to discharge/monitor patient progress toward functional and medical goals.  Function:  Bathing Bathing position   Position: Wheelchair/chair at sink  Bathing parts Body parts bathed by patient: Chest, Abdomen, Front perineal area, Right upper leg, Left upper leg Body parts bathed by helper: Buttocks, Back, Right lower leg, Left lower leg, Left arm  Bathing assist Assist Level: Touching or steadying assistance(Pt > 75%)      Upper Body Dressing/Undressing Upper body dressing   What is the patient wearing?: Hospital gown                Upper body assist Assist Level: Touching or steadying assistance(Pt > 75%)      Lower Body Dressing/Undressing Lower body dressing   What is the patient wearing?: Socks, Ted Hose                           TED Hose - Performed by helper: Don/doff right TED hose, Don/doff left TED hose  Lower body  assist Assist for lower body dressing: Touching or steadying assistance (Pt > 75%)      Toileting Toileting Toileting activity did not occur: No continent bowel/bladder event   Toileting steps completed by helper: Adjust clothing prior to toileting, Performs perineal hygiene, Adjust clothing after toileting Toileting Assistive Devices: Grab bar or rail  Toileting assist Assist level: Touching or steadying assistance (Pt.75%)   Transfers Chair/bed transfer   Chair/bed transfer method: Ambulatory Chair/bed transfer assist level: Moderate assist (Pt 50 - 74%/lift or lower)        Locomotion Ambulation     Max distance: 25 Assist level: Touching or steadying assistance (Pt > 75%)   Wheelchair          Cognition Comprehension Comprehension assist level: Follows complex conversation/direction with extra time/assistive device  Expression Expression assist level: Expresses complex ideas: With extra time/assistive device  Social Interaction Social Interaction assist level: Interacts appropriately with others with medication or extra time (anti-anxiety, antidepressant).  Problem Solving Problem solving assist level: Solves complex problems: With extra time  Memory Memory assist level: More than reasonable amount of time   Medical Problem List and Plan: 1.  Weakness, limited endurance, limitation in self-care secondary to polytrauma with TBI.  -continue therapies 2.  DVT Prophylaxis/Anticoagulation: Pharmaceutical: Lovenox 3. Pain Management: Managed with tylenol and robaxin 4. Mood: LCSW to follow for evaluation and support.  5. Neuropsych: This patient is capable of making decisions on his own behalf. 6. Skin/Wound Care: routine pressure relief measures 7. Fluids/Electrolytes/Nutrition: encourage PO  -increase intake/proteint    -protein supp added 8.  Seizure prophylaxis: On keppra 9. HTN: Monitor BP bid--continue Cozaar and metoprolol.  10. Constipation: Increase miralax to bid--enema today if suppository not effective.  11. COPD: Resumed Combivent bid. Encourage IS while awake. 12. ABLA:  hgb 8.3 6/13. No signs of blood loss  -recheck monday  13. Right proximal humerus fracture s/p ORIF 6/10: NWB RUE to start pendum exercises next week. 14. OSA?: Untreated. Encouraged patient to follow up with MD for testing after discharge.  15. T2DM: Monitor BS ac/hs. Resumed metformin.   SSI for elevated BS/thigter control to help promote wound healing.   -sugars well controlled 6/14 16. Thrombocytopenia: 188   6/14    LOS (Days) 2 A FACE TO FACE EVALUATION WAS  PERFORMED  Ranelle Oyster, MD 12/22/2017 11:00 AM

## 2017-12-22 NOTE — IPOC Note (Signed)
Overall Plan of Care [facility](IPOC) Patient Details Name: [name] [name] MRN: [id number] DOB: Jun 18, 1946  Admitting Diagnosis: <principal problem not specified>polytrauma, TBI  Hospital Problems: Active Problems:   Trauma   Seizure prophylaxis   Benign essential HTN   Slow transit constipation   PSA (psoriatic arthritis) (HCC)     Functional Problem List: Nursing Bowel, Endurance, Medication Management, Motor, Pain, Safety, Skin Integrity  PT Balance, Endurance, Motor, Pain, Safety  OT Balance, Endurance, Motor, Safety, Pain  SLP    TR         Basic ADL's: OT Toileting, Grooming, Bathing, Dressing     Advanced  ADL's: OT       Transfers: PT Bed Mobility, Bed to Chair, Car, Occupational psychologisturniture  OT Toilet, Research scientist (life sciences)Tub/Shower     Locomotion: PT Stairs, Psychologist, prison and probation servicesWheelchair Mobility, Ambulation     Additional Impairments: OT None  SLP        TR      Anticipated Outcomes Item Anticipated Outcome  Self Feeding I  Swallowing      Basic self-care  (S)  Toileting  (S)   Bathroom Transfers (S)  Bowel/Bladder  Pt will manage bowel and bladder with mod I assist at discharge.   Transfers  supervision  Locomotion  supervision  Communication     Cognition     Pain  Pt will manage pain at 3 or less on a scale of 0-10.   Safety/Judgment  Pt will remain free of falls with injury with min assist/cues while in rehab.    Therapy Plan: PT Intensity: Minimum of 1-2 x/day ,45 to 90 minutes PT Frequency: 5 out of 7 days PT Duration Estimated Length of Stay: 7-10 days OT Intensity: Minimum of 1-2 x/day, 45 to 90 minutes OT Frequency: 5 out of 7 days OT Duration/Estimated Length of Stay: 7-10 days      Team Interventions: Nursing Interventions Patient/Family Education, Medication Management, Bowel Management, Disease Management/Prevention, Skin Care/Wound Management, Pain Management, Discharge Planning, Psychosocial Support  PT interventions Ambulation/gait training, Discharge planning,  Functional mobility training, Therapeutic Activities, Balance/vestibular training, Neuromuscular re-education, Therapeutic Exercise, Wheelchair propulsion/positioning, UE/LE Strength taining/ROM, Splinting/orthotics, Pain management, DME/adaptive equipment instruction, Cognitive remediation/compensation, Community reintegration, Equities traderatient/family education, Museum/gallery curatortair training, UE/LE Coordination activities  OT Interventions Community reintegration, Discharge planning, Disease mangement/prevention, Fish farm managerDME/adaptive equipment instruction, Functional mobility training, Pain management, Patient/family education, Psychosocial support, Self Care/advanced ADL retraining, Therapeutic Activities, Therapeutic Exercise, UE/LE Strength taining/ROM, UE/LE Coordination activities, Warden/rangerBalance/vestibular training  SLP Interventions    TR Interventions    SW/CM Interventions Discharge Planning, Patient/Family Education, Psychosocial Support   Barriers to Discharge MD  Medical stability  Nursing      PT Weight bearing restrictions, Other (comments)(pain)    OT      SLP      SW       Team Discharge Planning: Destination: PT-Home ,OT- Home , SLP-Home Projected Follow-up: PT-Outpatient PT, Home health PT, OT-  None, SLP-None Projected Equipment Needs: PT-To be determined, OT- To be determined, SLP-None recommended by SLP Equipment Details: PT- , OT-Pt to report back type of shower chair he has at home Patient/family involved in discharge planning: PT- Patient, Family Adult nursemember/caregiver,  OT-Patient, Family member/caregiver, SLP-Patient, Family member/caregiver  MD ELOS: 7-10 days Medical Rehab Prognosis:  Excellent Assessment: The patient has been admitted for CIR therapies with the diagnosis of polytrauma, TBI. The team will be addressing functional mobility, strength, stamina, balance, safety, adaptive techniques and equipment, self-care, bowel and bladder mgt, patient and caregiver education, ortho precautions, pain mgt,  ego support. Goals have been set at supervision for mobility and self-care tasks.    Ranelle Oyster, MD, FAAPMR      See Team Conference Notes for weekly updates to the plan of care

## 2017-12-23 ENCOUNTER — Inpatient Hospital Stay (HOSPITAL_COMMUNITY): Payer: Medicare HMO | Admitting: Occupational Therapy

## 2017-12-23 ENCOUNTER — Inpatient Hospital Stay (HOSPITAL_COMMUNITY): Payer: Medicare HMO | Admitting: Physical Therapy

## 2017-12-23 LAB — GLUCOSE, CAPILLARY
GLUCOSE-CAPILLARY: 107 mg/dL — AB (ref 65–99)
GLUCOSE-CAPILLARY: 101 mg/dL — AB (ref 65–99)
GLUCOSE-CAPILLARY: 105 mg/dL — AB (ref 65–99)
GLUCOSE-CAPILLARY: 112 mg/dL — AB (ref 65–99)

## 2017-12-23 NOTE — Care Management (Signed)
Inpatient Rehabilitation Center Individual Statement of Services  Patient Name:  Rosalio LoudWilliam J Carothers  Date:  12/23/2017  Welcome to the Inpatient Rehabilitation Center.  Our goal is to provide you with an individualized program based on your diagnosis and situation, designed to meet your specific needs.  With this comprehensive rehabilitation program, you will be expected to participate in at least 3 hours of rehabilitation therapies Monday-Friday, with modified therapy programming on the weekends.  Your rehabilitation program will include the following services:  Physical Therapy (PT), Occupational Therapy (OT), Speech Therapy (ST), 24 hour per day rehabilitation nursing, Therapeutic Recreaction (TR), Neuropsychology, Case Management (Social Worker), Rehabilitation Medicine, Nutrition Services and Pharmacy Services  Weekly team conferences will be held on Tuesdays to discuss your progress.  Your Social Worker will talk with you frequently to get your input and to update you on team discussions.  Team conferences with you and your family in attendance may also be held.  Expected length of stay: 7-10 days    Overall anticipated outcome: supervision  Depending on your progress and recovery, your program may change. Your Social Worker will coordinate services and will keep you informed of any changes. Your Social Worker's name and contact numbers are listed  below.  The following services may also be recommended but are not provided by the Inpatient Rehabilitation Center:   Driving Evaluations  Home Health Rehabiltiation Services  Outpatient Rehabilitation Services    Arrangements will be made to provide these services after discharge if needed.  Arrangements include referral to agencies that provide these services.  Your insurance has been verified to be:  [employer] Your primary doctor is:  Dr. Julio Sickssei-Bonsu  Pertinent information will be shared with your doctor and your insurance  company.  Social Worker:  [place] PointLucy Jeaninne [place], [place] [phone] or ([phone]) [phone]   Information discussed with and copy given to patient by: Amada JupiterHOYLE, Dontarious Schaum, 12/23/2017, 11:47 AM

## 2017-12-23 NOTE — Progress Notes (Signed)
Patient ID: [name] [name], male   DOB: 1945/10/22, 72 y.o.   MRN: [id number]   [name] [name] is a 72 y.o. male who is admitted for CIR with weakness and deconditioning secondary to polytrauma with TBI  Subjective: No new complaints. No new problems.  Comfortable  Objective: Vital signs in last 24 hours: Temp:  [97.6 F (36.4 C)-98.3 F (36.8 C)] 98.2 F (36.8 C) (06/15 0503) Pulse Rate:  [75-95] 95 (06/15 0503) Resp:  [17] 17 (06/15 0503) BP: (120-131)/(64-71) 130/71 (06/15 0503) SpO2:  [96 %-99 %] 99 % (06/15 0503) Weight change:  Last BM Date: 12/21/17  Intake/Output from previous day: 06/14 0701 - 06/15 0700 In: 580 [P.O.:580] Out: 325 [Urine:325] Last cbgs: CBG (last 3)  Recent Labs    12/22/17 1702 12/22/17 2111 12/23/17 0702  GLUCAP 116* 123* 107*   Patient Vitals for the past 24 hrs:  BP Temp Temp src Pulse Resp SpO2  12/23/17 0503 130/71 98.2 F (36.8 C) - 95 17 99 %  12/22/17 2112 131/71 98.3 F (36.8 C) - 89 17 98 %  12/22/17 1823 120/64 97.6 F (36.4 C) Oral 75 17 96 %    Physical Exam General: No apparent distress   HEENT: C-collar in place Lungs: Normal effort. Lungs clear to auscultation, no crackles or wheezes. Cardiovascular: Regular rate and rhythm, no edema Abdomen: S/NT/ND; BS(+) Musculoskeletal: Right arm in a sling and dressed Neurological: No new neurological deficits Extremities.  No edema Skin: clear  Aging changes Mental state: Alert, oriented, cooperative    Lab Results: BMET    Component Value Date/Time   NA 142 12/21/2017 0711   K 3.9 12/21/2017 0711   CL 105 12/21/2017 0711   CO2 25 12/21/2017 0711   GLUCOSE 124 (H) 12/21/2017 0711   BUN 21 (H) 12/21/2017 0711   CREATININE 1.01 12/21/2017 0711   CALCIUM 8.6 (L) 12/21/2017 0711   GFRNONAA >60 12/21/2017 0711   GFRAA >60 12/21/2017 0711   CBC    Component Value Date/Time   WBC 6.7 12/21/2017 0711   RBC 2.88 (L) 12/21/2017 0711   HGB 8.3 (L) 12/21/2017 0711   HCT 25.8 (L) 12/21/2017 0711   PLT 188 12/21/2017 0711   MCV 89.6 12/21/2017 0711   MCH 28.8 12/21/2017 0711   MCHC 32.2 12/21/2017 0711   RDW 14.7 12/21/2017 0711   LYMPHSABS 1.2 12/21/2017 0711   MONOABS 0.7 12/21/2017 0711   EOSABS 0.2 12/21/2017 0711   BASOSABS 0.0 12/21/2017 0711    Medications: I have reviewed the patient's current medications.  Assessment/Plan:  Functional limitations and deconditioning secondary to polytrauma with TBI.  Continue CIR essential hypertension.  Blood pressure well controlled .  Continue metoprolol and losartan COPD stable ABLA with hemoglobin 8.3 g%.  Follow-up CBC on Monday Status post right proximal femoral fracture.  Status post ORIF T2DM.  Nice glycemic control.  Continue metformin    Length of stay, days: 3  Gordy SaversPeter F Kwiatkowski , MD 12/23/2017, 10:15 AM
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
## 2017-12-23 NOTE — Progress Notes (Signed)
Physical Therapy Session Note  Patient Details  Name: [name] [name] MRN: [id number] Date of Birth: 04-Aug-1945  Today's Date: 12/23/2017 PT Individual Time: 1020-1045 PT Individual Time Calculation (min): 25 min   Short Term Goals: Week 1:  PT Short Term Goal 1 (Week 1): =LTG  Skilled Therapeutic Interventions/Progress Updates:   Pt received sitting in WC and agreeable to PT. Pt's wife instructed in safe transfer technique for ambulatory toilet transfer. Min cues for proper guarding technique to facilitate anterior weight shift, proper support through the LUE and how to cue pt for doorway management. Toilet transfers completed x 2 with min assist overall. Gait training with HHA from PT x 126f wit min cues for improved step length on the R and safety in turns. Patient returned to room and [facility] reach and all needs met.  PT also applied Heat pad to the L hip at end of session for pain management with instruction for 240m rotation for heat/no heat to wife.        Therapy Documentation Precautions:  Precautions Precautions: Fall, Cervical Type of Shoulder Precautions: in shoulder immobilizer for 6 weeks, pendulum ex to start after 6/17 Shoulder Interventions: Shoulder sling/immobilizer, At all times Required Braces or Orthoses: Cervical Brace, Sling Cervical Brace: Hard collar, At all times Restrictions Weight Bearing Restrictions: Yes RUE Weight Bearing: Non weight bearing Other Position/Activity Restrictions: R UE Vital Signs: Therapy Vitals Temp: 98.2 F (36.8 C) Temp Source: Oral Pulse Rate: 94 Resp: 18 BP: 124/65 Patient Position (if appropriate): Lying Oxygen Therapy SpO2: 99 % O2 Device: Room Air Pain: Pain Assessment Pain Scale: 0-10 Pain Score: 3  Pain Type: Acute pain Pain Location: Hip Pain Orientation: L Pain Descriptors / Indicators: Aching Pain Onset: With Activity Pain Intervention(s): Repositioned  See Function  Navigator for Current Functional Status.   Therapy/Group: Individual Therapy  AuLorie Phenix/15/2019, 5:46 PM

## 2017-12-23 NOTE — Progress Notes (Signed)
+/-   sleep. PRN tylenol and robaxin given at 1800 and 0511. Complained this AM R/T aspen collar "rubbing my shoulder muscle". No redness noted. Compressing dressing and sling to RUE. Left wrist with dressing CD& I. Foam dressings to left anterior ankle and top of right foot. Wife at bedside. Alfredo MartinezMurray, Jaasiel [place] A

## 2017-12-23 NOTE — Progress Notes (Signed)
Occupational Therapy Session Note  Patient Details  Name: [name] [name] MRN: [id number] Date of Birth: March 09, 1946  Today's Date: 12/23/2017 OT Individual Time: 7116-5790 OT Individual Time Calculation (min): 57 min   Short Term Goals: Week 1:  OT Short Term Goal 1 (Week 1): STG=LTG d/t ELOS   Skilled Therapeutic Interventions/Progress Updates:    Pt greeted seated in wc with spouse [name] present and agreeable to OT treatment session. Treatment session focused on modified bathing/dressing and improved sit<>stand. Pt brought to the sink in wc to the sink. He was then able to activate core and reach forward to turn on hot water with L UE. Set-up A to obtain wash cloth, then pt able to wash upper body. Sit<>stand with min guard A and increased time to initiate stand. Pt tolerated standing for ~5 mins. With set-up A, pt able to wash buttocks and peri-area.Pt needed assistance to thread R UE through new gown as he declined donning clothes today. OT discussed sling management and dressing strategies with pt and spouse. Worked on donning TED hose using friction reducing device and educated pt's spouse how adapted strategies for assisting with TEDs. Pt able to help pull TEDs up calf but unable to start them on his own. Pt left seated in wc at end of session with spouse present and needs met.   Therapy Documentation Precautions:  Precautions Precautions: Fall, Cervical Type of Shoulder Precautions: in shoulder immobilizer for 6 weeks, pendulum ex to start after 6/17 Shoulder Interventions: Shoulder sling/immobilizer, At all times Required Braces or Orthoses: Cervical Brace, Sling Cervical Brace: Hard collar, At all times Restrictions Weight Bearing Restrictions: Yes RUE Weight Bearing: Non weight bearing Other Position/Activity Restrictions: R UE Pain: Pain Assessment Pain Scale: 0-10 Pain Score: 3  Pain Type: Acute pain Pain Location: Hip Pain Orientation: Right Pain Descriptors /  Indicators: Aching Pain Onset: With Activity Pain Intervention(s): Repositioned ADL: ADL ADL Comments: Please see functional navigator  See Function Navigator for Current Functional Status.   Therapy/Group: Individual Therapy  Valma Cava 12/23/2017, 1:53 PM

## 2017-12-24 ENCOUNTER — Inpatient Hospital Stay (HOSPITAL_COMMUNITY): Payer: Medicare HMO | Admitting: Occupational Therapy

## 2017-12-24 ENCOUNTER — Inpatient Hospital Stay (HOSPITAL_COMMUNITY): Payer: Medicare HMO | Admitting: Physical Therapy

## 2017-12-24 LAB — GLUCOSE, CAPILLARY
GLUCOSE-CAPILLARY: 100 mg/dL — AB (ref 65–99)
Glucose-Capillary: 118 mg/dL — ABNORMAL HIGH (ref 65–99)
Glucose-Capillary: 108 mg/dL — ABNORMAL HIGH (ref 65–99)
Glucose-Capillary: 123 mg/dL — ABNORMAL HIGH (ref 65–99)

## 2017-12-24 NOTE — Progress Notes (Signed)
Patient ID: [name] LoudWilliam J Levan, male   DOB: 09/04/1945, 72 y.o.   MRN: [id number]   [name] [name] is a 72 y.o. 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  Past Medical History:  Diagnosis Date  . Allergic rhinitis   . Asthma   . Asthma   . Chronic kidney disease    kidney stones  . Complication of anesthesia    Difficult arousing; c/o some trouble breathing when waking up ; says "they need to sit me up that helps "  . COPD (chronic obstructive pulmonary disease) (HCC)   . Diabetes mellitus type II   . Diabetes mellitus without complication (HCC)   . Diverticulosis of colon with hemorrhage 02/15/2015  . DVT, lower extremity (HCC) 2018   RLE  . Dysrhythmia    tachycardia  . Headache   . Hypertension   . Sleep apnea      Subjective: No new complaints. No new problems.  Complaining of increased low back pain and stiffness this a.m.  Objective: Vital signs in last 24 hours: Temp:  [97.6 F (36.4 C)-98.2 F (36.8 C)] 98 F (36.7 C) (06/16 0529) Pulse Rate:  [88-98] 88 (06/16 0529) Resp:  [18] 18 (06/16 0529) BP: (123-128)/(62-74) 123/74 (06/16 0529) SpO2:  [97 %-99 %] 97 % (06/16 0915) Weight change:  Last BM Date: 12/21/17  Intake/Output from previous day: 06/15 0701 - 06/16 0700 In: 960 [P.O.:960] Out: -  Last cbgs: CBG (last 3)  Recent Labs    12/23/17 1645 12/23/17 2137 12/24/17 0710  GLUCAP 105* 112* 100*   Patient Vitals for the past 24 hrs:  BP Temp Temp src Pulse Resp SpO2  12/24/17 0915 - - - - - 97 %  12/24/17 0529 123/74 98 F (36.7 C) - 88 18 97 %  12/23/17 2134 128/62 97.6 F (36.4 C) Oral 98 18 97 %  12/23/17 1455 124/65 98.2 F (36.8 C) Oral 94 18 99 %     Physical Exam General: No apparent distress alert and appropriate HEENT: Moist Lungs: Normal effort. Lungs clear to auscultation, no crackles or wheezes. Cardiovascular: Regular rate and rhythm, no edema Abdomen: S/NT/ND; BS(+) Musculoskeletal:  Cervical collar in place.  Right arm in a sling Neurological: No new neurological deficits Extremities.  No edema Skin: clear   Mental state: Alert, oriented, cooperative    Lab Results: BMET    Component Value Date/Time   NA 142 12/21/2017 0711   K 3.9 12/21/2017 0711   CL 105 12/21/2017 0711   CO2 25 12/21/2017 0711   GLUCOSE 124 (H) 12/21/2017 0711   BUN 21 (H) 12/21/2017 0711   CREATININE 1.01 12/21/2017 0711   CALCIUM 8.6 (L) 12/21/2017 0711   GFRNONAA >60 12/21/2017 0711   GFRAA >60 12/21/2017 0711   CBC    Component Value Date/Time   WBC 6.7 12/21/2017 0711   RBC 2.88 (L) 12/21/2017 0711   HGB 8.3 (L) 12/21/2017 0711   HCT 25.8 (L) 12/21/2017 0711   PLT 188 12/21/2017 0711   MCV 89.6 12/21/2017 0711   MCH 28.8 12/21/2017 0711   MCHC 32.2 12/21/2017 0711   RDW 14.7 12/21/2017 0711   LYMPHSABS 1.2 12/21/2017 0711   MONOABS 0.7 12/21/2017 0711   EOSABS 0.2 12/21/2017 0711   BASOSABS 0.0 12/21/2017 0711   Medications: I have reviewed the patient's current medications.  Assessment/Plan:  Functional deficits following polytrauma with TBI.  Continue CIR DVT prophylaxis.  Continue Lovenox Seizure prophylaxis.  Continue Keppra Essential hypertension.  Remains well controlled.  Continue metoprolol and losartan COPD stable ABLA.  Hemoglobin 8.3 g%.  Will recheck in a.m. Status post right proximal humeral fracture status post ORIF T2DM.  Nice glycemic control.  Continue metformin    Length of stay, days: 4  Gordy Savers , MD 12/24/2017, 9:41 AM

## 2017-12-24 NOTE — Progress Notes (Signed)
Occupational Therapy Session Note  Patient Details  Name: [name] [name] MRN: [id number] Date of Birth: 09/26/1945  Today's Date: 12/24/2017 OT Individual Time: [id number] and 1450-1530 OT Individual Time Calculation (min): 75 min and 40 min  Short Term Goals: Week 1:  OT Short Term Goal 1 (Week 1): STG=LTG d/t ELOS   Skilled Therapeutic Interventions/Progress Updates:    Pt greeted supine in bed with RN present to give pain medication. He reported 9/10 pain in Lt ribs/flank area this AM. Pain exacerbated when trying to sit up. Provided pt with MHP for flank, and educated him on deep breathing and visualization techniques. Discussed with pt/spouse concept of pain maps, and how mindfulness techniques can dampen perception of pain. Spouse wrote down this information on her notepad. After eating oatmeal EOB, pt reported pain decreased. Agreeable to engage in ADLs. Worked on one-handed bathing/dressing techniques for remainder of session. Pt able to achieve figure 4 today for washing feet, however still required assist to don gripper socks using adaptive strategies. Teds donned total A by therapist. Pt opted to don hospital gown for comfort. Had him assist with threading arms through gown sleeves to work on skills for UB dressing. Spouse observing while OT doffed/donned and adjusted arm sling throughout tx. At end of session pt completed stand pivot<w/c with steady assist and extra time. He was left with spouse and respiratory therapist.   Also educated spouse Vicky on method for managing bed alarm, as she is now trained caregiver. Utilized teach back method to assess understanding, with education emphasis placed on setting bed alarm whenever she leaves spouse's room. She verbalized understanding.   2nd Session 1:1 tx (40 min) Pt greeted supine in bed. Exhausted from PT session and initially declining tx. Therefore, focused on caregiver training for session. Taught spouse Vicky 2 adaptive techniques  for donning Teds. Education emphasis placed on joint protection strategies and proper body mechanics. She has issues with her back, so cued her to activate core and bend with knees or sit while assisting. After demonstration, Vicky initially required mod instructional cues for success, which faded to no cuing with repetition. Spouse very appreciative of education. Also provided pt with lavender essential oil to use via inhalation for pain mgt. He still c/o muscle spasms that "catch" during functional movements (Lt ribs/flank/hip). Per pt, it's the worst in the AM. Heat during therapies has been helpful. Discussed with RN order for k-pad for night use in order to decrease pain/soreness in AM. Pt left with spouse at session exit.   Therapy Documentation Precautions:  Precautions Precautions: Fall, Cervical Type of Shoulder Precautions: in shoulder immobilizer for 6 weeks, pendulum ex to start after 6/17 Shoulder Interventions: Shoulder sling/immobilizer, At all times Required Braces or Orthoses: Cervical Brace, Sling Cervical Brace: Hard collar, At all times Restrictions Weight Bearing Restrictions: Yes RUE Weight Bearing: Non weight bearing Other Position/Activity Restrictions: R UE Vital Signs: Oxygen Therapy SpO2: 97 % O2 Device: Room Air Pain: Pain Assessment Pain Scale: 0-10 Pain Score: 5  Pain Type: Acute pain Pain Location: Hip Pain Orientation: Left Pain Descriptors / Indicators: Aching Pain Onset: With Activity Pain Intervention(s): Medication (See eMAR)(tylenol and metocarbamol) Multiple Pain Sites: No ADL: ADL ADL Comments: Please see functional navigator     See Function Navigator for Current Functional Status.   Therapy/Group: Individual Therapy  Katriel Cutsforth A Kariana Wiles 12/24/2017, 11:49 AM

## 2017-12-24 NOTE — Progress Notes (Signed)
Physical Therapy Session Note  Patient Details  Name: [name] LoudWilliam J Kersten MRN: [id number] Date of Birth: 10/11/1945  Today's Date: 12/24/2017 PT Individual Time: 1045-1200 PT Individual Time Calculation (min): 75 min   Short Term Goals: Week 1:  PT Short Term Goal 1 (Week 1): =LTG  Skilled Therapeutic Interventions/Progress Updates:    no c/o pain at rest, but does endorse some L LBP with increasing activity.  No TTP over spinous processes or paraspinals.  Pain with activation of core to stand/sit/balance, which is relieved with muscle stretch.  Provided education on lower back stretches in sitting in addition to use of heat and prescribed topical cream to help alleviate discomfort.  Session focus on pain management, transfers, gait, and balance.   Pt transfers stand/pivot and sit<>stand with min assist to maintain forward weight shift.  Gait training in therapy gym with HHA and min guard x120'.  High level gait weaving through cones, then forward/retro/side stepping through cones to L and R with min guard<>min assist focus on balance, postural control, and hip strengthening.  Alternating toe taps to 4" steps 2 trials to fatigue with min guard and occasional bouts of close supervision, focus on weight shifting, stance control, and postural control with pt noting increased pain in L lower back with SLS on RLE.  Pt ambulated back to room at end of session, positioned upright in w/c with heat applied to lower back, call bell in reach, and wife present at bedside.   Therapy Documentation Precautions:  Precautions Precautions: Fall, Cervical Type of Shoulder Precautions: in shoulder immobilizer for 6 weeks, pendulum ex to start after 6/17 Shoulder Interventions: Shoulder sling/immobilizer, At all times Required Braces or Orthoses: Cervical Brace, Sling Cervical Brace: Hard collar, At all times Restrictions Weight Bearing Restrictions: Yes RUE Weight Bearing: Non weight bearing Other  Position/Activity Restrictions: R UE   See Function Navigator for Current Functional Status.   Therapy/Group: Individual Therapy  Stephania FragminCaitlin E Dorisann Schwanke 12/24/2017, 12:00 PM

## 2017-12-24 NOTE — Plan of Care (Signed)
  Problem: Consults Goal: RH GENERAL PATIENT EDUCATION Description See Patient Education module for education specifics. Outcome: Progressing   Problem: RH BOWEL ELIMINATION Goal: RH STG MANAGE BOWEL WITH ASSISTANCE Description STG Manage Bowel with Mod I Assistance.  Outcome: Progressing   Problem: RH SKIN INTEGRITY Goal: RH STG SKIN FREE OF INFECTION/BREAKDOWN Description No new breakdown with mod I assist   Outcome: Progressing Goal: RH STG ABLE TO PERFORM INCISION/WOUND CARE W/ASSISTANCE Description STG Able To Perform Incision/Wound Care With Mod I Assistance.  Outcome: Progressing   Problem: RH SAFETY Goal: RH STG ADHERE TO SAFETY PRECAUTIONS W/ASSISTANCE/DEVICE Description STG Adhere to Safety Precautions With Mod I Assistance/Device.  Outcome: Progressing   Problem: RH PAIN MANAGEMENT Goal: RH STG PAIN MANAGED AT OR BELOW PT'S PAIN GOAL Description <3 out 10.   Outcome: Progressing

## 2017-12-25 ENCOUNTER — Inpatient Hospital Stay (HOSPITAL_COMMUNITY): Payer: Medicare HMO | Admitting: Occupational Therapy

## 2017-12-25 ENCOUNTER — Inpatient Hospital Stay (HOSPITAL_COMMUNITY): Payer: Medicare HMO | Admitting: Physical Therapy

## 2017-12-25 LAB — CBC
HCT: 30 % — ABNORMAL LOW (ref 39.0–52.0)
HEMOGLOBIN: 9.4 g/dL — AB (ref 13.0–17.0)
MCH: 29.3 pg (ref 26.0–34.0)
MCHC: 31.3 g/dL (ref 30.0–36.0)
MCV: 93.5 fL (ref 78.0–100.0)
Platelets: 360 10*3/uL (ref 150–400)
RBC: 3.21 MIL/uL — ABNORMAL LOW (ref 4.22–5.81)
RDW: 16.4 % — ABNORMAL HIGH (ref 11.5–15.5)
WBC: 6.8 10*3/uL (ref 4.0–10.5)

## 2017-12-25 LAB — GLUCOSE, CAPILLARY
GLUCOSE-CAPILLARY: 118 mg/dL — AB (ref 65–99)
GLUCOSE-CAPILLARY: 129 mg/dL — AB (ref 65–99)
Glucose-Capillary: 100 mg/dL — ABNORMAL HIGH (ref 65–99)
Glucose-Capillary: 111 mg/dL — ABNORMAL HIGH (ref 65–99)

## 2017-12-25 NOTE — Progress Notes (Signed)
PRN robaxin and tylenol given at 2135 and 0608. Complained of constipation, requesting supp after therapies.[name] MartinezMurray, [name] A

## 2017-12-25 NOTE — Progress Notes (Signed)
Physical Therapy Session Note  Patient Details  Name: [name] [name] MRN: [id number] Date of Birth: Mar 13, 1946  Today's Date: 12/25/2017 PT Individual Time: 1300-1355 PT Individual Time Calculation (min): 55 min   Short Term Goals: Week 1:  PT Short Term Goal 1 (Week 1): =LTG  Skilled Therapeutic Interventions/Progress Updates:    Pt in bed agreeable to therapy. Pt performs supine to sit with use of bedrail and increased time due to Lt back/side pain.  Pt performs sit to stand throughout treatment with supervision/min guard, increased time due to Lt back/side pain.  Berg balance test performed with pt scoring 23/56. Pt aware of risk for falls. Pt still with increased pain with use of AD in Lt UE, still min A with gait at this time.  Pt performs gait in controlled environment and with obstacle negotiation with min guard, small and guarded steps, no LOB.  Pt left in bed with wife present, needs at hand.  Therapy Documentation Precautions:  Precautions Precautions: Fall, Cervical Type of Shoulder Precautions: in shoulder immobilizer for 6 weeks, pendulum ex to start after 6/17 Shoulder Interventions: Shoulder sling/immobilizer, At all times Required Braces or Orthoses: Cervical Brace, Sling Cervical Brace: Hard collar, At all times Restrictions Weight Bearing Restrictions: Yes RUE Weight Bearing: Non weight bearing Other Position/Activity Restrictions: R UE Pain: Pain Assessment Pain Scale: 0-10 Pain Score: 0-No pain Faces Pain Scale: Hurts even more Pain Type: Acute pain Pain Location: Back Pain Orientation: Left Pain Descriptors / Indicators: Aching Pain Onset: With Activity Pain Intervention(s): Repositioned;RN made aware  Balance: Berg Balance Test Sit to Stand: Needs minimal aid to stand or to stabilize Standing Unsupported: Able to stand 2 minutes with supervision Sitting with Back Unsupported but Feet Supported on Floor or Stool: Able to sit safely and securely 2  minutes Stand to Sit: Needs assistance to sit Transfers: Needs one person to assist Standing Unsupported with Eyes Closed: Able to stand 10 seconds with supervision Standing Ubsupported with Feet Together: Able to place feet together independently and stand for 1 minute with supervision From Standing, Reach Forward with Outstretched Arm: Can reach forward >12 cm safely (5") From Standing Position, Pick up Object from Floor: Unable to try/needs assist to keep balance From Standing Position, Turn to Look Behind Over each Shoulder: Needs supervision when turning Turn 360 Degrees: Needs close supervision or verbal cueing Standing Unsupported, Alternately Place Feet on Step/Stool: Able to complete >2 steps/needs minimal assist Standing Unsupported, One Foot in Front: Able to take small step independently and hold 30 seconds Standing on One Leg: Unable to try or needs assist to prevent fall Total Score: 23  See Function Navigator for Current Functional Status.   Therapy/Group: Individual Therapy  [name] [name] 12/25/2017, 1:33 PM

## 2017-12-25 NOTE — Progress Notes (Addendum)
[name] [name] PHYSICAL MEDICINE & REHABILITATION     PROGRESS NOTE    Subjective/Complaints: Had a reasonable weekend. Pain controlled.   ROS: Patient denies fever, rash, sore throat, blurred vision, nausea, vomiting, diarrhea, cough, shortness of breath or chest pain, joint or back pain, headache, or mood change.   Objective:  No results found. Recent Labs    12/25/17 0616  WBC 6.8  HGB 9.4*  HCT 30.0*  PLT 360   No results for input(s): NA, K, CL, GLUCOSE, BUN, CREATININE, CALCIUM in the last 72 hours.  Invalid input(s): CO CBG (last 3)  Recent Labs    12/24/17 2104 12/25/17 0658 12/25/17 1129  GLUCAP 123* 118* 129*    Wt Readings from Last 3 Encounters:  12/20/17 108.8 kg (239 lb 13.8 oz)  01/24/17 64.4 kg (142 lb)  01/19/17 64.7 kg (142 lb 9.6 oz)     Intake/Output Summary (Last 24 hours) at 12/25/2017 1636 Last data filed at 12/25/2017 1208 Gross per 24 hour  Intake 480 ml  Output -  Net 480 ml    Vital Signs: Blood pressure (!) 129/91, pulse (!) 120, temperature 97.7 F (36.5 C), temperature source Oral, resp. rate 18, height 5\' 7"  (1.702 m), weight 108.8 kg (239 lb 13.8 oz), SpO2 95 %. Physical Exam:   Constitutional: No distress . Vital signs reviewed. HEENT: EOMI, oral membranes moist Neck: supple Cardiovascular: RRR without murmur. No JVD    Respiratory: CTA Bilaterally without wheezes or rales. Normal effort    GI: BS +, non-tender, non-distended  Musculoskeletal:  RUE dressed, in sling. Neurological: He isalertand oriented to person, place, and time. Motor: left upper extremity: 4+/5 proximal to distal Right upper extremity: Shoulder abduction 1/5, elbow brace, and grip 3/5 Left lower extremity: Hip flexion 4-/5, knee extension 4/5, ankle dorsiflexion 4+/5 Sensation intact to light touch.motor and sensory exam stable---no changes.  Remains alert and appropriate.  Skin:  Dressing to shoulder and arm removed. Dressing completely covered in  dry blood. Only a few small areas of drainage which are still open on forearm. Surgical site intact with sutures right shoulder.  Psychiatric: He has a normal mood and affect. His behavior is normal.        Assessment/Plan: 1. 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 Physiatrist is providing close team supervision and 24 hour management of active medical problems listed below. Physiatrist and rehab team continue to assess barriers to discharge/monitor patient progress toward functional and medical goals.  Function:  Bathing Bathing position   Position: Sitting EOB  Bathing parts Body parts bathed by patient: Chest, Abdomen, Front perineal area, Right upper leg, Left upper leg, Buttocks, Right lower leg, Left lower leg, Left arm Body parts bathed by helper: Left arm, Right lower leg, Left lower leg, Back  Bathing assist Assist Level: Touching or steadying assistance(Pt > 75%)      Upper Body Dressing/Undressing Upper body dressing   What is the patient wearing?: Hospital gown     Pull over shirt/dress - Perfomed by patient: Pull shirt over trunk Pull over shirt/dress - Perfomed by helper: Thread/unthread right sleeve, Put head through opening, Thread/unthread left sleeve        Upper body assist Assist Level: Touching or steadying assistance(Pt > 75%)      Lower Body Dressing/Undressing Lower body dressing   What is the patient wearing?: Ted Hose, Non-skid slipper socks  Pants- Performed by patient: Pull pants up/down Pants- Performed by helper: Thread/unthread right pants leg, Thread/unthread left pants leg   Non-skid slipper socks- Performed by helper: Don/doff right sock, Don/doff left sock               TED Hose - Performed by helper: Don/doff right TED hose, Don/doff left TED hose  Lower body assist Assist for lower body dressing:  (total assist, 20%)      Toileting Toileting Toileting activity did not occur: No continent bowel/bladder event   Toileting steps completed by helper: Adjust clothing prior to toileting, Performs perineal hygiene, Adjust clothing after toileting Toileting Assistive Devices: Grab bar or rail  Toileting assist Assist level: Touching or steadying assistance (Pt.75%)   Transfers Chair/bed transfer   Chair/bed transfer method: Ambulatory Chair/bed transfer assist level: Touching or steadying assistance (Pt > 75%) Chair/bed transfer assistive device: Armrests, Orthosis     Locomotion Ambulation     Max distance: 250' Assist level: Touching or steadying assistance (Pt > 75%)   Wheelchair   Type: Manual   Assist Level: Supervision or verbal cues  Cognition Comprehension Comprehension assist level: Follows complex conversation/direction with extra time/assistive device  Expression Expression assist level: Expresses complex ideas: With extra time/assistive device  Social Interaction Social Interaction assist level: Interacts appropriately with others with medication or extra time (anti-anxiety, antidepressant).  Problem Solving Problem solving assist level: Solves complex problems: With extra time  Memory Memory assist level: More than reasonable amount of time   Medical Problem List and Plan: 1.  Weakness, limited endurance, limitation in self-care secondary to polytrauma with TBI.  -continue therapies 2.  DVT Prophylaxis/Anticoagulation: Pharmaceutical: Lovenox 3. Pain Management: Managed with tylenol and robaxin 4. Mood: LCSW to follow for evaluation and support.  5. Neuropsych: This patient is capable of making decisions on his own behalf. 6. Skin/Wound Care: non-adherent dressing to open areas right arm, kerlex 7. Fluids/Electrolytes/Nutrition: encourage PO  -increase intake/proteint    -protein supp added 8.  Seizure prophylaxis: On keppra 9. HTN: Monitor BP bid--continue  Cozaar and metoprolol.  10. Constipation: Increased miralax to bid- needs bm 11. COPD: Resumed Combivent bid. Encourage IS while awake. 12. ABLA:  hgb 8.3 6/13. No signs of blood loss  -recheck up to 9.4 today 13. Right proximal humerus fracture s/p ORIF 6/10: NWB RUE;  -begin pendulum exercises this week. 14. OSA?: Untreated. Encouraged patient to follow up with MD for testing after discharge.  15. T2DM: Monitor BS ac/hs. Resumed metformin.   SSI for elevated BS/thigter control to help promote wound healing.   -sugars well controlled 6/17 16. Thrombocytopenia: resolved    LOS (Days) 5 A FACE TO FACE EVALUATION WAS PERFORMED  Ranelle Oyster, MD 12/25/2017 4:36 PM

## 2017-12-25 NOTE — Progress Notes (Signed)
Occupational Therapy Session Note  Patient Details  Name: [name] [name] MRN: [id number] Date of Birth: 01/29/1946  Today's Date: 12/25/2017 OT Individual Time: [id number] and [id number] OT Individual Time Calculation (min): 56 min and 70 min    Short Term Goals: Week 1:  OT Short Term Goal 1 (Week 1): STG=LTG d/t ELOS   Skilled Therapeutic Interventions/Progress Updates:    Session 1: Upon entering the room, pt seated on EOB with Wife present in the room. Pt reports 6/10 pain in L lateral side but premedicated prior to OT arrival.  OT and caregiver discussed home set up and OT goals with pt and caregiver agreement. OT provided pt with paper handout regarding energy conservation as well as every day examples to be utilized at discharge. OT set pt up for bathing with wife on EOB as therapist exited the room. Call bell and all needed items within reach.   Session 2: RN and RT present to give medications upon entering the room. Pt agreeable to participate in OT intervention this session. Pt continues to report increased L rib pain but not due for pain medication at this time. Pt performed sit <>Stand with min A and ambulating with hand held assistance to wheelchair. OT propelled pt outside for energy conservation via wheelchair. Pt ambulating 250' with hand held assistance and taking several standing rest breaks. Pt with slow rate of speed and needing min cues for forward gaze with ambulation. Pt remained in wheelchair once arriving back to unit at end of session. Call bell and all needed items within reach upon exiting the room.   Therapy Documentation Precautions:  Precautions Precautions: Fall, Cervical Type of Shoulder Precautions: in shoulder immobilizer for 6 weeks, pendulum ex to start after 6/17 Shoulder Interventions: Shoulder sling/immobilizer, At all times Required Braces or Orthoses: Cervical Brace, Sling Cervical Brace: Hard collar, At all times Restrictions Weight Bearing  Restrictions: Yes RUE Weight Bearing: Non weight bearing Other Position/Activity Restrictions: R UE General:   Vital Signs: Therapy Vitals Pulse Rate: 80 BP: 128/78 Oxygen Therapy SpO2: 98 % O2 Device: Room Air Pain:   ADL: ADL ADL Comments: Please see functional navigator  See Function Navigator for Current Functional Status.   Therapy/Group: Individual Therapy  [name], [name] 12/25/2017, 12:47 PM

## 2017-12-26 ENCOUNTER — Inpatient Hospital Stay (HOSPITAL_COMMUNITY): Payer: Medicare HMO | Admitting: Physical Therapy

## 2017-12-26 ENCOUNTER — Inpatient Hospital Stay (HOSPITAL_COMMUNITY): Payer: Medicare HMO | Admitting: Occupational Therapy

## 2017-12-26 LAB — GLUCOSE, CAPILLARY
GLUCOSE-CAPILLARY: 106 mg/dL — AB (ref 65–99)
GLUCOSE-CAPILLARY: 124 mg/dL — AB (ref 65–99)
Glucose-Capillary: 107 mg/dL — ABNORMAL HIGH (ref 65–99)
Glucose-Capillary: 121 mg/dL — ABNORMAL HIGH (ref 65–99)

## 2017-12-26 MED ORDER — CYCLOBENZAPRINE HCL 5 MG PO TABS: 5.0000 mg | ORAL_TABLET | Freq: Three times a day (TID) | ORAL | Status: DC

## 2017-12-26 MED ORDER — PREMIER PROTEIN SHAKE
11.0000 [oz_av] | Freq: Three times a day (TID) | ORAL | Status: DC
  Administered 2017-12-26 – 2018-01-02 (×18): 11 [oz_av] via ORAL
  Filled 2017-12-26 (×32): qty 325.31

## 2017-12-26 MED ORDER — METHOCARBAMOL 500 MG PO TABS
250.0000 mg | ORAL_TABLET | Freq: Four times a day (QID) | ORAL | Status: DC | PRN
  Administered 2017-12-26 – 2018-01-02 (×16): 500 mg via ORAL
  Filled 2017-12-26 (×16): qty 1

## 2017-12-26 MED ORDER — LIDOCAINE 5 % EX PTCH
1.0000 | MEDICATED_PATCH | CUTANEOUS | Status: DC
  Administered 2017-12-26 – 2017-12-28 (×3): 1 via TRANSDERMAL
  Filled 2017-12-26 (×3): qty 1

## 2017-12-26 NOTE — Progress Notes (Signed)
Physical Therapy Session Note  Patient Details  Name: [name] [name] MRN: [id number] Date of Birth: 1945/09/15  Today's Date: 12/26/2017 PT Individual Time: 1300-1330 PT Individual Time Calculation (min): 30 min   Short Term Goals: Week 1:  PT Short Term Goal 1 (Week 1): =LTG  Skilled Therapeutic Interventions/Progress Updates:    Pt performs sit to stand multiple reps throughout session with min A due to Lt side pain.  Gait 150' x 2 with steadying assist.  Gait with stepping over objects with min guard, cues for technique.  Stair training multiple step ups to 4'' step with min A for balance. Pt left in bed with wife present, needs at hand.  Therapy Documentation Precautions:  Precautions Precautions: Fall, Cervical Type of Shoulder Precautions: in shoulder immobilizer for 6 weeks, pendulum ex to start after 6/17 Shoulder Interventions: Shoulder sling/immobilizer, At all times Required Braces or Orthoses: Cervical Brace, Sling Cervical Brace: Hard collar, At all times Restrictions Weight Bearing Restrictions: Yes RUE Weight Bearing: Non weight bearing Other Position/Activity Restrictions: R UE General:   Vital Signs: Therapy Vitals Temp: 98.2 F (36.8 C) Temp Source: Oral Pulse Rate: (!) 106 BP: 124/69 Patient Position (if appropriate): Lying Oxygen Therapy SpO2: 100 % O2 Device: Room Air Pain:  continued c/o pain on Lt side/back, pain patch applied prior to therapy.    Therapy/Group: Individual Therapy  Alvino Lechuga 12/26/2017, 2:32 PM

## 2017-12-26 NOTE — Progress Notes (Signed)
Physical Therapy Session Note  Patient Details  Name: [name] LoudWilliam J Amason MRN: [id number] Date of Birth: 1945-09-12  Today's Date: 12/26/2017 PT Individual Time: 1400-1500 PT Individual Time Calculation (min): 60 min   Short Term Goals: Week 1:  PT Short Term Goal 1 (Week 1): =LTG  Skilled Therapeutic Interventions/Progress Updates: Pt received supine in bed, c/o pain as below and declines out of room treatment as he just returned from gym recently. Agreeable to session in room. Pt reports he did several balance activities with previous therapist, would prefer to focus on LE strength. Supine>sit with S and bedrails, HOB elevated and increased time d/t back pain.  Stand pivot min guard to w/c. Performed LE exercises as below and provided handout of HEP to continue performance in the evenings/weekends to continue progressing LE strength.   Exercises  Seated March - 10 reps - 3 sets - 1x daily - 7x weekly  Seated Long Arc Quad - 10 reps - 3 sets - 3 hold - 1x daily - 7x weekly with 3# ankle weights Seated Hip Adduction Isometrics with Ball - 10 reps - 3 sets - 1x daily - 7x weekly  Seated Heel Toe Raises - 10 reps - 3 sets - 1x daily - 7x weekly  Seated Gluteal Sets - 10 reps - 3 sets - 3 hold - 1x daily - 7x weekly   Pt reports L lateral ankle pain with heel/toe raises in sitting. Sock/TED hose removed to inspect; pt tender to palpation near ATFL/CF ligaments at lateral ankle, mild pain with resisted ankle eversion, and discoloration/edema at lateral ankle and dorsal aspect of foot, as well as mild discoloration at medial ankle. Educated pt in purpose of kinesiotape to assist with edema reduction/bruising and relieve pain; pt agreeable to try and denies allergy to latex/tape. Applied kinesiotape to lateral ankle; educated pt in wear 2-5 days and removal at signs of irritation. Remained seated in w/c at end of session, wife present and all needs in reach.       Therapy Documentation Precautions:   Precautions Precautions: Fall, Cervical Type of Shoulder Precautions: in shoulder immobilizer for 6 weeks, pendulum ex to start after 6/17 Shoulder Interventions: Shoulder sling/immobilizer, At all times Required Braces or Orthoses: Cervical Brace, Sling Cervical Brace: Hard collar, At all times Restrictions Weight Bearing Restrictions: Yes RUE Weight Bearing: Non weight bearing Other Position/Activity Restrictions: R UE Pain: Pain Assessment Pain Scale: 0-10 Pain Score: 0-No pain   See Function Navigator for Current Functional Status.   Therapy/Group: Individual Therapy  Vista Lawmanlizabeth J Tygielski 12/26/2017, 3:27 PM
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
## 2017-12-26 NOTE — Progress Notes (Signed)
Physical Therapy Session Note  Patient Details  Name: [name] [name] MRN: [id number] Date of Birth: 11-12-1945  Today's Date: 12/26/2017 PT Individual Time: 0900-0925 PT Individual Time Calculation (min): 25 min   Short Term Goals: Week 1:  PT Short Term Goal 1 (Week 1): =LTG  Skilled Therapeutic Interventions/Progress Updates:   Pt in supine and agreeable to therapy, RN present providing medication for pain. Pt reports increased "wooziness" this morning, prompting his return to supine after initially getting up. Agreeable to move slowly during therapy. Transferred to EOB and to w/c w/ min assist and increased time 2/2 muscle spasms in low back. Worked on LE strengthening and global endurance while self-propelling w/c around unit at supervision level w/ BLEs and LUE. Brief intermittent rest breaks, >300' overall. Educated pt on effects of pain medication and muscle relaxers on arousal and need to move cautiously for safety. Returned to room and ended session in w/c, in care of wife, and all needs met.   Therapy Documentation Precautions:  Precautions Precautions: Fall, Cervical Type of Shoulder Precautions: in shoulder immobilizer for 6 weeks, pendulum ex to start after 6/17 Shoulder Interventions: Shoulder sling/immobilizer, At all times Required Braces or Orthoses: Cervical Brace, Sling Cervical Brace: Hard collar, At all times Restrictions Weight Bearing Restrictions: Yes RUE Weight Bearing: Non weight bearing Other Position/Activity Restrictions: R UE Pain: Pain Assessment Pain Scale: 0-10 Pain Score: 3  Pain Type: Acute pain Pain Location: Head Pain Descriptors / Indicators: Headache Pain Intervention(s): Medication (See eMAR)  See Function Navigator for Current Functional Status.   Therapy/Group: Individual Therapy  Cyrah Mclamb K Arnette 12/26/2017, 9:26 AM

## 2017-12-26 NOTE — Progress Notes (Signed)
Social Work Patient ID: [name] [name], male   DOB: 02-05-1946, 72 y.o.   MRN: [id number]  Met with pt and wife to discuss team conference goals supervision level and target discharge 6/25. Both are pleased with this plan and hopes the lidoderm patch will help his pain they started today. Wife stays here with him and participates in his care. Will work on discharge needs for discharge next Tuesday.
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
## 2017-12-26 NOTE — Progress Notes (Signed)
Occupational Therapy Session Note  Patient Details  Name: [name] [name] MRN: [id number] Date of Birth: 23-Sep-1945  Today's Date: 12/26/2017 OT Individual Time: [id number] OT Individual Time Calculation (min): 72 min    Short Term Goals: Week 1:  OT Short Term Goal 1 (Week 1): STG=LTG d/t ELOS   Skilled Therapeutic Interventions/Progress Updates:    Upon entering the room, pt seated in wheelchair with wife present in room. Pt continues to report pain and "hitch" in hip when performing sit >stand. Pt agreeable to OT intervention with encouragement. Pt ambulating 200' from room to with steady assistance and min verbal cues for forward gaze. Pt performed sit >supine on standard flat bed with mod A for B LEs. OT positioned pt in side lying on L side. Pt performed supine >sit with min A for trunk and taking seated rest break before returning to room in same manner as above. Pt seated in wheelchair at room and therapist provided caregiver with handout regarding side laying postioning for comfort when returning home. Call bell and all needed items within reach upon exiting the room.   Therapy Documentation Precautions:  Precautions Precautions: Fall, Cervical Type of Shoulder Precautions: in shoulder immobilizer for 6 weeks, pendulum ex to start after 6/17 Shoulder Interventions: Shoulder sling/immobilizer, At all times Required Braces or Orthoses: Cervical Brace, Sling Cervical Brace: Hard collar, At all times Restrictions Weight Bearing Restrictions: Yes RUE Weight Bearing: Non weight bearing Other Position/Activity Restrictions: R UE    ADL: ADL ADL Comments: Please see functional navigator  See Function Navigator for Current Functional Status.   Therapy/Group: Individual Therapy  [name], Haniyah Maciolek P 12/26/2017, 10:45 AM

## 2017-12-26 NOTE — Progress Notes (Signed)
Patient shaved ,Writer noted an area unsder the patient's chin approximately 2.5 cm in diameter where the skin is reddened, however it still blanches. New pads were ordered fopr the [place]en brace,

## 2017-12-27 ENCOUNTER — Inpatient Hospital Stay (HOSPITAL_COMMUNITY): Payer: Medicare HMO

## 2017-12-27 ENCOUNTER — Inpatient Hospital Stay (HOSPITAL_COMMUNITY): Payer: Medicare HMO | Admitting: Occupational Therapy

## 2017-12-27 LAB — BASIC METABOLIC PANEL
Anion gap: 6 (ref 5–15)
BUN: 41 mg/dL — AB (ref 6–20)
CALCIUM: 9 mg/dL (ref 8.9–10.3)
CO2: 26 mmol/L (ref 22–32)
CREATININE: 0.89 mg/dL (ref 0.61–1.24)
Chloride: 106 mmol/L (ref 101–111)
GFR calc Af Amer: >60 mL/min (ref >60)
GFR calc non Af Amer: >60 mL/min (ref >60)
GLUCOSE: 105 mg/dL — AB (ref 65–99)
Potassium: 4.3 mmol/L (ref 3.5–5.1)
Sodium: 138 mmol/L (ref 135–145)

## 2017-12-27 LAB — CBC
HCT: 28.7 % — ABNORMAL LOW (ref 39.0–52.0)
Hemoglobin: 8.9 g/dL — ABNORMAL LOW (ref 13.0–17.0)
MCH: 28.9 pg (ref 26.0–34.0)
MCHC: 31 g/dL (ref 30.0–36.0)
MCV: 93.2 fL (ref 78.0–100.0)
PLATELETS: 297 10*3/uL (ref 150–400)
RBC: 3.08 MIL/uL — ABNORMAL LOW (ref 4.22–5.81)
RDW: 16.5 % — AB (ref 11.5–15.5)
WBC: 5.8 10*3/uL (ref 4.0–10.5)

## 2017-12-27 LAB — GLUCOSE, CAPILLARY
Glucose-Capillary: 106 mg/dL — ABNORMAL HIGH (ref 65–99)
Glucose-Capillary: 109 mg/dL — ABNORMAL HIGH (ref 65–99)
Glucose-Capillary: 104 mg/dL — ABNORMAL HIGH (ref 65–99)
Glucose-Capillary: 111 mg/dL — ABNORMAL HIGH (ref 65–99)

## 2017-12-27 NOTE — Progress Notes (Signed)
Physical Therapy Session Note  Patient Details  Name: [name] [name] MRN: [id number] Date of Birth: 1945/11/12  Today's Date: 12/27/2017 tx 1: PT Individual Time: 1000-1045 PT Individual Time Calculation (min): 45 min   Short Term Goals: Week 1:  PT Short Term Goal 1 (Week 1): =LTG Week 2:     Skilled Therapeutic Interventions/Progress Updates:   TEDs donned previously.   Pain: none at rest; L posterior superior iliac spine( PSIS) with movement  Sit> stand with min assist.  Gait training on level tile without AD x 80' x 2.    Standing with LUE support on table for strengthening exs: 10 x 1 each minii squats, calf raises, R/L hip abduction, seated: R/L ankle DF.   Pt required several seated rest breaks between bouts.    Sit> stand from armchair to return to room required mod assist as pt took 1st step, with L foot, due to L knee buckling.  Pt reported that he is supposed to step with his R foot first.  Pt left resting in bed with all needs within reach.     Therapy Documentation Precautions:  Precautions Precautions: Fall, Cervical Type of Shoulder Precautions: in shoulder immobilizer for 6 weeks, pendulum ex to start after 6/17 Shoulder Interventions: Shoulder sling/immobilizer, At all times Required Braces or Orthoses: Cervical Brace, Sling Cervical Brace: Hard collar, At all times Restrictions Weight Bearing Restrictions: Yes RUE Weight Bearing: Non weight bearing Other Position/Activity Restrictions: R UE      tx 2: 1300-1415. 75 min individual tx Pain: "a little bit" L PSIS area, worse with movement' premdicated  Seated EOB> stand with significant extra time and min assist.  Stand pivot to w/c with min assist.  Strengthening activity- seated in w/c, using Kinetron with bil LEs at level 10 cm/sec x 27 cycles, x 2.  Seated se;f stretching R/ L hamstrings using footstool, x 30 seconds x 3.  Therapeutic activities in sitting to shift wt forward, and bias wt bearing  to RLE as he elevates hips , blocked practice .  Without use of LUE pt is less likely to experience  a "catch" near PSIS.   Gait training on level tile x 120' without AD, min guard assist.   Pt left resting in w/c with all needs within reach; wife present.  See Function Navigator for Current Functional Status.   Therapy/Group: Individual Therapy  [place]oll[place] Klee 12/27/2017, 5:07 PM

## 2017-12-27 NOTE — Progress Notes (Signed)
Occupational Therapy Session Note  Patient Details  Name: [name] LoudWilliam J Kuhnle MRN: [id number] Date of Birth: [birth year]  Today's Date: 12/27/2017 OT Individual Time: [id number] OT Individual Time Calculation (min): 73 min    Short Term Goals: Week 1:  OT Short Term Goal 1 (Week 1): STG=LTG d/t ELOS   Skilled Therapeutic Interventions/Progress Updates:    Upon entering the room, pt seated on EOB with wife present in room. Pt reports 3/10 pain in L lower back this session. Pt returning to supine with supervision and increased time. OT demonstrates to caregiver how to change hard collar pads. She returned demonstrations with min verbal cues for proper technique. Pt returned to EOB with supervision and engaged in bathing task with set up A for UB self care and steady assistance for balance when standing to wash buttocks and peri area. Caregiver demonstrated ability to don and doff R shoulder sling properly with increased time. OT provided education regarding placement of R UE between legs and leaning forward in order to increase ease with dressing R UE first. Pt demonstrated technique safely. Pt requesting ice on lower back and hip area prior to OT leaving the room. Ice applied. Call bell and all needed items within reach upon exiting the room.   Therapy Documentation Precautions:  Precautions Precautions: Fall, Cervical Type of Shoulder Precautions: in shoulder immobilizer for 6 weeks, pendulum ex to start after 6/17 Shoulder Interventions: Shoulder sling/immobilizer, At all times Required Braces or Orthoses: Cervical Brace, Sling Cervical Brace: Hard collar, At all times Restrictions Weight Bearing Restrictions: Yes RUE Weight Bearing: Non weight bearing Other Position/Activity Restrictions: R UE General:   Vital Signs:   Pain: Pain Assessment Pain Scale: 0-10 Pain Score: 5  Pain Intervention(s): Medication (See eMAR) ADL: ADL ADL Comments: Please see functional navigator Vision    Perception    Praxis   Exercises:   Other Treatments:    See Function Navigator for Current Functional Status.   Therapy/Group: Individual Therapy  [name], Marl Seago P 12/27/2017, 9:28 AM

## 2017-12-27 NOTE — Progress Notes (Signed)
[facility] PHYSICAL MEDICINE & REHABILITATION     PROGRESS NOTE    Subjective/Complaints: No new issues today.  Left low back/hip still is tender  ROS: Patient denies fever, rash, sore throat, blurred vision, nausea, vomiting, diarrhea, cough, shortness of breath or chest pain, joint or back pain, headache, or mood change.     Objective:  Dg Shoulder Right  Result Date: 12/27/2017 CLINICAL DATA:  Open reduction and internal fixation of right shoulder. EXAM: RIGHT SHOULDER - 2+ VIEW COMPARISON:  None. FINDINGS: Status post surgical internal fixation of mildly displaced proximal right humeral neck fracture. No acute fracture or dislocation is noted. Joint spaces are unremarkable. IMPRESSION: Status post surgical internal fixation of mildly displaced proximal right humeral neck fracture. No other significant abnormality seen. Electronically Signed   By: Lupita RaiderJames  Green Jr, M.D.   On: 12/27/2017 12:24   Recent Labs    12/25/17 0616 12/27/17 0615  WBC 6.8 5.8  HGB 9.4* 8.9*  HCT 30.0* 28.7*  PLT 360 297   Recent Labs    12/27/17 0615  NA 138  K 4.3  CL 106  GLUCOSE 105*  BUN 41*  CREATININE 0.89  CALCIUM 9.0   CBG (last 3)  Recent Labs    12/26/17 2115 12/27/17 0643 12/27/17 1133  GLUCAP 124* 106* 109*    Wt Readings from Last 3 Encounters:  12/20/17 108.8 kg (239 lb 13.8 oz)  01/24/17 64.4 kg (142 lb)  01/19/17 64.7 kg (142 lb 9.6 oz)     Intake/Output Summary (Last 24 hours) at 12/27/2017 1308 Last data filed at 12/27/2017 0825 Gross per 24 hour  Intake 120 ml  Output 250 ml  Net -130 ml    Vital Signs: Blood pressure 123/72, pulse 89, temperature 98.2 F (36.8 C), temperature source Oral, resp. rate 18, height 5\' 7"  (1.702 m), weight 108.8 kg (239 lb 13.8 oz), SpO2 99 %. Physical Exam:  Constitutional: No distress . Vital signs reviewed. HEENT: EOMI, oral membranes moist Neck: supple Cardiovascular: RRR without murmur. No JVD    Respiratory: CTA  Bilaterally without wheezes or rales. Normal effort    GI: BS +, non-tender, non-distended  Musc: RUE with ACE/dry. Left wrist wrapped -left low back, pelvic area sens to touch.  Neurological: He isalertand oriented to person, place, and time. Motor: left upper extremity: 4+/5 proximal to distal Right upper extremity: Shoulder abduction 1/5, elbow brace, and grip 3/5 Left lower extremity: Hip flexion 4-/5, knee extension 4/5, ankle dorsiflexion 4+/5 Sensation intact to light touch. Exam stable  alert and appropriate--at cognitive baseline Skin:  Abrasions/incisions stable RUE Psychiatric: He has a normal mood and affect. His behavior is normal.        Assessment/Plan: 1. 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 Physiatrist is providing close team supervision and 24 hour management of active medical problems listed below. Physiatrist and rehab team continue to assess barriers to discharge/monitor patient progress toward functional and medical goals.  Function:  Bathing Bathing position   Position: Sitting EOB  Bathing parts Body parts bathed by patient: Chest, Abdomen, Front perineal area, Right upper leg, Left upper leg, Buttocks, Right lower leg, Left lower leg, Left arm Body parts bathed by helper: Back  Bathing assist Assist Level: Touching or steadying assistance(Pt > 75%)      Upper Body Dressing/Undressing Upper body dressing   What is the patient wearing?: Hospital gown  Pull over shirt/dress - Perfomed by patient: Pull shirt over trunk Pull over shirt/dress - Perfomed by helper: Thread/unthread right sleeve, Put head through opening, Thread/unthread left sleeve        Upper body assist Assist Level: Touching or steadying assistance(Pt > 75%)      Lower Body Dressing/Undressing Lower body dressing   What is the patient wearing?: Ted  Hose, Non-skid slipper socks     Pants- Performed by patient: Pull pants up/down Pants- Performed by helper: Thread/unthread right pants leg, Thread/unthread left pants leg   Non-skid slipper socks- Performed by helper: Don/doff right sock, Don/doff left sock               TED Hose - Performed by helper: Don/doff right TED hose, Don/doff left TED hose  Lower body assist Assist for lower body dressing: (total assist, 20%)      Toileting Toileting Toileting activity did not occur: No continent bowel/bladder event   Toileting steps completed by helper: Adjust clothing prior to toileting, Performs perineal hygiene, Adjust clothing after toileting Toileting Assistive Devices: Grab bar or rail  Toileting assist Assist level: Touching or steadying assistance (Pt.75%)   Transfers Chair/bed transfer   Chair/bed transfer method: Stand pivot Chair/bed transfer assist level: Touching or steadying assistance (Pt > 75%) Chair/bed transfer assistive device: Armrests, Bedrails     Locomotion Ambulation     Max distance: 200' Assist level: Touching or steadying assistance (Pt > 75%)   Wheelchair   Type: Manual Max wheelchair distance: >300' Assist Level: Supervision or verbal cues  Cognition Comprehension Comprehension assist level: Follows complex conversation/direction with extra time/assistive device  Expression Expression assist level: Expresses complex ideas: With extra time/assistive device  Social Interaction Social Interaction assist level: Interacts appropriately with others with medication or extra time (anti-anxiety, antidepressant).  Problem Solving Problem solving assist level: Solves complex problems: With extra time  Memory Memory assist level: More than reasonable amount of time   Medical Problem List and Plan: 1.  Weakness, limited endurance, limitation in self-care secondary to polytrauma with TBI.  -continue therapies 2.  DVT Prophylaxis/Anticoagulation:  Pharmaceutical: Lovenox 3. Pain Management: Managed with tylenol and robaxin  -reduced robaxin to 250mg -500mg  dosing to see if lower dose still helps spasms  -corset to support back  4. Mood: LCSW to follow for evaluation and support.  5. Neuropsych: This patient is capable of making decisions on his own behalf. 6. Skin/Wound Care: non-adherent dressing to open areas right arm, kerlex 7. Fluids/Electrolytes/Nutrition: encourage PO  -protein supp 8.  Seizure prophylaxis: On keppra 9. HTN: Monitor BP bid--continue Cozaar and metoprolol.  10. Constipation: Increased miralax to bid- needs bm 11. COPD: Resumed Combivent bid. Encourage IS while awake. 12. ABLA:  hgb 8.3 6/13. No signs of blood loss  -recheck up to 9.4 6/17 13. Right proximal humerus fracture s/p ORIF 6/10: NWB RUE  -begin pendulum exercises as tolerated. 14. OSA?: Untreated. Encouraged patient to follow up with MD for testing after discharge.  15. T2DM: Monitor BS ac/hs. Resumed metformin.   SSI for elevated BS/thigter control to help promote wound healing.   -sugars well controlled 6/19 16. Thrombocytopenia: resolved    LOS (Days) 7 A FACE TO FACE EVALUATION WAS PERFORMED  Ranelle Oyster, MD 12/27/2017 1:08 PM

## 2017-12-28 ENCOUNTER — Inpatient Hospital Stay (HOSPITAL_COMMUNITY): Payer: Medicare HMO | Admitting: Physical Therapy

## 2017-12-28 ENCOUNTER — Inpatient Hospital Stay (HOSPITAL_COMMUNITY): Payer: Medicare HMO | Admitting: Occupational Therapy

## 2017-12-28 LAB — GLUCOSE, CAPILLARY
GLUCOSE-CAPILLARY: 127 mg/dL — AB (ref 65–99)
Glucose-Capillary: 94 mg/dL (ref 65–99)
Glucose-Capillary: 103 mg/dL — ABNORMAL HIGH (ref 65–99)
Glucose-Capillary: 118 mg/dL — ABNORMAL HIGH (ref 65–99)

## 2017-12-28 MED ORDER — LIDOCAINE 5 % EX PTCH
1.0000 | MEDICATED_PATCH | CUTANEOUS | Status: DC
  Administered 2017-12-29 – 2018-01-02 (×5): 1 via TRANSDERMAL
  Filled 2017-12-28 (×5): qty 1

## 2017-12-28 NOTE — Progress Notes (Signed)
Occupational Therapy Weekly Progress Note  Patient Details  Name: [name] [name] MRN: [id number] Date of Birth: 25-Feb-1946  Beginning of progress report period: December 21, 2017 End of progress report period: December 28, 2017  Today's Date: 12/28/2017 OT Individual Time: 6979-4801 OT Individual Time Calculation (min): 61 min    Patient has met  0 of 8 long term goals.  Short term goals not set due to estimated length of stay.  Pt's LTGs are set at supervision, but he is currently needing touching A due to difficulty with balance due to back pain.  Pt's LOS set longer than anticipated to allow for more time for pain control.  Patient continues to demonstrate the following deficits: muscle weakness, decreased cardiorespiratoy endurance and decreased standing balance and therefore will continue to benefit from skilled OT intervention to enhance overall performance with BADL.  See Patient's Care Plan for progression toward long term goals.  Patient progressing toward long term goals..  Plan of care revisions: the LTG for shower transfers was discontinued as pt is not showering at this time..  Skilled Therapeutic Interventions/Progress Updates:    Pt received in bed with spouse in the room. His spouse stated she would like to review again from yesterday the replacing of pads on his C collar.  Demonstrated with return demonstration.  Pt stated he would like to wash his hair but did not want to bathe today,  Pt continues to want to stay in gown as he does not want to get muscle rub on his clothing.  Pt sat to EOB slowly with touching A.  Pt tolerated sitting unsupported for 20-30 min while having his hair washed.  Pt worked on sit to stand with touching A.  Pt did have increased back pain especially on his L side.  Pt then worked on sit to supine on flat bed without rails to practice for home.  Min guard to lift legs on bed first then lower down using LUE.   From supine, pt worked on low back exercises of  pelvic tilts, pelvic lateral rocks, knee sways with bend knees.  Pt tolerated exercises well,  For deep pressure massage, pt used tennis ball under tight spots on low back rolling back and forth.  Pt felt this was comfortable and liked the sensation of the deep pressure. Pt advised to do low back exercises for 5 min prior to getting out of bed and to use tennis ball as needed. Pt in room with spouse, all needs met.  Therapy Documentation Precautions:  Precautions Precautions: Fall, Cervical Type of Shoulder Precautions: in shoulder immobilizer for 6 weeks, pendulum ex to start after 6/17 Shoulder Interventions: Shoulder sling/immobilizer, At all times Required Braces or Orthoses: Cervical Brace, Sling Cervical Brace: Hard collar, At all times Restrictions Weight Bearing Restrictions: Yes RUE Weight Bearing: Non weight bearing Other Position/Activity Restrictions: R UE   Pain: 6/10 low back with movement, pt was premedicated and has a pain patch on   ADL: ADL ADL Comments: Please see functional navigator  See Function Navigator for Current Functional Status.   Therapy/Group: Individual Therapy  [place] 12/28/2017, 12:18 PM

## 2017-12-28 NOTE — Progress Notes (Signed)
[facility] PHYSICAL MEDICINE & REHABILITATION     PROGRESS NOTE    Subjective/Complaints: No new issues today.  Left low back/hip still is tender  ROS: Patient denies fever, rash, sore throat, blurred vision, nausea, vomiting, diarrhea, cough, shortness of breath or chest pain, joint or back pain, headache, or mood change.     Objective:  Dg Shoulder Right  Result Date: 12/27/2017 CLINICAL DATA:  Open reduction and internal fixation of right shoulder. EXAM: RIGHT SHOULDER - 2+ VIEW COMPARISON:  None. FINDINGS: Status post surgical internal fixation of mildly displaced proximal right humeral neck fracture. No acute fracture or dislocation is noted. Joint spaces are unremarkable. IMPRESSION: Status post surgical internal fixation of mildly displaced proximal right humeral neck fracture. No other significant abnormality seen. Electronically Signed   By: Lupita Raider, M.D.   On: 12/27/2017 12:24   Recent Labs    12/27/17 0615  WBC 5.8  HGB 8.9*  HCT 28.7*  PLT 297   Recent Labs    12/27/17 0615  NA 138  K 4.3  CL 106  GLUCOSE 105*  BUN 41*  CREATININE 0.89  CALCIUM 9.0   CBG (last 3)  Recent Labs    12/27/17 2053 12/28/17 0650 12/28/17 1113  GLUCAP 111* 94 103*    Wt Readings from Last 3 Encounters:  12/20/17 108.8 kg (239 lb 13.8 oz)  01/24/17 64.4 kg (142 lb)  01/19/17 64.7 kg (142 lb 9.6 oz)     Intake/Output Summary (Last 24 hours) at 12/28/2017 1225 Last data filed at 12/27/2017 1300 Gross per 24 hour  Intake 120 ml  Output -  Net 120 ml    Vital Signs: Blood pressure 119/77, pulse 87, temperature 98.2 F (36.8 C), resp. rate 18, height 5\' 7"  (1.702 m), weight 108.8 kg (239 lb 13.8 oz), SpO2 97 %. Physical Exam:  Constitutional: No distress . Vital signs reviewed. HEENT: EOMI, oral membranes moist Neck: supple Cardiovascular: RRR without murmur. No JVD    Respiratory: CTA Bilaterally without wheezes or rales. Normal effort    GI: BS +,  non-tender, non-distended  Musc: RUE with ACE/dry. Left wrist wrapped -left low back, pelvic area sens to touch.  Neurological: He isalertand oriented to person, place, and time. Motor: left upper extremity: 4+/5 proximal to distal Right upper extremity: Shoulder abduction 1/5, elbow brace, and grip 3/5 Left lower extremity: Hip flexion 4-/5, knee extension 4/5, ankle dorsiflexion 4+/5 Sensation intact to light touch. Exam stable  alert and appropriate--at cognitive baseline Skin:  Abrasions/incisions stable RUE Psychiatric: He has a normal mood and affect. His behavior is normal.        Assessment/Plan: 1. 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 Physiatrist is providing close team supervision and 24 hour management of active medical problems listed below. Physiatrist and rehab team continue to assess barriers to discharge/monitor patient progress toward functional and medical goals.  Function:  Bathing Bathing position   Position: Sitting EOB  Bathing parts Body parts bathed by patient: Chest, Abdomen, Front perineal area, Right upper leg, Left upper leg, Buttocks, Right lower leg, Left lower leg, Left arm Body parts bathed by helper: Back  Bathing assist Assist Level: Touching or steadying assistance(Pt > 75%)      Upper Body Dressing/Undressing Upper body dressing   What is the patient wearing?: Hospital gown     Pull over shirt/dress - Perfomed by patient: Pull  shirt over trunk Pull over shirt/dress - Perfomed by helper: Thread/unthread right sleeve, Put head through opening, Thread/unthread left sleeve        Upper body assist Assist Level: Touching or steadying assistance(Pt > 75%)      Lower Body Dressing/Undressing Lower body dressing   What is the patient wearing?: Ted Hose, Non-skid slipper socks     Pants- Performed by patient:  Pull pants up/down Pants- Performed by helper: Thread/unthread right pants leg, Thread/unthread left pants leg   Non-skid slipper socks- Performed by helper: Don/doff right sock, Don/doff left sock               TED Hose - Performed by helper: Don/doff right TED hose, Don/doff left TED hose  Lower body assist Assist for lower body dressing: (total assist, 20%)      Toileting Toileting Toileting activity did not occur: No continent bowel/bladder event   Toileting steps completed by helper: Adjust clothing prior to toileting, Performs perineal hygiene, Adjust clothing after toileting Toileting Assistive Devices: Grab bar or rail  Toileting assist Assist level: Touching or steadying assistance (Pt.75%)   Transfers Chair/bed transfer   Chair/bed transfer method: Stand pivot Chair/bed transfer assist level: Touching or steadying assistance (Pt > 75%) Chair/bed transfer assistive device: Armrests, Bedrails     Locomotion Ambulation     Max distance: 200' Assist level: Touching or steadying assistance (Pt > 75%)   Wheelchair   Type: Manual Max wheelchair distance: >300' Assist Level: Supervision or verbal cues  Cognition Comprehension Comprehension assist level: Follows complex conversation/direction with extra time/assistive device  Expression Expression assist level: Expresses complex ideas: With extra time/assistive device  Social Interaction Social Interaction assist level: Interacts appropriately with others with medication or extra time (anti-anxiety, antidepressant).  Problem Solving Problem solving assist level: Solves complex problems: With extra time  Memory Memory assist level: More than reasonable amount of time   Medical Problem List and Plan: 1.  Weakness, limited endurance, limitation in self-care secondary to polytrauma with TBI.  -continue therapies 2.  DVT Prophylaxis/Anticoagulation: Pharmaceutical: Lovenox 3. Pain Management: Managed with tylenol and  robaxin  -reduced robaxin to 250mg -500mg  dosing to see if lower dose still helps spasms  -corset to support back   -consider xray lumbar spine if no improvement tomorrow. ?MRI, further imaging of low back as outpt.  4. Mood: LCSW to follow for evaluation and support.  5. Neuropsych: This patient is capable of making decisions on his own behalf. 6. Skin/Wound Care: non-adherent dressing to open areas right arm, kerlex 7. Fluids/Electrolytes/Nutrition: encourage PO  -protein supp 8.  Seizure prophylaxis: On keppra 9. HTN: Monitor BP bid--continue Cozaar and metoprolol.  10. Constipation: Increased miralax to bid- needs bm 11. COPD: Resumed Combivent bid. Encourage IS while awake. 12. ABLA:  hgb 8.3 6/13. No signs of blood loss  -recheck 8/9 6/20 13. Right proximal humerus fracture s/p ORIF 6/10: NWB RUE  -begin pendulum exercises as tolerated. 14. OSA?: Untreated. Encouraged patient to follow up with MD for testing after discharge.  15. T2DM: Monitor BS ac/hs. Resumed metformin.   SSI for elevated BS/thigter control to help promote wound healing.   -sugars well controlled 6/20 16. Thrombocytopenia: resolved    LOS (Days) 8 A FACE TO FACE EVALUATION WAS PERFORMED  Ranelle OysterZachary T Swartz, MD 12/28/2017 12:25 PM

## 2017-12-28 NOTE — Progress Notes (Addendum)
Physical Therapy Weekly Progress Note  Patient Details  Name: [name] [name] MRN: [id number] Date of Birth: Nov 10, 1945  Beginning of progress report period: December 21, 2017 End of progress report period: December 28, 2017  Today's Date: 12/28/2017 PT Individual Time: 0830-0911 PT Individual Time Calculation (min): 41 min   Pt in bed agreeable to therapy.  Stand pivot transfers throughout session with increased time and encouragement, min A due to Lt side pain.  Pt performs sit to supine on mat with increased time and supervision.  Supine with LEs on ball for LTR and hip/knee flex/ext to stretch low back with pt feeling some relief.  Supine to sit requires min A and min cuing for technique.  Gait without AD 50', 100' with close supervision.  Pt still with small step length and wide BOS.  Pt left in room with needs at hand, wife present.  Session 2: 1300 Pt and wife refused 90 minute PT session due to pt "falling over" due to Lt side/back pain during lunch. Pt's wife reports she is requesting and MRI and does not want to proceed with therapy until that is done.   Patient is progressing towards long term goals of supervision overall. Pt is supervision for gait, continues to require min A for bed mobility and transfers due to Lt side/low back pain.  Patient continues to demonstrate the following deficits muscle weakness and decreased standing balance, decreased postural control and decreased balance strategies and therefore will continue to benefit from skilled PT intervention to increase functional independence with mobility.  Patient progressing toward long term goals..  Continue plan of care.  PT Short Term Goals Week 1:  PT Short Term Goal 1 (Week 1): =LTG Week 2:  PT Short Term Goal 1 (Week 2): = LTG  Skilled Therapeutic Interventions/Progress Updates:  Ambulation/gait training;Discharge planning;Functional mobility training;Therapeutic Activities;Balance/vestibular training;Neuromuscular  re-education;Therapeutic Exercise;Wheelchair propulsion/positioning;UE/LE Strength taining/ROM;Splinting/orthotics;Pain management;DME/adaptive equipment instruction;Cognitive remediation/compensation;Community reintegration;Patient/family education;Stair training;UE/LE Coordination activities   Therapy Documentation Precautions:  Precautions Precautions: Fall, Cervical Type of Shoulder Precautions: in shoulder immobilizer for 6 weeks, pendulum ex to start after 6/17 Shoulder Interventions: Shoulder sling/immobilizer, At all times Required Braces or Orthoses: Cervical Brace, Sling Cervical Brace: Hard collar, At all times Restrictions Weight Bearing Restrictions: Yes RUE Weight Bearing: Non weight bearing Other Position/Activity Restrictions: R UE Pain: Pain Assessment Pain Score: 7 on Lt side/low back, RN aware and meds given during session   See Function Navigator for Current Functional Status.  Therapy/Group: Individual Therapy  DONAWERTH,KAREN 12/28/2017, 9:12 AM

## 2017-12-29 ENCOUNTER — Inpatient Hospital Stay (HOSPITAL_COMMUNITY): Payer: Medicare HMO | Admitting: Occupational Therapy

## 2017-12-29 ENCOUNTER — Inpatient Hospital Stay (HOSPITAL_COMMUNITY): Payer: Medicare HMO | Admitting: Physical Therapy

## 2017-12-29 ENCOUNTER — Inpatient Hospital Stay (HOSPITAL_COMMUNITY): Payer: Medicare HMO

## 2017-12-29 DIAGNOSIS — R0781 Pleurodynia: Secondary | ICD-10-CM

## 2017-12-29 LAB — GLUCOSE, CAPILLARY
GLUCOSE-CAPILLARY: 109 mg/dL — AB (ref 65–99)
GLUCOSE-CAPILLARY: 109 mg/dL — AB (ref 65–99)
GLUCOSE-CAPILLARY: 118 mg/dL — AB (ref 65–99)
GLUCOSE-CAPILLARY: 125 mg/dL — AB (ref 65–99)

## 2017-12-29 NOTE — Progress Notes (Signed)
Addendum:  I reviewed lumbar xrays and discussed with patient. L2 deformity appears old and does not correlate with his pain. Continue with current plan for pain management.    Ranelle OysterZachary T. Raneshia Derick, MD, [facility] 12/29/2017

## 2017-12-29 NOTE — Progress Notes (Signed)
Occupational Therapy Session Note  Patient Details  Name: [name] [name] MRN: [id number] Date of Birth: 1946-04-10  Today's Date: 12/29/2017 OT Individual Time: [id number] and 1405-1502 OT Individual Time Calculation (min): 72 min and 57 min  Short Term Goals: Week 1:  OT Short Term Goal 1 (Week 1): STG=LTG d/t ELOS   Skilled Therapeutic Interventions/Progress Updates:    Pt greeted EOB with spouse present. Checked with MD prior to session for therapy restrictions due to persistent Lt flank pain/x-ray today. Received instruction to proceed with therapy per pts tolerance and to start pendulum exercises. Started session with adaptive dressing skills, with pt donning shirt, pants and gripper socks with supervision and cues for adaptive techniques in order to maintain precautions. Had pt elevate LEs onto bed for safely reaching feet while doffing/donning footwear. Spouse present, taking notes throughout session. She doffed/donned sling at appropriate times during session with min vcs as well. Pt ambulated short distance to w/c with Min A and ice pack strapped to Lt flank. Sling was then doffed and he completed pendulum exercises at edge of chair, leaning forward onto table with L UE. Instructed pt to use trunk to facilitate gentle passive circles, anterior/posterior and lateral motions. Pt reported no pain during these exercises. OT also applied ice pack to Lt shoulder due to soreness. Instructed him on gentle unilateral shoulder exercises to decrease tension. At end of session pt was left in w/c with spouse present.   2nd Session 1:1 tx (57 min)  Pt greeted in w/c with spouse present. Pain manageable with use of ice pack to Lt flank. Escorted pt outdoors in w/c. Tx focus on functional ambulation and general strengthening for carryover during self care tasks. He ambulated without device and steady assist for two laps around the fountain, negotiating uneven terrain and avoiding environmental barriers. Pt  navigating similar environment when propelling in w/c with L UE and bilateral LEs for two laps as well. D[facility]ed DME needs with pt and spouse, [facility]. At end of tx pt was escorted back to room and left with spouse and fresh ice pack for Lt ribs/flank.   Therapy Documentation Precautions:  Precautions Precautions: Fall, Cervical Type of Shoulder Precautions: in shoulder immobilizer for 6 weeks, pendulum ex to start after 6/17 Shoulder Interventions: Shoulder sling/immobilizer, At all times Required Braces or Orthoses: Cervical Brace, Sling Cervical Brace: Hard collar, At all times Restrictions Weight Bearing Restrictions: Yes RUE Weight Bearing: Non weight bearing Other Position/Activity Restrictions: R UE Pain: Pain Assessment Pain Scale: 0-10 Pain Score: 0-No pain ADL: ADL ADL Comments: Please see functional navigator     See Function Navigator for Current Functional Status.   Therapy/Group: Individual Therapy  [name] [name] 12/29/2017, 12:31 PM
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
## 2017-12-29 NOTE — Progress Notes (Signed)
Physical Therapy Session Note  Patient Details  Name: QUY LOTTS MRN: [id number] Date of Birth: Jun 14, 1946  Today's Date: 12/29/2017 PT Individual Time: 4712-5271 PT Individual Time Calculation (min): 50 min   Short Term Goals: Week 2:  PT Short Term Goal 1 (Week 2): = LTG  Skilled Therapeutic Interventions/Progress Updates:   Pt in w/c and agreeable to therapy, reports pain in back is 15/10 but pt sitting calming, conversing w/ his wife. Pt exacerbated by transitional movements. Session focused on overall endurance, tolerance to standing, and LE strengthening. Performed standing reaching tasks w/ clothespins in 1-2 min bouts. Performed BLE strengthening exercises during seated rest breaks including knee marches, LAQs, and heel slides. Pt w/ many questions regarding possible rib fracture per pt conversation w/ MD. Venita Lick pt on movements that may exacerbate rib pain including open chain LE exercises requiring core stabilization. However discussed importance of staying mobile, within MD precautions, to help w/ bone healing. Pt verbalized understanding. Pt self-propelled w/c back to room using BLEs. Returned to room and ended session in w/c and in care of wife, all needs met. Ice applied to lower back for pain relief.   Therapy Documentation Precautions:  Precautions Precautions: Fall, Cervical Type of Shoulder Precautions: in shoulder immobilizer for 6 weeks, pendulum ex to start after 6/17 Shoulder Interventions: Shoulder sling/immobilizer, At all times Required Braces or Orthoses: Cervical Brace, Sling Cervical Brace: Hard collar, At all times Restrictions Weight Bearing Restrictions: Yes RUE Weight Bearing: Non weight bearing Other Position/Activity Restrictions: R UE  See Function Navigator for Current Functional Status.   Therapy/Group: Individual Therapy  Ashleyann Shoun K Arnette 12/29/2017, 1:59 PM

## 2017-12-30 ENCOUNTER — Inpatient Hospital Stay (HOSPITAL_COMMUNITY): Payer: Medicare HMO | Admitting: Occupational Therapy

## 2017-12-30 LAB — GLUCOSE, CAPILLARY
GLUCOSE-CAPILLARY: 109 mg/dL — AB (ref 65–99)
Glucose-Capillary: 99 mg/dL (ref 65–99)
Glucose-Capillary: 122 mg/dL — ABNORMAL HIGH (ref 65–99)
Glucose-Capillary: 126 mg/dL — ABNORMAL HIGH (ref 65–99)
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
## 2017-12-30 NOTE — Progress Notes (Signed)
Occupational Therapy Session Note  Patient Details  Name: [name] [name] MRN: [id number] Date of Birth: January 17, 1946  Today's Date: 12/30/2017 OT Individual Time: 1005-1100 OT Individual Time Calculation (min): 55 min   Skilled Therapeutic Interventions/Progress Updates:    Pt greeted EOB with spouse present. ADL needs met. Wanted to work on Rt arm. Stand pivot<w/c completed with steady assist. Educated spouse on proper technique for donning abdominal binder (for decreasing pts rib pain). Jocelyn Lamer also donned pts sling and Teds with min vcs during session. Had her don Teds EOB while propping his legs up on her thighs for decreasing back strain. Afterwards pt was taken to dayroom and was instructed on method for completing gentle pendulum exercises in standing and sitting positions. Ice pack applied to Lt shoulder to decrease pain/soreness. At end of session pt was taken back to room and left with spouse and ice pack remaining on shoulder.   Therapy Documentation Precautions:  Precautions Precautions: Fall, Cervical Type of Shoulder Precautions: in shoulder immobilizer for 6 weeks, pendulum ex to start after 6/17 Shoulder Interventions: Shoulder sling/immobilizer, At all times Required Braces or Orthoses: Cervical Brace, Sling Cervical Brace: Hard collar, At all times Restrictions Weight Bearing Restrictions: Yes RUE Weight Bearing: Non weight bearing Other Position/Activity Restrictions: R UE Vital Signs: Oxygen Therapy SpO2: 96 % O2 Device: Room Air Pain: Pain Assessment Pain Scale: 0-10 Pain Score: 6  Pain Type: Acute pain Pain Location: Head Pain Descriptors / Indicators: Aching Pain Onset: On-going Pain Intervention(s): Medication (See eMAR) ADL: ADL ADL Comments: Please see functional navigator    See Function Navigator for Current Functional Status.   Therapy/Group: Individual Therapy  Renata Gambino A Michell Giuliano 12/30/2017, 12:28 PM

## 2017-12-31 ENCOUNTER — Inpatient Hospital Stay (HOSPITAL_COMMUNITY): Payer: Medicare HMO

## 2017-12-31 LAB — GLUCOSE, CAPILLARY
GLUCOSE-CAPILLARY: 102 mg/dL — AB (ref 65–99)
GLUCOSE-CAPILLARY: 104 mg/dL — AB (ref 65–99)
GLUCOSE-CAPILLARY: 103 mg/dL — AB (ref 65–99)
Glucose-Capillary: 106 mg/dL — ABNORMAL HIGH (ref 65–99)
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
## 2017-12-31 NOTE — Progress Notes (Signed)
Occupational Therapy Session Note  Patient Details  Name: [name] [name] MRN: [id number] Date of Birth: 1946/05/20  Today's Date: 12/31/2017 OT Individual Time: [id number] OT Individual Time Calculation (min): 28 min    Short Term Goals: Week 1:  OT Short Term Goal 1 (Week 1): STG=LTG d/t ELOS   Skilled Therapeutic Interventions/Progress Updates:    1:1. Pt with 4/10 pain in back but willing to participate in tx and not alert RN. Pt ambulates part way to/from all tx destinations with superviison. Pt completes standing bean bag toss on compliant surface and foam wedge to challenge balance and reactions. Pt with no LOB reaching laterally and crossing midline for bean bags. Pt completes TTB transfer from ambulatory level with supervisiona nd VC for sequencing. Exited session with pt seated in w/c, call light in reach and wife in room  Therapy Documentation Precautions:  Precautions Precautions: Fall, Cervical Type of Shoulder Precautions: in shoulder immobilizer for 6 weeks, pendulum ex to start after 6/17 Shoulder Interventions: Shoulder sling/immobilizer, At all times Required Braces or Orthoses: Cervical Brace, Sling Cervical Brace: Hard collar, At all times Restrictions Weight Bearing Restrictions: Yes RUE Weight Bearing: Non weight bearing Other Position/Activity Restrictions: R UE  See Function Navigator for Current Functional Status.   Therapy/Group: Individual Therapy  Shon HaleStephanie M Yaa Donnellan 12/31/2017, 1:31 PM

## 2018-01-01 ENCOUNTER — Inpatient Hospital Stay (HOSPITAL_COMMUNITY): Payer: Medicare HMO | Admitting: Physical Therapy

## 2018-01-01 ENCOUNTER — Inpatient Hospital Stay (HOSPITAL_COMMUNITY): Payer: Medicare HMO | Admitting: Occupational Therapy

## 2018-01-01 LAB — GLUCOSE, CAPILLARY
GLUCOSE-CAPILLARY: 94 mg/dL (ref 65–99)
GLUCOSE-CAPILLARY: 99 mg/dL (ref 65–99)
Glucose-Capillary: 86 mg/dL (ref 65–99)
Glucose-Capillary: 104 mg/dL — ABNORMAL HIGH (ref 65–99)

## 2018-01-01 MED ORDER — ZINC SULFATE 220 (50 ZN) MG PO CAPS: 220.0000 mg | ORAL_CAPSULE | Freq: Every day | ORAL | 0 refills | Status: AC

## 2018-01-01 MED ORDER — LOSARTAN POTASSIUM 50 MG PO TABS: 50.0000 mg | ORAL_TABLET | ORAL | 0 refills | Status: DC

## 2018-01-01 MED ORDER — METFORMIN HCL 500 MG PO TABS: 500.0000 mg | ORAL_TABLET | Freq: Two times a day (BID) | ORAL | 0 refills | Status: DC

## 2018-01-01 MED ORDER — LEVETIRACETAM 500 MG PO TABS: 500.0000 mg | ORAL_TABLET | Freq: Two times a day (BID) | ORAL | 0 refills | Status: DC

## 2018-01-01 MED ORDER — METHOCARBAMOL 500 MG PO TABS: 500.0000 mg | ORAL_TABLET | Freq: Four times a day (QID) | ORAL | 0 refills | Status: AC | PRN

## 2018-01-01 MED ORDER — PANTOPRAZOLE SODIUM 40 MG PO TBEC: 40.0000 mg | DELAYED_RELEASE_TABLET | ORAL | 0 refills | Status: DC

## 2018-01-01 MED ORDER — TAB-A-VITE/IRON PO TABS: 1.0000 | ORAL_TABLET | Freq: Every day | ORAL | 0 refills | Status: AC

## 2018-01-01 MED ORDER — METOPROLOL SUCCINATE ER 25 MG PO TB24: 25.0000 mg | ORAL_TABLET | ORAL | 0 refills | Status: DC

## 2018-01-01 MED ORDER — LIDOCAINE 5 % EX PTCH: 1.0000 | MEDICATED_PATCH | CUTANEOUS | 0 refills | Status: DC

## 2018-01-01 NOTE — Progress Notes (Signed)
Physical Therapy Discharge Summary  Patient Details  Name: [name] [name] MRN: [id number] Date of Birth: Oct 08, 1945  Today's Date: 01/01/2018 PT Individual Time: [phone] and 8727-6184 PT Individual Time Calculation (min): 55 min and 24 min  Pt performs gait throughout unit in home and controlled environments with supervision.  Stair negotiation with 1 railing x 12 steps with supervision, pt able to do reciprocal stepping.  Curb step negotiation with supervision to simulate home entry.  Simulated car transfer with supervision, improved safety awareness.  Gait on uneven surface and ramp with supervision, encouraged pt to go outside with wife as this is something he enjoys.  Bed mobility and transfers in ADL apartment all with supervision.  Nustep for bilat LE strengthening x 8 minutes level 5.  Pt and wife state they feel comfortable for d/c home tomorrow at this level of care.  Session 2: Berg balance test performed, pt improved from 23/56 to 38/56. Pt aware of continuing balance deficits and PT recommendation for supervision with stairs and all outdoor and community gait. Pt and wife educated on importance of continued physical activity at home and discussed energy conservation and home safety.  Pt and wife verbalize understanding.  Patient has met 7 of 7 long term goals due to improved activity tolerance, improved balance, increased strength, decreased pain and ability to compensate for deficits.  Patient to discharge at an ambulatory level Supervision.   Patient's care partner is independent to provide the necessary supervision assistance at discharge.  Reasons goals not met: n/a  Recommendation:  Patient will benefit from ongoing skilled PT services in home health setting to continue to advance safe functional mobility, address ongoing impairments in balance, strength, gait, and minimize fall risk.  Equipment: No equipment provided  Reasons for discharge: treatment goals met and  discharge from hospital  Patient/family agrees with progress made and goals achieved: Yes  PT Discharge Precautions/Restrictions Precautions Precautions: Fall;Cervical Type of Shoulder Precautions: in shoulder immobilizer for 6 weeks, pendulum ex to start after 6/17 Shoulder Interventions: Shoulder sling/immobilizer;At all times Required Braces or Orthoses: Cervical Brace;Sling Cervical Brace: Hard collar;At all times Restrictions RUE Weight Bearing: Non weight bearing Pain Pain Assessment Pain Scale: 0-10 Pain Score: 4  Pain Type: Acute pain Pain Location: Back Pain Orientation: Mid;Lower;Left Pain Descriptors / Indicators: Aching Pain Frequency: Intermittent Pain Onset: Gradual Patients Stated Pain Goal: 2 Pain Intervention(s): RN made aware;Repositioned Multiple Pain Sites: No  Cognition Overall Cognitive Status: Within Functional Limits for tasks assessed Arousal/Alertness: Awake/alert Sensation Sensation Light Touch: Appears Intact Proprioception: Appears Intact Coordination Gross Motor Movements are Fluid and Coordinated: Yes Motor  Motor Motor - Discharge Observations: improving strength  Mobility Bed Mobility Sit to Supine: Independent Transfers Sit to Stand: Supervision/Verbal cueing Stand Pivot Transfers: Supervision/Verbal cueing Locomotion  Gait Gait Assistance: Supervision/Verbal cueing Gait Distance (Feet): 200 Feet Assistive device: None Stairs / Additional Locomotion Stairs Assistance: Supervision/Verbal cueing Stair Management Technique: One rail Left Number of Stairs: 12 Ramp: Supervision/Verbal cueing  Trunk/Postural Assessment  Cervical Assessment Cervical Assessment: (collar) Thoracic Assessment Thoracic Assessment: Within Functional Limits Lumbar Assessment Lumbar Assessment: (posterior pelvic tilt) Postural Control Righting Reactions: delayed  Balance  Berg balance test 38/56 Extremity Assessment      RLE Assessment General  Strength Comments: grossly 4/5 LLE Assessment General Strength Comments: grossly 4/5   See Function Navigator for Current Functional Status.  DONAWERTH,KAREN 01/01/2018, 9:11 AM
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
## 2018-01-01 NOTE — Discharge Instructions (Signed)
Inpatient Rehab Discharge Instructions  [name] LoudWilliam J [name] Discharge date and time:  01/02/18  Activities/Precautions/ Functional Status: Activity: Activity as tolerated. No weight on Right arm.  Diet: diabetic diet Wound Care: Wash with soap and water. Pat dry and keep wound keep and dry.     Functional status:  ___ No restrictions     ___ Walk up steps independently _X__ 24/7 supervision/assistance   ___ Walk up steps with assistance ___ Intermittent supervision/assistance  ___ Bathe/dress independently ___ Walk with walker     _X__ Bathe/dress with assistance ___ Walk Independently    ___ Shower independently ___ Walk with assistance    ___ Shower with assistance _X__ No alcohol     ___ Return to work/school ________     COMMUNITY REFERRALS UPON DISCHARGE:    Home Health:   PT     OT                      Agency:  [facility] Phone: [phone]   Medical Equipment/Items Ordered: bedside commode and tub bench                                                     Agency[facility]Supplier:  [facility] [phone]        Special Instructions: 1. No weight on Right arm. Wear sling for support.  2. Needs to follow up with primary MD for sleep study/work up sleep apnea.   My questions have been answered and I understand these instructions. I will adhere to these goals and the provided educational materials after my discharge from the hospital.  Patient/Caregiver Signature _______________________________ Date __________  Clinician Signature _______________________________________ Date __________  Please bring this form and your medication list with you to all your follow-up doctor's appointments.

## 2018-01-01 NOTE — Progress Notes (Signed)
Occupational Therapy Discharge Summary  Patient Details  Name: [name] [name] MRN: [id number] Date of Birth: [birth year]  Today's Date: 01/01/2018 OT Individual Time: 1000-1110 and 1357- 1450 OT Individual Time Calculation (min): 70 min and 53 min    Patient has met 8 of 8 long term goals due to improved activity tolerance, improved balance, ability to compensate for deficits and improved coordination.  Patient to discharge at overall Supervision level.  Patient's care partner is independent to provide the necessary physical and cognitive assistance at discharge.    Reasons goals not met: all goals met  Recommendation:  Patient will benefit from ongoing skilled OT services in home health setting to continue to advance functional skills in the area of BADL and iADL.  Equipment: 3 in 1 commode chair  Reasons for discharge: treatment goals met  Patient/family agrees with progress made and goals achieved: Yes   OT Intervention: Session 1: Pt supine in bed with no c/o pain this session and agreeable to OT intervention. His wife reports pt performing toileting needs and LB self care with overall supervision this morning prior to OT arrival. Caregiver verbalized feeling comfortable regarding the changing of hard collar pads. Pt demonstrated the ability to remove R UE sling with increased time. Pt washing UB and donning clean shirt with supervision and min verbal cues for proper technique. Caregiver demonstrated ability to don shoulder sling with increased time. OT discussed follow up and expectations for tomorrow. Caregiver asking several questions. Once all questions answered, pt returned to bed with call bell and all needed items within reach.   Session 2:Pt required encouragement for participation but standing from bed with close supervision. Pt ambulating 500'+ without use of AD at overall supervision and increased time. Pt taking seated rest break secondary to fatigue. Pt expressed no  concerns regarding discharge. Pt returning to room at end of session in same manner. Call bell and all needed items within reach upon exiting the room.     OT Discharge Precautions/Restrictions  Precautions Precautions: Fall;Cervical Type of Shoulder Precautions: in shoulder immobilizer for 6 weeks, pendulum ex to start after 6/17 Shoulder Interventions: Shoulder sling/immobilizer;At all times Required Braces or Orthoses: Cervical Brace;Sling Cervical Brace: Hard collar;At all times Restrictions Weight Bearing Restrictions: Yes RUE Weight Bearing: Non weight bearing Other Position/Activity Restrictions: R UE Pain Pain Assessment Pain Scale: 0-10 Pain Score: 4  Pain Type: Acute pain Pain Location: Back Pain Orientation: Left;Mid Pain Descriptors / Indicators: Aching Pain Onset: Gradual Patients Stated Pain Goal: 2 Pain Intervention(s): Repositioned Multiple Pain Sites: No ADL ADL ADL Comments: Please see functional navigator Vision Baseline Vision/History: No visual deficits Cognition Overall Cognitive Status: Within Functional Limits for tasks assessed Arousal/Alertness: Awake/alert Orientation Level: Oriented X4 Sensation Sensation Light Touch: Appears Intact Proprioception: Appears Intact Coordination Gross Motor Movements are Fluid and Coordinated: Yes Fine Motor Movements are Fluid and Coordinated: Yes Motor  Motor Motor: Other (comment) Motor - Discharge Observations: improving strength Mobility  Bed Mobility Bed Mobility: Sit to Supine Sit to Supine: Independent Transfers Sit to Stand: Supervision/Verbal cueing  Trunk/Postural Assessment  Cervical Assessment Cervical Assessment: Exceptions to WFL(hard collar) Thoracic Assessment Thoracic Assessment: Within Functional Limits Lumbar Assessment Lumbar Assessment: Exceptions to WFL(posterior pelvic tilt) Postural Control Postural Control: Deficits on evaluation Righting Reactions: delayed   Balance Balance Balance Assessed: Yes Berg Balance Test Sit to Stand: Able to stand  independently using hands Standing Unsupported: Able to stand safely 2 minutes Sitting with Back Unsupported but Feet Supported on Floor  or Stool: Able to sit safely and securely 2 minutes Stand to Sit: Uses backs of legs against chair to control descent Transfers: Able to transfer safely, minor use of hands Standing Unsupported with Eyes Closed: Able to stand 10 seconds with supervision Standing Ubsupported with Feet Together: Able to place feet together independently and stand 1 minute safely From Standing, Reach Forward with Outstretched Arm: Can reach forward >12 cm safely (5") From Standing Position, Pick up Object from Floor: Able to pick up shoe, needs supervision From Standing Position, Turn to Look Behind Over each Shoulder: Turn sideways only but maintains balance Turn 360 Degrees: Able to turn 360 degrees safely but slowly Standing Unsupported, Alternately Place Feet on Step/Stool: Able to complete >2 steps/needs minimal assist Standing Unsupported, One Foot in Front: Able to take small step independently and hold 30 seconds Standing on One Leg: Tries to lift leg/unable to hold 3 seconds but remains standing independently Total Score: 38 Extremity/Trunk Assessment RUE Assessment RUE Assessment: Not tested LUE Assessment LUE Assessment: Within Functional Limits   See Function Navigator for Current Functional Status.  Gypsy Decant 01/01/2018, 12:47 PM

## 2018-01-01 NOTE — Discharge Summary (Signed)
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  Therapies initiated revealing functional deficits in mobility and self-care tasks.  CIR was recommended for follow-up therapy.   Hospital  Course: [name] [name] was admitted to rehab 12/20/2017 for inpatient therapies to consist of PT and OT at least three hours five days a week. Past admission physiatrist, therapy team and rehab RN have worked together to provide customized collaborative inpatient rehab. He was maintained on Lovenox for DVT prophylaxis.  Lumbar films were ordered due to complaints of back pain with activity and showed evidence of old L2 fracture which did not corelate to his pain. Lidocaine patch, ice as well as lumbar corset was added with improvement in activity tolerance and pain. He is unable to tolerate any narcotics or increase in muscle relaxers due to side effects.  Tylenol was scheduled on qid basis with robaxin prn in addition to local measures and this has been effective in providing adequate pain management.  MiraLAX was increased to twice daily basis to help with constipation.  Blood pressures were monitored on twice daily basis and have been well controlled.  Acute blood loss anemia has been monitored and H&H is slowly improving.  He was encouraged to increase fluid intake to help with pre-renal azotemia.   Blood sugars were  monitored on ac/hs basis and metformin was resumed to help with better control.  SSI was used for tighter BS control. He is continent of bowel an bladder.  He has been seizure-free on Keppra. Pendulum exercises were initiated one week post op and he is tolerating this without too much discomfort.  Left shoulder incision is healing well without signs or symptoms of infection and sutures were removed on 6/25 prior to discharge. He has made good gains during his rehab stay and was at supervision level at discharge. He will continue to receive follow up HHPT and HHOT by [facility] after discharge.     Rehab course: During patient's stay in rehab weekly team conferences were held to monitor patient's progress, set goals and discuss barriers to discharge. At admission, patient required mod assist with mobility and basic self care tasks. Cognitive evaluation revealed MoCA score of 20/22 and testing revealed cognitive linguistic skills to be at baseline therefore no speech therapy needed during his stay. He  has had improvement in activity tolerance, balance, postural control as well as ability to compensate for deficits. He is able to complete ADL tasks with supervision. He requires supervision for transfers and to ambulate 200' without AD but with cues for safety. Family education was completed with wife who has been present for multiple therapy sessions.     Disposition:  Home  Diet: Diabetic diet.  Special Instructions: 1. No weight on left arm.  Limit range of motion to pendulum exercises. 2. Needs BMET/CBC rechecked in 7-10 days.   Discharge Instructions    Ambulatory referral to Physical Medicine Rehab   Complete by:  As directed    1-2 weeks transitional care appt     Allergies as of 01/02/2018      Reactions   Dilaudid [hydromorphone Hcl] Shortness Of Breath   Dilaudid [hydromorphone] Anaphylaxis, Shortness Of Breath   Hydrocodone Shortness Of Breath   Hydrocodone Anaphylaxis,  Shortness Of Breath   Morphine And Related    Oxycodone Shortness Of Breath   Oxycodone Anaphylaxis, Shortness Of Breath   Penicillins Anaphylaxis, Shortness Of Breath   PASSES OUT!! Has patient had a PCN reaction causing immediate rash, facial/tongue/throat swelling, SOB or lightheadedness with hypotension: Yes Has patient had a PCN reaction causing severe rash involving mucus membranes or skin necrosis: Unk Has patient had a PCN reaction that  required hospitalization: No, but was @ MD(s) office Has patient had a PCN reaction occurring within the last 10 years: No If all of the above answers are "NO", then may proceed with Cephalosporin use.   Tramadol Shortness Of Breath   Tramadol Anaphylaxis, Shortness Of Breath   Penicillins Other (See Comments)   Passes out Has patient had a PCN reaction causing immediate rash, facial/tongue/throat swelling, SOB or lightheadedness with hypotension: unknown Has patient had a PCN reaction causing severe rash involving mucus membranes or skin necrosis: unknown Has patient had a PCN reaction that required hospitalization no Has patient had a PCN reaction occurring within the last 10 years: no If all of the above answers are "NO", then may proceed with Cephalosporin use.      Medication List    STOP taking these medications   acetaminophen 500 MG tablet Commonly known as:  TYLENOL   aspirin EC 325 MG tablet   Biotin 40981 MCG Tabs   celecoxib 200 MG capsule Commonly known as:  CELEBREX   Copper Gluconate 2 MG Tabs   COPPER PO   EXCEDRIN EXTRA STRENGTH PO   ferrous sulfate 325 (65 FE) MG tablet   polyethylene glycol packet Commonly known as:  MIRALAX / GLYCOLAX   Potassium Citrate 15 MEQ (1620 MG) Tbcr   UROCIT-K 15 15 MEQ (1620 MG) Tbcr Generic drug:  Potassium Citrate     TAKE these medications   albuterol 1.25 MG/3ML nebulizer solution Commonly known as:  ACCUNEB Take 1 ampule by nebulization 3 (three) times daily as needed  for wheezing or shortness of breath.   calcium-vitamin D 500-200 MG-UNIT tablet Commonly known as:  OSCAL WITH D Take 1 tablet by mouth 3 (three) times daily.   COMBIVENT RESPIMAT 20-100 MCG/ACT Aers respimat Generic drug:  Ipratropium-Albuterol Inhale 1 puff into the lungs daily. What changed:  Another medication with the same name was removed. Continue taking this medication, and follow the directions you see here.   docusate sodium 100 MG capsule Commonly known as:  COLACE Take 1 capsule (100 mg total) by mouth 2 (two) times daily.   levETIRAcetam 500 MG tablet Commonly known as:  KEPPRA Take 1 tablet (500 mg total) by mouth 2 (two) times daily.   lidocaine 5 % Commonly known as:  LIDODERM Place 1 patch onto the skin daily. Remove & Discard patch within 12 hours or as directed by MD   losartan 50 MG tablet Commonly known as:  COZAAR Take 1 tablet (50 mg total) by mouth every Tuesday, Thursday, Saturday, and Sunday. In the morning.   metFORMIN 500 MG tablet Commonly known as:  GLUCOPHAGE Take 500 mg by mouth 2 (two) times daily with a meal.   methocarbamol 500 MG tablet Commonly known as:  ROBAXIN Take 1 tablet (500 mg total) by mouth every 6 (six) hours as needed for muscle spasms.   metoprolol succinate 25 MG 24 hr tablet Commonly known as:  TOPROL-XL Take 1 tablet (25 mg total) by mouth every Monday, Wednesday, and Friday. In the morning.   multivitamins with iron Tabs tablet Take 1 tablet by mouth daily.   pantoprazole 40 MG tablet Commonly known as:  PROTONIX Take 1 tablet (40 mg total) by mouth daily.   predniSONE 20 MG tablet Commonly known as:  DELTASONE Take 20 mg by mouth daily as needed (difficulty breathing).   zinc sulfate 220 (50 Zn) MG capsule Take 1 capsule (220 mg total) by mouth daily.  Follow-up Information    Ranelle Oyster, MD Follow up.   Specialty:  Physical Medicine and Rehabilitation Why:  Office will call you with follow up  appointment Contact information: [address] [place] [address] [phone]        Lisbeth Renshaw, MD. Call in 1 day(s).   Specialty:  Neurosurgery Why:  for follow up appointment Contact information: [address] [place] [address] [phone]        Tarry Kos, MD. Call in 1 day(s).   Specialty:  Orthopedic Surgery Why:  for post op appointment in next 1-2 weeks.  Contact information: [address] [place] [address] [phone]        Jackie Plum, MD Follow up on 01/09/2018.   Specialty:  Internal Medicine Why:  @ 9:00 am (hospital follow up appt) Contact information: [address] [place] [address] [phone]           Signed: Jacquelynn Cree 01/08/2018, 7:02 PM

## 2018-01-02 DIAGNOSIS — T1490XA Injury, unspecified, initial encounter: Secondary | ICD-10-CM | POA: Diagnosis not present

## 2018-01-02 LAB — GLUCOSE, CAPILLARY: GLUCOSE-CAPILLARY: 86 mg/dL (ref 70–99)

## 2018-01-02 NOTE — Progress Notes (Signed)
Social Work Discharge Note  The overall goal for the admission was met for:   Discharge location: Yes - home with wife able to provide assistance  Length of Stay: Yes - 13 days  Discharge activity level: Yes - supervision overall  Home/community participation: Yes  Services provided included: MD, RD, PT, OT, SLP, RN, TR, Pharmacy and [place]: [place] [employer]  Follow-up services arranged: Home Health: PT, OT via [place], DME: 3n1 commode and tub bench via [facility] and Patient/Family has no preference for HH/DME agencies  Comments (or additional information):  Patient/Family verbalized understanding of follow-up arrangements: Yes  Individual responsible for coordination of the follow-up plan: pt  Confirmed correct DME delivered: [name] [name] 01/02/2018    [name] [name]

## 2018-01-02 NOTE — Progress Notes (Signed)
Pt discharged to home with wife and family. Pt has all belongings at bedside. Pt's sutures removed from shoulder and pt given discharge instructions.

## 2018-01-02 NOTE — Progress Notes (Signed)
[facility]IT[facility]     PROGRESS NOTE    Subjective/Complaints: In excellent spirits. Wife at bedside  ROS: Patient denies fever, rash, sore throat, blurred vision, nausea, vomiting, diarrhea, cough, shortness of breath or chest pain, joint or back pain, headache, or mood change.    Objective:  No results found. No results for input(s): WBC, HGB, HCT, PLT in the last 72 hours. No results for input(s): NA, K, CL, GLUCOSE, BUN, CREATININE, CALCIUM in the last 72 hours.  Invalid input(s): CO CBG (last 3)  Recent Labs    01/01/18 1702 01/01/18 2131 01/02/18 0648  GLUCAP 104* 99 86    Wt Readings from Last 3 Encounters:  12/20/17 108.8 kg (239 lb 13.8 oz)  01/24/17 64.4 kg (142 lb)  01/19/17 64.7 kg (142 lb 9.6 oz)     Intake/Output Summary (Last 24 hours) at 01/02/2018 0850 Last data filed at 01/02/2018 0700 Gross per 24 hour  Intake 720 ml  Output -  Net 720 ml    Vital Signs: Blood pressure 128/76, pulse 82, temperature 98.8 F (37.1 C), temperature source Oral, resp. rate 18, height 5\' 7"  (1.702 m), weight 108.8 kg (239 lb 13.8 oz), SpO2 99 %. Physical Exam:  Constitutional: No distress . Vital signs reviewed. HEENT: EOMI, oral membranes moist Neck: supple Cardiovascular: RRR without murmur. No JVD    Respiratory: CTA Bilaterally without wheezes or rales. Normal effort    GI: BS +, non-tender, non-distended   Musc: RUE with ACE/dry. Left wrist remains with scabs, one 1cm area of hypergranulation -remains tender on left at 12th rib Neurological: He isalertand oriented to person, place, and time. Motor: left upper extremity: 4+/5 proximal to distal Right upper extremity: Shoulder abduction 1-2/5, elbow brace, and grip 4/5--pain limiting Left lower extremity: Hip flexion 4-/5, knee extension 4/5, ankle dorsiflexion 4+/5 Sensation intact to light touch--stable  alert and appropriate--at cognitive baseline Skin:  Abrasions/incisions  stable RUE/ left wrist above Psychiatric: He has a normal mood and affect. His behavior is normal.        Assessment/Plan: 1. 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 Physiatrist is providing close team supervision and 24 hour management of active medical problems listed below. Physiatrist and rehab team continue to assess barriers to discharge/monitor patient progress toward functional and medical goals.  Function:  Bathing Bathing position   Position: Sitting EOB  Bathing parts Body parts bathed by patient: Chest, Abdomen, Front perineal area, Right upper leg, Left upper leg, Buttocks, Right lower leg, Left lower leg, Left arm Body parts bathed by helper: Back  Bathing assist Assist Level: Supervision or verbal cues      Upper Body Dressing/Undressing Upper body dressing   What is the patient wearing?: Pull over shirt/dress     Pull over shirt/dress - Perfomed by patient: Thread/unthread right sleeve, Thread/unthread left sleeve, Put head through opening, Pull shirt over trunk Pull over shirt/dress - Perfomed by helper: Thread/unthread right sleeve, Put head through opening, Thread/unthread left sleeve        Upper body assist Assist Level: Supervision or verbal cues      Lower Body Dressing/Undressing Lower body dressing   What is the patient wearing?: Pants, Non-skid slipper socks, Shoes, Ted Hose(per staff report)     Pants- Performed by patient: Thread/unthread right pants leg, Thread/unthread left pants leg, Pull pants up/down Pants- Performed by helper: Thread/unthread right pants  leg, Thread/unthread left pants leg Non-skid slipper socks- Performed by patient: Don/doff right sock, Don/doff left sock Non-skid slipper socks- Performed by helper: Don/doff right sock, Don/doff left sock     Shoes - Performed by patient: Don/doff right shoe,  Don/doff left shoe         TED Hose - Performed by helper: Don/doff right TED hose, Don/doff left TED hose  Lower body assist Assist for lower body dressing: Set up, Supervision or verbal cues Assistive Device Comment: Mudloggerreacher     Toileting Toileting Toileting activity did not occur: No continent bowel/bladder event Toileting steps completed by patient: Adjust clothing prior to toileting, Performs perineal hygiene, Adjust clothing after toileting Toileting steps completed by helper: Adjust clothing prior to toileting, Performs perineal hygiene, Adjust clothing after toileting Toileting Assistive Devices: Grab bar or rail  Toileting assist Assist level: Supervision or verbal cues   Transfers Chair/bed transfer   Chair/bed transfer method: Stand pivot Chair/bed transfer assist level: Supervision or verbal cues Chair/bed transfer assistive device: Armrests, Bedrails     Locomotion Ambulation     Max distance: 500' Assist level: Supervision or verbal cues   Wheelchair   Type: Manual Max wheelchair distance: >300' Assist Level: Supervision or verbal cues  Cognition Comprehension Comprehension assist level: Follows complex conversation/direction with no assist  Expression Expression assist level: Expresses complex ideas: With no assist  Social Interaction Social Interaction assist level: Interacts appropriately with others - No medications needed.  Problem Solving Problem solving assist level: Solves complex problems: Recognizes & self-corrects  Memory Memory assist level: Complete Independence: No helper   Medical Problem List and Plan: 1.  Weakness, limited endurance, limitation in self-care secondary to polytrauma with TBI.  -dc home today. HH follow up  -Patient to see Rehab MD/provider in the office for transitional care encounter in 1-2 weeks.  2.  DVT Prophylaxis/Anticoagulation: Pharmaceutical: Lovenox 3. Pain Management: prn tylenol,  -prn robaxin  -corset to support  low back when up--helping somewhat     -lidoderm patch, ice   -cannot tolerate narcotics 4. Mood: LCSW to follow for evaluation and support.  5. Neuropsych: This patient is capable of making decisions on his own behalf. 6. Skin/Wound Care: silver nitrate to left wrist wound  -remove sutures right shoulder 7. Fluids/Electrolytes/Nutrition: encouraging PO  -protein supp 8.  Seizure prophylaxis: On keppra 9. HTN: Monitor BP bid--continue Cozaar and metoprolol.  Vitals:   01/01/18 1958 01/02/18 0405  BP: 119/71 128/76  Pulse: 79 82  Resp: 16 18  Temp: 98.1 F (36.7 C) 98.8 F (37.1 C)  SpO2: 100% 99%  Blood pressure well controlled 01/02/2018 10. Constipation: Increased miralax to bid- needs bm 11. COPD: Resumed Combivent bid. Encourage IS while awake. 12. ABLA:  hgb 8.3 6/13. No signs of blood loss  -recheck 8.9 6/20 13. Right proximal humerus fracture s/p ORIF 6/10: NWB RUE  -Tolerating pendulum exercises so far  -ortho follow up 14. OSA?: Untreated. Encouraged patient to follow up with MD for testing after discharge.  15. T2DM: Monitor BS ac/hs. Resumed metformin.   SSI for elevated BS/thigter control to help promote wound healing.    CBG (last 3)  Recent Labs    01/01/18 1702 01/01/18 2131 01/02/18 0648  GLUCAP 104* 99 86  Controlled 6/25     LOS (Days) 13 A FACE TO FACE EVALUATION WAS PERFORMED  Ranelle OysterZachary T Swartz, MD 01/02/2018 8:50 AM

## 2018-01-03 DIAGNOSIS — S42291D Other displaced fracture of upper end of right humerus, subsequent encounter for fracture with routine healing: Secondary | ICD-10-CM | POA: Diagnosis not present

## 2018-01-03 DIAGNOSIS — S065X0D Traumatic subdural hemorrhage without loss of consciousness, subsequent encounter: Secondary | ICD-10-CM | POA: Diagnosis not present

## 2018-01-03 DIAGNOSIS — S066X0D Traumatic subarachnoid hemorrhage without loss of consciousness, subsequent encounter: Secondary | ICD-10-CM | POA: Diagnosis not present

## 2018-01-03 DIAGNOSIS — I10 Essential (primary) hypertension: Secondary | ICD-10-CM | POA: Diagnosis not present

## 2018-01-03 DIAGNOSIS — S12190D Other displaced fracture of second cervical vertebra, subsequent encounter for fracture with routine healing: Secondary | ICD-10-CM | POA: Diagnosis not present

## 2018-01-03 DIAGNOSIS — S12290D Other displaced fracture of third cervical vertebra, subsequent encounter for fracture with routine healing: Secondary | ICD-10-CM | POA: Diagnosis not present

## 2018-01-03 DIAGNOSIS — D62 Acute posthemorrhagic anemia: Secondary | ICD-10-CM | POA: Diagnosis not present

## 2018-01-03 DIAGNOSIS — S12390D Other displaced fracture of fourth cervical vertebra, subsequent encounter for fracture with routine healing: Secondary | ICD-10-CM | POA: Diagnosis not present

## 2018-01-03 DIAGNOSIS — J449 Chronic obstructive pulmonary disease, unspecified: Secondary | ICD-10-CM | POA: Diagnosis not present

## 2018-01-04 ENCOUNTER — Telehealth: Payer: Self-pay | Admitting: Registered Nurse

## 2018-01-04 NOTE — Telephone Encounter (Signed)
Transitional Care call Transition Care Call Questions answered by wife [name]  Patient name: [name] [name] AgentWilliam Halseth DOB: 01/07/2016 1. Are you/is patient experiencing any problems since coming home? No a. Are there any questions regarding any aspect of care? No 2. Are there any questions regarding medications administration/dosing? No a. Are meds being taken as prescribed? Yes b. "Patient should review meds with caller to confirm" Medication List Reviewed 3. Have there been any falls? No 4. Has Home Health been to the house and/or have they contacted you? Yes, [facility] a. If not, have you tried to contact them? NA b. Can we help you contact them? NA 5. Are bowels and bladder emptying properly? Yes a. Are there any unexpected incontinence issues? No b. If applicable, is patient following bowel/bladder programs? NA 6. Any fevers, problems with breathing, unexpected pain? No 7. Are there any skin problems or new areas of breakdown? No 8. Has the patient/family member arranged specialty MD follow up (ie cardiology/neurology/renal/surgical/etc.)?  [name] states she is in the process of scheduling follow up appointments.  a. Can we help arrange? No 9. Does the patient need any other services or support that we can help arrange? No 10. Are caregivers following through as expected in assisting the patient? Yes 11. Has the patient quit smoking, drinking alcohol, or using drugs as recommended? [name] states [name] doesn't smoke, drinl alcohol or use illicit drugs.  12.   Appointment date/time 01/16/2018, arrival time 11:00 for 11:20 appointment with Jacalyn LefevreEunice Dominick Zertuche ANP-C.  At [address]

## 2018-01-05 ENCOUNTER — Telehealth: Payer: Self-pay

## 2018-01-05 NOTE — Telephone Encounter (Signed)
Yehuda MaoJeanine, PT/[facility] called requesting verbal orders for 1wk1, 2wk2. Left verbal orders on voicemail

## 2018-01-08 DIAGNOSIS — N19 Unspecified kidney failure: Secondary | ICD-10-CM

## 2018-01-08 DIAGNOSIS — S066X0D Traumatic subarachnoid hemorrhage without loss of consciousness, subsequent encounter: Secondary | ICD-10-CM | POA: Diagnosis not present

## 2018-01-08 DIAGNOSIS — S12390D Other displaced fracture of fourth cervical vertebra, subsequent encounter for fracture with routine healing: Secondary | ICD-10-CM | POA: Diagnosis not present

## 2018-01-08 DIAGNOSIS — S065X0D Traumatic subdural hemorrhage without loss of consciousness, subsequent encounter: Secondary | ICD-10-CM | POA: Diagnosis not present

## 2018-01-08 DIAGNOSIS — D62 Acute posthemorrhagic anemia: Secondary | ICD-10-CM | POA: Diagnosis not present

## 2018-01-08 DIAGNOSIS — S12290D Other displaced fracture of third cervical vertebra, subsequent encounter for fracture with routine healing: Secondary | ICD-10-CM | POA: Diagnosis not present

## 2018-01-08 DIAGNOSIS — J449 Chronic obstructive pulmonary disease, unspecified: Secondary | ICD-10-CM | POA: Diagnosis not present

## 2018-01-08 DIAGNOSIS — I10 Essential (primary) hypertension: Secondary | ICD-10-CM | POA: Diagnosis not present

## 2018-01-08 DIAGNOSIS — S42291D Other displaced fracture of upper end of right humerus, subsequent encounter for fracture with routine healing: Secondary | ICD-10-CM | POA: Diagnosis not present

## 2018-01-08 DIAGNOSIS — R7989 Other specified abnormal findings of blood chemistry: Secondary | ICD-10-CM

## 2018-01-08 DIAGNOSIS — S12190D Other displaced fracture of second cervical vertebra, subsequent encounter for fracture with routine healing: Secondary | ICD-10-CM | POA: Diagnosis not present

## 2018-01-10 ENCOUNTER — Telehealth: Payer: Self-pay | Admitting: *Deleted

## 2018-01-10 DIAGNOSIS — S066X0D Traumatic subarachnoid hemorrhage without loss of consciousness, subsequent encounter: Secondary | ICD-10-CM | POA: Diagnosis not present

## 2018-01-10 DIAGNOSIS — J449 Chronic obstructive pulmonary disease, unspecified: Secondary | ICD-10-CM | POA: Diagnosis not present

## 2018-01-10 DIAGNOSIS — I10 Essential (primary) hypertension: Secondary | ICD-10-CM | POA: Diagnosis not present

## 2018-01-10 DIAGNOSIS — S065X0D Traumatic subdural hemorrhage without loss of consciousness, subsequent encounter: Secondary | ICD-10-CM | POA: Diagnosis not present

## 2018-01-10 DIAGNOSIS — S12190D Other displaced fracture of second cervical vertebra, subsequent encounter for fracture with routine healing: Secondary | ICD-10-CM | POA: Diagnosis not present

## 2018-01-10 DIAGNOSIS — S12390D Other displaced fracture of fourth cervical vertebra, subsequent encounter for fracture with routine healing: Secondary | ICD-10-CM | POA: Diagnosis not present

## 2018-01-10 DIAGNOSIS — D62 Acute posthemorrhagic anemia: Secondary | ICD-10-CM | POA: Diagnosis not present

## 2018-01-10 DIAGNOSIS — S42291D Other displaced fracture of upper end of right humerus, subsequent encounter for fracture with routine healing: Secondary | ICD-10-CM | POA: Diagnosis not present

## 2018-01-10 DIAGNOSIS — S12290D Other displaced fracture of third cervical vertebra, subsequent encounter for fracture with routine healing: Secondary | ICD-10-CM | POA: Diagnosis not present

## 2018-01-10 NOTE — Telephone Encounter (Signed)
Beaulah CorinSusan Davis, OT, [facility]  [facility] a message [facility]. Chart reviewed. Verbal orders given per office protocol

## 2018-01-15 ENCOUNTER — Other Ambulatory Visit: Payer: Self-pay | Admitting: Physician Assistant

## 2018-01-15 DIAGNOSIS — S129XXA Fracture of neck, unspecified, initial encounter: Secondary | ICD-10-CM | POA: Diagnosis not present

## 2018-01-15 DIAGNOSIS — E119 Type 2 diabetes mellitus without complications: Secondary | ICD-10-CM | POA: Diagnosis not present

## 2018-01-15 DIAGNOSIS — Z7984 Long term (current) use of oral hypoglycemic drugs: Secondary | ICD-10-CM | POA: Diagnosis not present

## 2018-01-15 DIAGNOSIS — S066X0D Traumatic subarachnoid hemorrhage without loss of consciousness, subsequent encounter: Secondary | ICD-10-CM | POA: Diagnosis not present

## 2018-01-15 DIAGNOSIS — Z823 Family history of stroke: Secondary | ICD-10-CM | POA: Diagnosis not present

## 2018-01-15 DIAGNOSIS — S065X0D Traumatic subdural hemorrhage without loss of consciousness, subsequent encounter: Secondary | ICD-10-CM | POA: Diagnosis not present

## 2018-01-15 DIAGNOSIS — J449 Chronic obstructive pulmonary disease, unspecified: Secondary | ICD-10-CM | POA: Diagnosis not present

## 2018-01-15 DIAGNOSIS — Z87891 Personal history of nicotine dependence: Secondary | ICD-10-CM | POA: Diagnosis not present

## 2018-01-15 DIAGNOSIS — S12290D Other displaced fracture of third cervical vertebra, subsequent encounter for fracture with routine healing: Secondary | ICD-10-CM | POA: Diagnosis not present

## 2018-01-15 DIAGNOSIS — G8929 Other chronic pain: Secondary | ICD-10-CM | POA: Diagnosis not present

## 2018-01-15 DIAGNOSIS — R000 Tachycardia, unspecified: Secondary | ICD-10-CM | POA: Diagnosis not present

## 2018-01-15 DIAGNOSIS — N200 Calculus of kidney: Secondary | ICD-10-CM | POA: Diagnosis not present

## 2018-01-15 DIAGNOSIS — S12390D Other displaced fracture of fourth cervical vertebra, subsequent encounter for fracture with routine healing: Secondary | ICD-10-CM | POA: Diagnosis not present

## 2018-01-15 DIAGNOSIS — S42291D Other displaced fracture of upper end of right humerus, subsequent encounter for fracture with routine healing: Secondary | ICD-10-CM | POA: Diagnosis not present

## 2018-01-15 DIAGNOSIS — D62 Acute posthemorrhagic anemia: Secondary | ICD-10-CM | POA: Diagnosis not present

## 2018-01-15 DIAGNOSIS — I10 Essential (primary) hypertension: Secondary | ICD-10-CM | POA: Diagnosis not present

## 2018-01-15 DIAGNOSIS — Z809 Family history of malignant neoplasm, unspecified: Secondary | ICD-10-CM | POA: Diagnosis not present

## 2018-01-15 DIAGNOSIS — S12190D Other displaced fracture of second cervical vertebra, subsequent encounter for fracture with routine healing: Secondary | ICD-10-CM | POA: Diagnosis not present

## 2018-01-16 ENCOUNTER — Telehealth: Payer: Self-pay

## 2018-01-16 ENCOUNTER — Encounter: Payer: Medicare HMO | Attending: Registered Nurse | Admitting: Registered Nurse

## 2018-01-16 ENCOUNTER — Other Ambulatory Visit: Payer: Self-pay

## 2018-01-16 ENCOUNTER — Encounter: Payer: Self-pay | Admitting: Registered Nurse

## 2018-01-16 VITALS — BP 128/81 | HR 84 | Wt 131.0 lb

## 2018-01-16 DIAGNOSIS — J449 Chronic obstructive pulmonary disease, unspecified: Secondary | ICD-10-CM | POA: Diagnosis not present

## 2018-01-16 DIAGNOSIS — Z87442 Personal history of urinary calculi: Secondary | ICD-10-CM | POA: Diagnosis not present

## 2018-01-16 DIAGNOSIS — X58XXXD Exposure to other specified factors, subsequent encounter: Secondary | ICD-10-CM | POA: Diagnosis not present

## 2018-01-16 DIAGNOSIS — S069X9A Unspecified intracranial injury with loss of consciousness of unspecified duration, initial encounter: Secondary | ICD-10-CM | POA: Diagnosis not present

## 2018-01-16 DIAGNOSIS — S12301D Unspecified nondisplaced fracture of fourth cervical vertebra, subsequent encounter for fracture with routine healing: Secondary | ICD-10-CM | POA: Insufficient documentation

## 2018-01-16 DIAGNOSIS — G4730 Sleep apnea, unspecified: Secondary | ICD-10-CM | POA: Insufficient documentation

## 2018-01-16 DIAGNOSIS — S12201D Unspecified nondisplaced fracture of third cervical vertebra, subsequent encounter for fracture with routine healing: Secondary | ICD-10-CM | POA: Diagnosis not present

## 2018-01-16 DIAGNOSIS — M545 Low back pain, unspecified: Secondary | ICD-10-CM

## 2018-01-16 DIAGNOSIS — E1122 Type 2 diabetes mellitus with diabetic chronic kidney disease: Secondary | ICD-10-CM | POA: Diagnosis not present

## 2018-01-16 DIAGNOSIS — S12101D Unspecified nondisplaced fracture of second cervical vertebra, subsequent encounter for fracture with routine healing: Secondary | ICD-10-CM | POA: Diagnosis not present

## 2018-01-16 DIAGNOSIS — S12201A Unspecified nondisplaced fracture of third cervical vertebra, initial encounter for closed fracture: Secondary | ICD-10-CM | POA: Diagnosis not present

## 2018-01-16 DIAGNOSIS — I10 Essential (primary) hypertension: Secondary | ICD-10-CM | POA: Diagnosis not present

## 2018-01-16 DIAGNOSIS — S42301D Unspecified fracture of shaft of humerus, right arm, subsequent encounter for fracture with routine healing: Secondary | ICD-10-CM | POA: Insufficient documentation

## 2018-01-16 DIAGNOSIS — S12101A Unspecified nondisplaced fracture of second cervical vertebra, initial encounter for closed fracture: Secondary | ICD-10-CM

## 2018-01-16 DIAGNOSIS — E119 Type 2 diabetes mellitus without complications: Secondary | ICD-10-CM

## 2018-01-16 DIAGNOSIS — T1490XA Injury, unspecified, initial encounter: Secondary | ICD-10-CM | POA: Diagnosis not present

## 2018-01-16 DIAGNOSIS — S12301A Unspecified nondisplaced fracture of fourth cervical vertebra, initial encounter for closed fracture: Secondary | ICD-10-CM | POA: Diagnosis not present

## 2018-01-16 DIAGNOSIS — S069X0D Unspecified intracranial injury without loss of consciousness, subsequent encounter: Secondary | ICD-10-CM | POA: Diagnosis not present

## 2018-01-16 DIAGNOSIS — I609 Nontraumatic subarachnoid hemorrhage, unspecified: Secondary | ICD-10-CM | POA: Diagnosis not present

## 2018-01-16 DIAGNOSIS — S42201A Unspecified fracture of upper end of right humerus, initial encounter for closed fracture: Secondary | ICD-10-CM | POA: Diagnosis not present

## 2018-01-16 DIAGNOSIS — I129 Hypertensive chronic kidney disease with stage 1 through stage 4 chronic kidney disease, or unspecified chronic kidney disease: Secondary | ICD-10-CM | POA: Insufficient documentation

## 2018-01-16 DIAGNOSIS — N189 Chronic kidney disease, unspecified: Secondary | ICD-10-CM | POA: Diagnosis not present

## 2018-01-16 DIAGNOSIS — Z8619 Personal history of other infectious and parasitic diseases: Secondary | ICD-10-CM | POA: Diagnosis not present

## 2018-01-16 MED ORDER — LIDOCAINE 5 % EX PTCH: 1.0000 | MEDICATED_PATCH | CUTANEOUS | 1 refills | Status: AC
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
## 2018-01-16 NOTE — Telephone Encounter (Signed)
Lidocaine patches approved through 07/10/18 pharmacy and patient notified.

## 2018-01-17 ENCOUNTER — Ambulatory Visit (INDEPENDENT_AMBULATORY_CARE_PROVIDER_SITE_OTHER): Payer: Medicare HMO

## 2018-01-17 ENCOUNTER — Ambulatory Visit (INDEPENDENT_AMBULATORY_CARE_PROVIDER_SITE_OTHER): Payer: Medicare HMO | Admitting: Orthopaedic Surgery

## 2018-01-17 DIAGNOSIS — M25511 Pain in right shoulder: Secondary | ICD-10-CM | POA: Diagnosis not present

## 2018-01-17 DIAGNOSIS — S42201D Unspecified fracture of upper end of right humerus, subsequent encounter for fracture with routine healing: Secondary | ICD-10-CM
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
## 2018-01-18 DIAGNOSIS — J449 Chronic obstructive pulmonary disease, unspecified: Secondary | ICD-10-CM | POA: Diagnosis not present

## 2018-01-18 DIAGNOSIS — S066X0D Traumatic subarachnoid hemorrhage without loss of consciousness, subsequent encounter: Secondary | ICD-10-CM | POA: Diagnosis not present

## 2018-01-18 DIAGNOSIS — S12390D Other displaced fracture of fourth cervical vertebra, subsequent encounter for fracture with routine healing: Secondary | ICD-10-CM | POA: Diagnosis not present

## 2018-01-18 DIAGNOSIS — D62 Acute posthemorrhagic anemia: Secondary | ICD-10-CM | POA: Diagnosis not present

## 2018-01-18 DIAGNOSIS — S42291D Other displaced fracture of upper end of right humerus, subsequent encounter for fracture with routine healing: Secondary | ICD-10-CM | POA: Diagnosis not present

## 2018-01-18 DIAGNOSIS — S12290D Other displaced fracture of third cervical vertebra, subsequent encounter for fracture with routine healing: Secondary | ICD-10-CM | POA: Diagnosis not present

## 2018-01-18 DIAGNOSIS — I10 Essential (primary) hypertension: Secondary | ICD-10-CM | POA: Diagnosis not present

## 2018-01-18 DIAGNOSIS — S12190D Other displaced fracture of second cervical vertebra, subsequent encounter for fracture with routine healing: Secondary | ICD-10-CM | POA: Diagnosis not present

## 2018-01-18 DIAGNOSIS — S065X0D Traumatic subdural hemorrhage without loss of consciousness, subsequent encounter: Secondary | ICD-10-CM | POA: Diagnosis not present

## 2018-01-22 DIAGNOSIS — S12190D Other displaced fracture of second cervical vertebra, subsequent encounter for fracture with routine healing: Secondary | ICD-10-CM | POA: Diagnosis not present

## 2018-01-22 DIAGNOSIS — J449 Chronic obstructive pulmonary disease, unspecified: Secondary | ICD-10-CM | POA: Diagnosis not present

## 2018-01-22 DIAGNOSIS — I10 Essential (primary) hypertension: Secondary | ICD-10-CM | POA: Diagnosis not present

## 2018-01-22 DIAGNOSIS — S066X0D Traumatic subarachnoid hemorrhage without loss of consciousness, subsequent encounter: Secondary | ICD-10-CM | POA: Diagnosis not present

## 2018-01-22 DIAGNOSIS — S12390D Other displaced fracture of fourth cervical vertebra, subsequent encounter for fracture with routine healing: Secondary | ICD-10-CM | POA: Diagnosis not present

## 2018-01-22 DIAGNOSIS — D62 Acute posthemorrhagic anemia: Secondary | ICD-10-CM | POA: Diagnosis not present

## 2018-01-22 DIAGNOSIS — S42291D Other displaced fracture of upper end of right humerus, subsequent encounter for fracture with routine healing: Secondary | ICD-10-CM | POA: Diagnosis not present

## 2018-01-22 DIAGNOSIS — Z7984 Long term (current) use of oral hypoglycemic drugs: Secondary | ICD-10-CM | POA: Diagnosis not present

## 2018-01-22 DIAGNOSIS — S12290D Other displaced fracture of third cervical vertebra, subsequent encounter for fracture with routine healing: Secondary | ICD-10-CM | POA: Diagnosis not present

## 2018-01-22 DIAGNOSIS — Z86718 Personal history of other venous thrombosis and embolism: Secondary | ICD-10-CM

## 2018-01-22 DIAGNOSIS — E119 Type 2 diabetes mellitus without complications: Secondary | ICD-10-CM | POA: Diagnosis not present

## 2018-01-22 DIAGNOSIS — S065X0D Traumatic subdural hemorrhage without loss of consciousness, subsequent encounter: Secondary | ICD-10-CM | POA: Diagnosis not present

## 2018-01-22 DIAGNOSIS — Z96651 Presence of right artificial knee joint: Secondary | ICD-10-CM

## 2018-01-25 DIAGNOSIS — S12290D Other displaced fracture of third cervical vertebra, subsequent encounter for fracture with routine healing: Secondary | ICD-10-CM | POA: Diagnosis not present

## 2018-01-25 DIAGNOSIS — D62 Acute posthemorrhagic anemia: Secondary | ICD-10-CM | POA: Diagnosis not present

## 2018-01-25 DIAGNOSIS — S066X0D Traumatic subarachnoid hemorrhage without loss of consciousness, subsequent encounter: Secondary | ICD-10-CM | POA: Diagnosis not present

## 2018-01-25 DIAGNOSIS — I10 Essential (primary) hypertension: Secondary | ICD-10-CM | POA: Diagnosis not present

## 2018-01-25 DIAGNOSIS — S12390D Other displaced fracture of fourth cervical vertebra, subsequent encounter for fracture with routine healing: Secondary | ICD-10-CM | POA: Diagnosis not present

## 2018-01-25 DIAGNOSIS — S42291D Other displaced fracture of upper end of right humerus, subsequent encounter for fracture with routine healing: Secondary | ICD-10-CM | POA: Diagnosis not present

## 2018-01-25 DIAGNOSIS — J449 Chronic obstructive pulmonary disease, unspecified: Secondary | ICD-10-CM | POA: Diagnosis not present

## 2018-01-25 DIAGNOSIS — S12190D Other displaced fracture of second cervical vertebra, subsequent encounter for fracture with routine healing: Secondary | ICD-10-CM | POA: Diagnosis not present

## 2018-01-25 DIAGNOSIS — S065X0D Traumatic subdural hemorrhage without loss of consciousness, subsequent encounter: Secondary | ICD-10-CM | POA: Diagnosis not present

## 2018-02-02 DIAGNOSIS — S12190D Other displaced fracture of second cervical vertebra, subsequent encounter for fracture with routine healing: Secondary | ICD-10-CM | POA: Diagnosis not present

## 2018-02-02 DIAGNOSIS — J449 Chronic obstructive pulmonary disease, unspecified: Secondary | ICD-10-CM | POA: Diagnosis not present

## 2018-02-02 DIAGNOSIS — I10 Essential (primary) hypertension: Secondary | ICD-10-CM | POA: Diagnosis not present

## 2018-02-02 DIAGNOSIS — S065X0D Traumatic subdural hemorrhage without loss of consciousness, subsequent encounter: Secondary | ICD-10-CM | POA: Diagnosis not present

## 2018-02-02 DIAGNOSIS — S42291D Other displaced fracture of upper end of right humerus, subsequent encounter for fracture with routine healing: Secondary | ICD-10-CM | POA: Diagnosis not present

## 2018-02-02 DIAGNOSIS — S12390D Other displaced fracture of fourth cervical vertebra, subsequent encounter for fracture with routine healing: Secondary | ICD-10-CM | POA: Diagnosis not present

## 2018-02-02 DIAGNOSIS — S12290D Other displaced fracture of third cervical vertebra, subsequent encounter for fracture with routine healing: Secondary | ICD-10-CM | POA: Diagnosis not present

## 2018-02-02 DIAGNOSIS — S066X0D Traumatic subarachnoid hemorrhage without loss of consciousness, subsequent encounter: Secondary | ICD-10-CM | POA: Diagnosis not present

## 2018-02-02 DIAGNOSIS — D62 Acute posthemorrhagic anemia: Secondary | ICD-10-CM | POA: Diagnosis not present

## 2018-02-05 ENCOUNTER — Ambulatory Visit
Admission: RE | Admit: 2018-02-05 | Discharge: 2018-02-05 | Disposition: A | Discharge status: Home / Self Care | Payer: Medicare HMO | Source: Ambulatory Visit | Attending: Physician Assistant | Admitting: Physician Assistant

## 2018-02-05 DIAGNOSIS — S066X0D Traumatic subarachnoid hemorrhage without loss of consciousness, subsequent encounter: Secondary | ICD-10-CM

## 2018-02-05 DIAGNOSIS — S06360A Traumatic hemorrhage of cerebrum, unspecified, without loss of consciousness, initial encounter: Secondary | ICD-10-CM | POA: Diagnosis not present

## 2018-02-08 ENCOUNTER — Telehealth: Payer: Self-pay | Admitting: *Deleted

## 2018-02-08 NOTE — Telephone Encounter (Signed)
Beaulah CorinSusan Davis, OT, [facility] left [facility] message asking for verbal [facility]4 to address ROM and ADL's.  Medical record reviewed. Verbal orders given per office protocol

## 2018-02-09 DIAGNOSIS — D62 Acute posthemorrhagic anemia: Secondary | ICD-10-CM | POA: Diagnosis not present

## 2018-02-09 DIAGNOSIS — I10 Essential (primary) hypertension: Secondary | ICD-10-CM | POA: Diagnosis not present

## 2018-02-09 DIAGNOSIS — S12190D Other displaced fracture of second cervical vertebra, subsequent encounter for fracture with routine healing: Secondary | ICD-10-CM | POA: Diagnosis not present

## 2018-02-09 DIAGNOSIS — S12390D Other displaced fracture of fourth cervical vertebra, subsequent encounter for fracture with routine healing: Secondary | ICD-10-CM | POA: Diagnosis not present

## 2018-02-09 DIAGNOSIS — S42291D Other displaced fracture of upper end of right humerus, subsequent encounter for fracture with routine healing: Secondary | ICD-10-CM | POA: Diagnosis not present

## 2018-02-09 DIAGNOSIS — J449 Chronic obstructive pulmonary disease, unspecified: Secondary | ICD-10-CM | POA: Diagnosis not present

## 2018-02-09 DIAGNOSIS — S065X0D Traumatic subdural hemorrhage without loss of consciousness, subsequent encounter: Secondary | ICD-10-CM | POA: Diagnosis not present

## 2018-02-09 DIAGNOSIS — S12290D Other displaced fracture of third cervical vertebra, subsequent encounter for fracture with routine healing: Secondary | ICD-10-CM | POA: Diagnosis not present

## 2018-02-09 DIAGNOSIS — S066X0D Traumatic subarachnoid hemorrhage without loss of consciousness, subsequent encounter: Secondary | ICD-10-CM | POA: Diagnosis not present

## 2018-02-12 DIAGNOSIS — S066X0D Traumatic subarachnoid hemorrhage without loss of consciousness, subsequent encounter: Secondary | ICD-10-CM | POA: Diagnosis not present

## 2018-02-12 DIAGNOSIS — S129XXA Fracture of neck, unspecified, initial encounter: Secondary | ICD-10-CM | POA: Diagnosis not present

## 2018-02-13 ENCOUNTER — Encounter: Payer: Medicare HMO | Attending: Physical Medicine & Rehabilitation | Admitting: Physical Medicine & Rehabilitation

## 2018-02-13 ENCOUNTER — Encounter: Payer: Self-pay | Admitting: Physical Medicine & Rehabilitation

## 2018-02-13 VITALS — BP 138/84 | HR 75 | Ht 67.0 in | Wt 134.0 lb

## 2018-02-13 DIAGNOSIS — S069X0D Unspecified intracranial injury without loss of consciousness, subsequent encounter: Secondary | ICD-10-CM | POA: Insufficient documentation

## 2018-02-13 DIAGNOSIS — G4730 Sleep apnea, unspecified: Secondary | ICD-10-CM | POA: Insufficient documentation

## 2018-02-13 DIAGNOSIS — S12201D Unspecified nondisplaced fracture of third cervical vertebra, subsequent encounter for fracture with routine healing: Secondary | ICD-10-CM | POA: Insufficient documentation

## 2018-02-13 DIAGNOSIS — T1490XA Injury, unspecified, initial encounter: Secondary | ICD-10-CM

## 2018-02-13 DIAGNOSIS — S12301D Unspecified nondisplaced fracture of fourth cervical vertebra, subsequent encounter for fracture with routine healing: Secondary | ICD-10-CM | POA: Insufficient documentation

## 2018-02-13 DIAGNOSIS — M7501 Adhesive capsulitis of right shoulder: Secondary | ICD-10-CM | POA: Diagnosis not present

## 2018-02-13 DIAGNOSIS — N189 Chronic kidney disease, unspecified: Secondary | ICD-10-CM | POA: Diagnosis not present

## 2018-02-13 DIAGNOSIS — M545 Low back pain: Secondary | ICD-10-CM | POA: Insufficient documentation

## 2018-02-13 DIAGNOSIS — S42301D Unspecified fracture of shaft of humerus, right arm, subsequent encounter for fracture with routine healing: Secondary | ICD-10-CM | POA: Diagnosis not present

## 2018-02-13 DIAGNOSIS — J449 Chronic obstructive pulmonary disease, unspecified: Secondary | ICD-10-CM | POA: Insufficient documentation

## 2018-02-13 DIAGNOSIS — E1122 Type 2 diabetes mellitus with diabetic chronic kidney disease: Secondary | ICD-10-CM | POA: Diagnosis not present

## 2018-02-13 DIAGNOSIS — I129 Hypertensive chronic kidney disease with stage 1 through stage 4 chronic kidney disease, or unspecified chronic kidney disease: Secondary | ICD-10-CM | POA: Diagnosis not present

## 2018-02-13 DIAGNOSIS — Z87442 Personal history of urinary calculi: Secondary | ICD-10-CM | POA: Diagnosis not present

## 2018-02-13 DIAGNOSIS — S12101D Unspecified nondisplaced fracture of second cervical vertebra, subsequent encounter for fracture with routine healing: Secondary | ICD-10-CM | POA: Insufficient documentation

## 2018-02-13 DIAGNOSIS — Z8619 Personal history of other infectious and parasitic diseases: Secondary | ICD-10-CM | POA: Diagnosis not present
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
## 2018-02-13 NOTE — Patient Instructions (Signed)
PLEASE FEEL FREE TO CALL OUR OFFICE WITH ANY PROBLEMS OR QUESTIONS ([phone])      

## 2018-02-16 DIAGNOSIS — E559 Vitamin D deficiency, unspecified: Secondary | ICD-10-CM | POA: Diagnosis not present

## 2018-02-16 DIAGNOSIS — M25512 Pain in left shoulder: Secondary | ICD-10-CM | POA: Diagnosis not present

## 2018-02-16 DIAGNOSIS — E118 Type 2 diabetes mellitus with unspecified complications: Secondary | ICD-10-CM | POA: Diagnosis not present

## 2018-02-16 DIAGNOSIS — E7800 Pure hypercholesterolemia, unspecified: Secondary | ICD-10-CM | POA: Diagnosis not present

## 2018-02-16 DIAGNOSIS — R0602 Shortness of breath: Secondary | ICD-10-CM | POA: Diagnosis not present

## 2018-02-16 DIAGNOSIS — M542 Cervicalgia: Secondary | ICD-10-CM | POA: Diagnosis not present

## 2018-02-16 DIAGNOSIS — I10 Essential (primary) hypertension: Secondary | ICD-10-CM | POA: Diagnosis not present

## 2018-02-20 ENCOUNTER — Ambulatory Visit (INDEPENDENT_AMBULATORY_CARE_PROVIDER_SITE_OTHER): Payer: Medicare HMO | Admitting: Physician Assistant

## 2018-02-20 ENCOUNTER — Encounter (INDEPENDENT_AMBULATORY_CARE_PROVIDER_SITE_OTHER): Payer: Self-pay | Admitting: Orthopaedic Surgery

## 2018-02-20 ENCOUNTER — Ambulatory Visit (INDEPENDENT_AMBULATORY_CARE_PROVIDER_SITE_OTHER): Payer: Medicare HMO

## 2018-02-20 DIAGNOSIS — S42201D Unspecified fracture of upper end of right humerus, subsequent encounter for fracture with routine healing: Secondary | ICD-10-CM

## 2018-02-20 DIAGNOSIS — J449 Chronic obstructive pulmonary disease, unspecified: Secondary | ICD-10-CM | POA: Diagnosis not present

## 2018-02-20 DIAGNOSIS — I10 Essential (primary) hypertension: Secondary | ICD-10-CM | POA: Diagnosis not present

## 2018-02-20 DIAGNOSIS — S12390D Other displaced fracture of fourth cervical vertebra, subsequent encounter for fracture with routine healing: Secondary | ICD-10-CM | POA: Diagnosis not present

## 2018-02-20 DIAGNOSIS — D62 Acute posthemorrhagic anemia: Secondary | ICD-10-CM | POA: Diagnosis not present

## 2018-02-20 DIAGNOSIS — S12290D Other displaced fracture of third cervical vertebra, subsequent encounter for fracture with routine healing: Secondary | ICD-10-CM | POA: Diagnosis not present

## 2018-02-20 DIAGNOSIS — S12190D Other displaced fracture of second cervical vertebra, subsequent encounter for fracture with routine healing: Secondary | ICD-10-CM | POA: Diagnosis not present

## 2018-02-20 DIAGNOSIS — S065X0D Traumatic subdural hemorrhage without loss of consciousness, subsequent encounter: Secondary | ICD-10-CM | POA: Diagnosis not present

## 2018-02-20 DIAGNOSIS — S42291D Other displaced fracture of upper end of right humerus, subsequent encounter for fracture with routine healing: Secondary | ICD-10-CM | POA: Diagnosis not present

## 2018-02-20 DIAGNOSIS — S066X0D Traumatic subarachnoid hemorrhage without loss of consciousness, subsequent encounter: Secondary | ICD-10-CM | POA: Diagnosis not present
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
## 2018-02-21 DIAGNOSIS — R69 Illness, unspecified: Secondary | ICD-10-CM | POA: Diagnosis not present

## 2018-02-27 ENCOUNTER — Encounter (INDEPENDENT_AMBULATORY_CARE_PROVIDER_SITE_OTHER): Payer: Self-pay | Admitting: Orthopaedic Surgery

## 2018-02-27 ENCOUNTER — Telehealth (INDEPENDENT_AMBULATORY_CARE_PROVIDER_SITE_OTHER): Payer: Self-pay | Admitting: Orthopaedic Surgery

## 2018-02-27 DIAGNOSIS — J449 Chronic obstructive pulmonary disease, unspecified: Secondary | ICD-10-CM | POA: Diagnosis not present

## 2018-02-27 DIAGNOSIS — D62 Acute posthemorrhagic anemia: Secondary | ICD-10-CM | POA: Diagnosis not present

## 2018-02-27 DIAGNOSIS — S065X0D Traumatic subdural hemorrhage without loss of consciousness, subsequent encounter: Secondary | ICD-10-CM | POA: Diagnosis not present

## 2018-02-27 DIAGNOSIS — I10 Essential (primary) hypertension: Secondary | ICD-10-CM | POA: Diagnosis not present

## 2018-02-27 DIAGNOSIS — S12190D Other displaced fracture of second cervical vertebra, subsequent encounter for fracture with routine healing: Secondary | ICD-10-CM | POA: Diagnosis not present

## 2018-02-27 DIAGNOSIS — S42291D Other displaced fracture of upper end of right humerus, subsequent encounter for fracture with routine healing: Secondary | ICD-10-CM | POA: Diagnosis not present

## 2018-02-27 DIAGNOSIS — S12390D Other displaced fracture of fourth cervical vertebra, subsequent encounter for fracture with routine healing: Secondary | ICD-10-CM | POA: Diagnosis not present

## 2018-02-27 DIAGNOSIS — S12290D Other displaced fracture of third cervical vertebra, subsequent encounter for fracture with routine healing: Secondary | ICD-10-CM | POA: Diagnosis not present

## 2018-02-27 DIAGNOSIS — S066X0D Traumatic subarachnoid hemorrhage without loss of consciousness, subsequent encounter: Secondary | ICD-10-CM | POA: Diagnosis not present

## 2018-02-27 NOTE — Telephone Encounter (Signed)
See message below °

## 2018-02-27 NOTE — Telephone Encounter (Signed)
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  PLEASE CALL PATIENT TO ADVISE.  [phone]

## 2018-02-27 NOTE — Telephone Encounter (Signed)
That's fine.  His PPI is 20% of the RUE

## 2018-03-01 ENCOUNTER — Encounter (INDEPENDENT_AMBULATORY_CARE_PROVIDER_SITE_OTHER): Payer: Self-pay

## 2018-03-01 NOTE — Telephone Encounter (Signed)
Called patient no answer.

## 2018-03-01 NOTE — Telephone Encounter (Signed)
Letter ready for pick up

## 2018-03-02 DIAGNOSIS — E559 Vitamin D deficiency, unspecified: Secondary | ICD-10-CM | POA: Diagnosis not present

## 2018-03-02 DIAGNOSIS — E119 Type 2 diabetes mellitus without complications: Secondary | ICD-10-CM | POA: Diagnosis not present

## 2018-03-02 DIAGNOSIS — I10 Essential (primary) hypertension: Secondary | ICD-10-CM | POA: Diagnosis not present

## 2018-03-02 DIAGNOSIS — E782 Mixed hyperlipidemia: Secondary | ICD-10-CM | POA: Diagnosis not present

## 2018-04-06 ENCOUNTER — Ambulatory Visit (INDEPENDENT_AMBULATORY_CARE_PROVIDER_SITE_OTHER): Payer: Medicare HMO

## 2018-04-06 ENCOUNTER — Encounter (INDEPENDENT_AMBULATORY_CARE_PROVIDER_SITE_OTHER): Payer: Self-pay | Admitting: Orthopaedic Surgery

## 2018-04-06 ENCOUNTER — Ambulatory Visit (INDEPENDENT_AMBULATORY_CARE_PROVIDER_SITE_OTHER): Payer: Medicare HMO | Admitting: Orthopaedic Surgery

## 2018-04-06 DIAGNOSIS — S42201D Unspecified fracture of upper end of right humerus, subsequent encounter for fracture with routine healing: Secondary | ICD-10-CM | POA: Diagnosis not present
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
## 2018-05-12 DIAGNOSIS — R69 Illness, unspecified: Secondary | ICD-10-CM | POA: Diagnosis not present

## 2018-05-15 DIAGNOSIS — Z23 Encounter for immunization: Secondary | ICD-10-CM | POA: Diagnosis not present

## 2018-06-01 DIAGNOSIS — E119 Type 2 diabetes mellitus without complications: Secondary | ICD-10-CM | POA: Diagnosis not present

## 2018-06-01 DIAGNOSIS — E559 Vitamin D deficiency, unspecified: Secondary | ICD-10-CM | POA: Diagnosis not present

## 2018-06-01 DIAGNOSIS — I10 Essential (primary) hypertension: Secondary | ICD-10-CM | POA: Diagnosis not present

## 2018-06-01 DIAGNOSIS — E7800 Pure hypercholesterolemia, unspecified: Secondary | ICD-10-CM | POA: Diagnosis not present

## 2018-06-04 DIAGNOSIS — R69 Illness, unspecified: Secondary | ICD-10-CM | POA: Diagnosis not present

## 2018-09-11 DIAGNOSIS — J449 Chronic obstructive pulmonary disease, unspecified: Secondary | ICD-10-CM | POA: Diagnosis not present

## 2018-09-11 DIAGNOSIS — E7800 Pure hypercholesterolemia, unspecified: Secondary | ICD-10-CM | POA: Diagnosis not present

## 2018-09-11 DIAGNOSIS — E119 Type 2 diabetes mellitus without complications: Secondary | ICD-10-CM | POA: Diagnosis not present

## 2018-09-11 DIAGNOSIS — I10 Essential (primary) hypertension: Secondary | ICD-10-CM | POA: Diagnosis not present

## 2018-09-12 DIAGNOSIS — R69 Illness, unspecified: Secondary | ICD-10-CM | POA: Diagnosis not present

## 2018-09-25 DIAGNOSIS — I10 Essential (primary) hypertension: Secondary | ICD-10-CM | POA: Diagnosis not present

## 2018-09-25 DIAGNOSIS — E7800 Pure hypercholesterolemia, unspecified: Secondary | ICD-10-CM | POA: Diagnosis not present

## 2018-09-25 DIAGNOSIS — J449 Chronic obstructive pulmonary disease, unspecified: Secondary | ICD-10-CM | POA: Diagnosis not present

## 2018-09-25 DIAGNOSIS — E119 Type 2 diabetes mellitus without complications: Secondary | ICD-10-CM | POA: Diagnosis not present

## 2018-10-30 DIAGNOSIS — H9201 Otalgia, right ear: Secondary | ICD-10-CM | POA: Diagnosis not present
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
## 2019-08-09 IMAGING — CT CT ADDITIONAL VIEWS AT NO CHARGE
2 series · 12 of 36 positions shown, 15 images · non-contrast
Comparison: Radiograph same date.

CLINICAL DATA: Proximal right humeral fracture.

EXAM:
CT OF THE UPPER RIGHT EXTREMITY WITHOUT CONTRAST
3-DIMENSIONAL CT IMAGE RENDERING ON ACQUISITION WORKSTATION
TECHNIQUE: Multidetector CT images of the right shoulder were reconstructed
from the previous chest CT.
3-dimensional CT images were rendered by post-processing of the
original CT data on an acquisition workstation. The 3-dimensional CT
images were interpreted and findings were reported in the
accompanying complete CT report for this study

[Series 12: rt ax shoulder · axial · 0.49mm/px · z∈[-519,-328]mm · 11 of 114 slices shown, 13 images]
[im 8/114  soft-tissue]
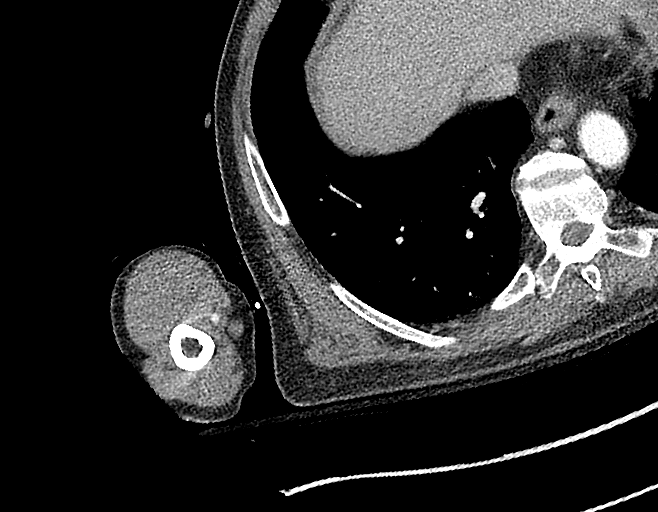
[im 8/114  bone]
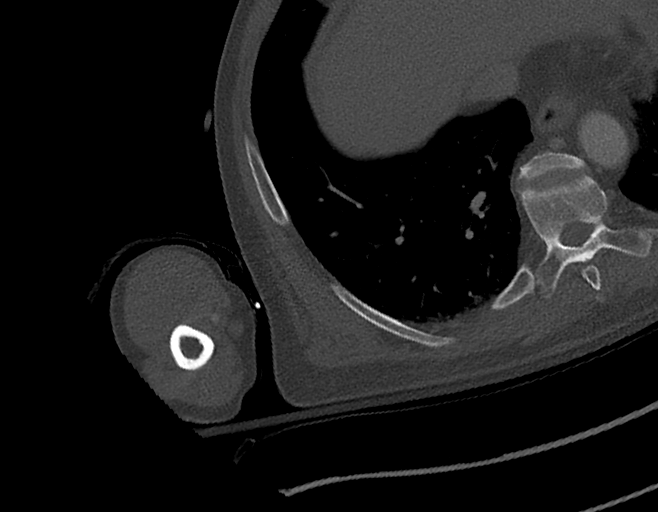
[im 22/114  soft-tissue]
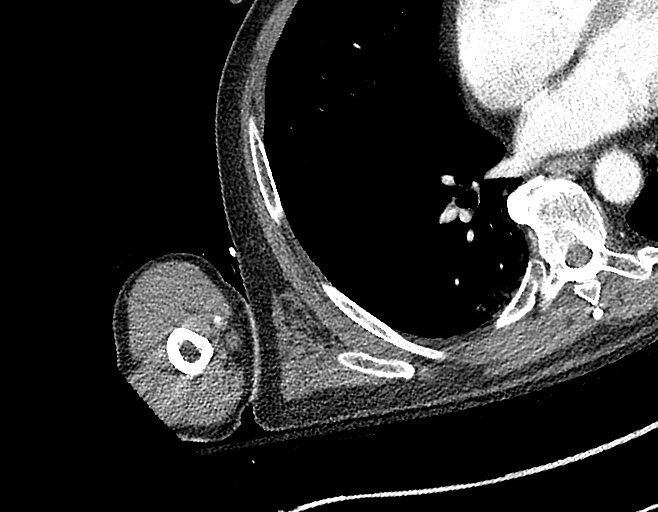
[im 37/114  soft-tissue]
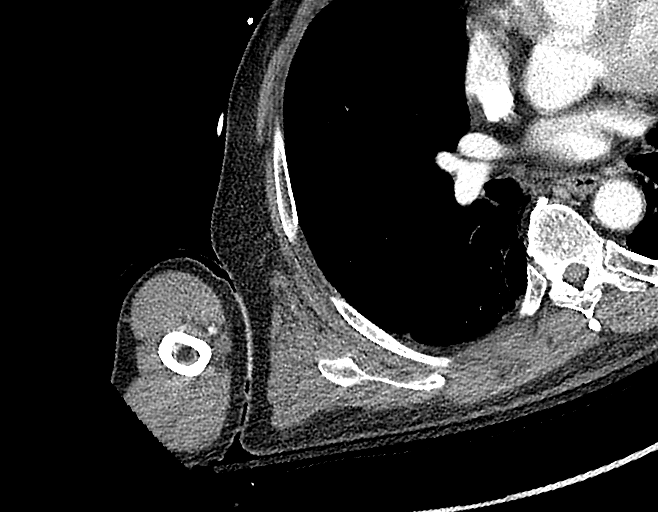
[im 52/114  soft-tissue]
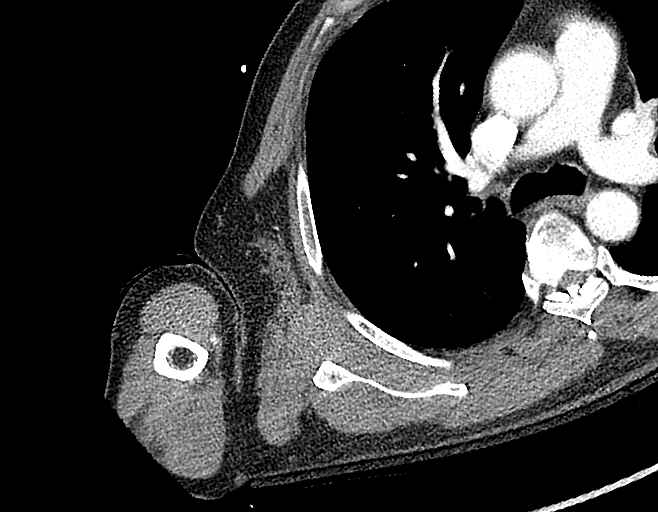
[im 62/114  soft-tissue]
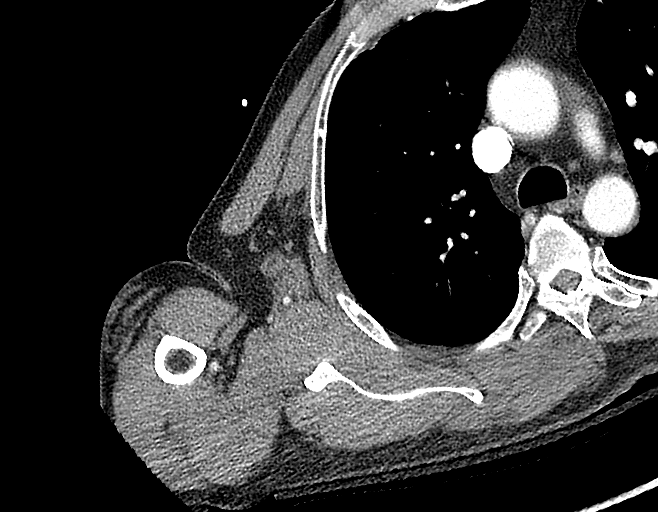
[im 77/114  soft-tissue]
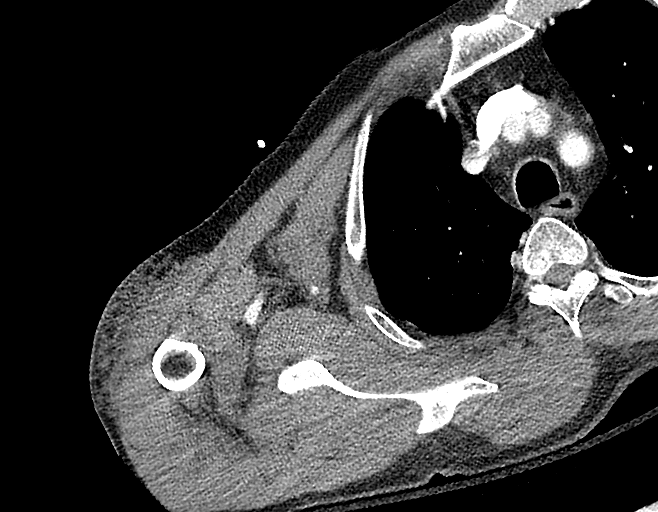
[im 92/114  soft-tissue]
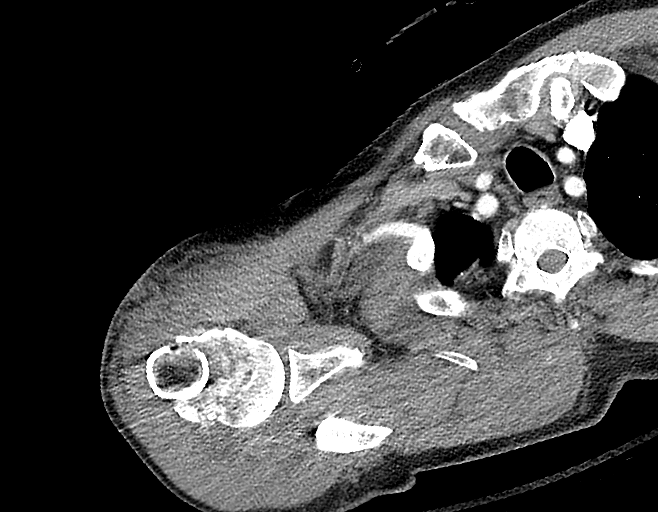
[im 99/114  lung]
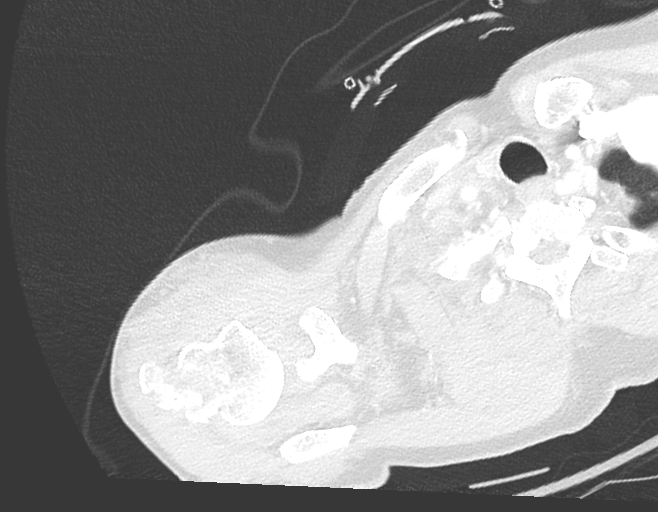
[im 103/114  lung]
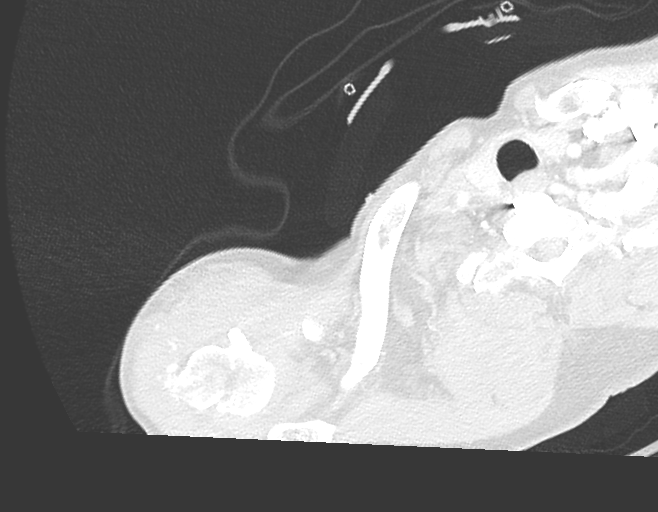
[im 106/114  soft-tissue]
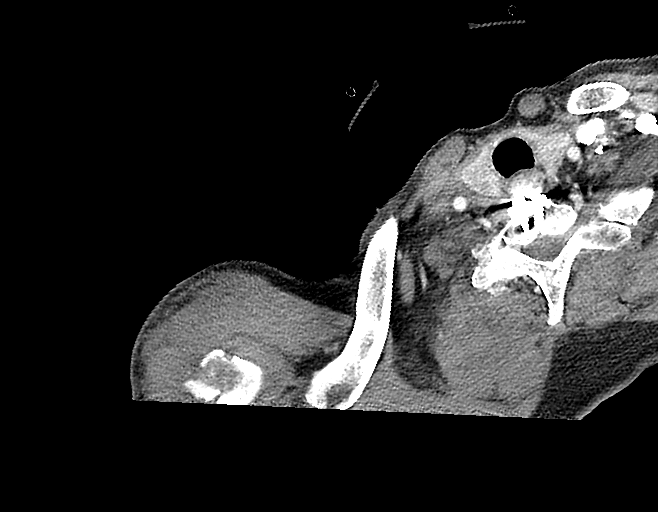
[im 106/114  lung]
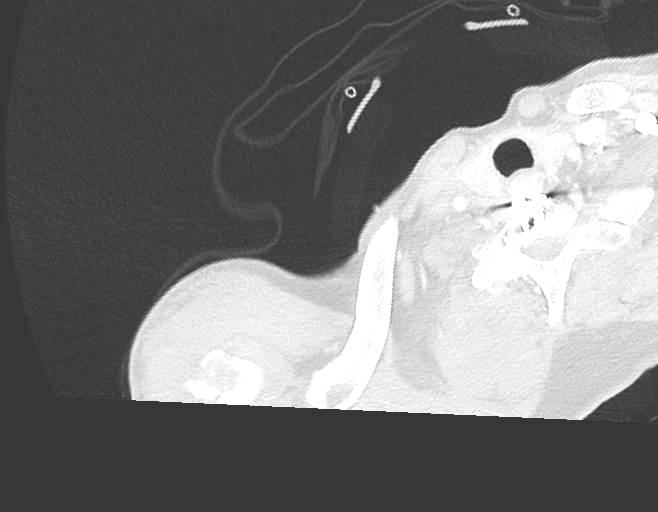
[im 110/114  lung]
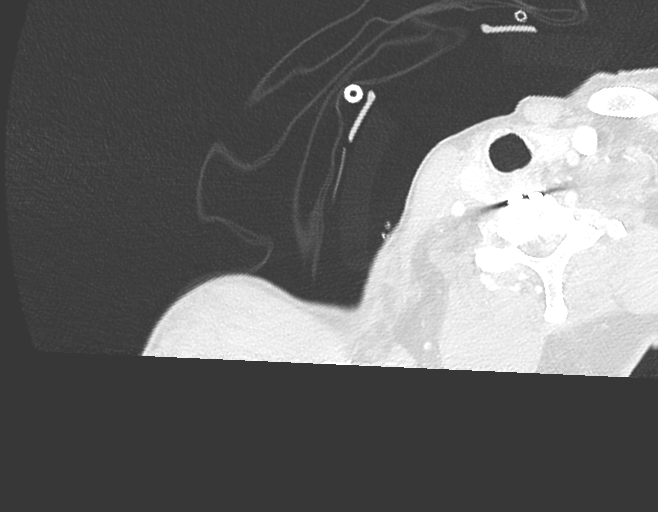

[Series 15: rt sag shoulder · sagittal · 0.49mm/px · 1 of 155 slices shown, 2 images]
[im 52/155  soft-tissue]
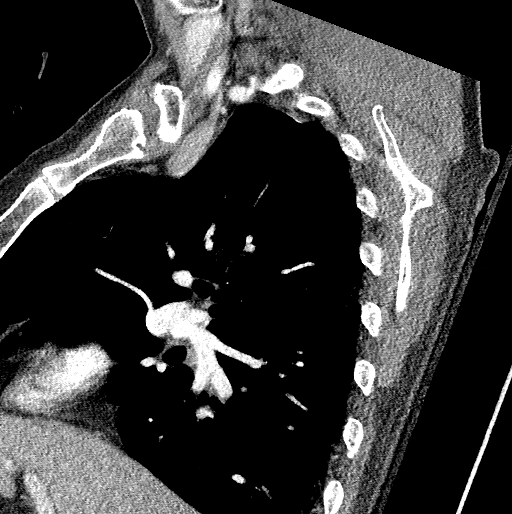
[im 52/155  bone]
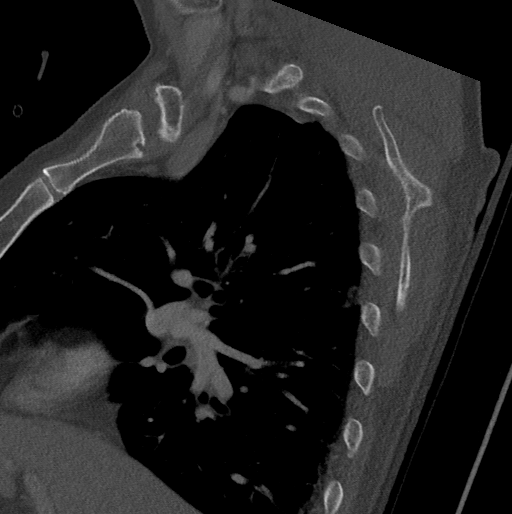

[12 of 36 positions shown; findings below may reference images not displayed]

FINDINGS: Bones/Joint/Cartilage

Study was reconstructed from the chest CT performed earlier.
Comminuted fracture of the right humeral neck demonstrates marked
apex lateral angulation and impaction by up to 2.4 cm on coronal
image 93. The greater tuberosity and acromioclavicular joint are
incompletely visualized by this examination. There is no involvement
of the humeral head articular surface. There is no glenohumeral
dislocation. A small shoulder joint effusion is present.

The right clavicle and right scapula appear intact. There are
postsurgical changes status post lower cervical fusion.

Ligaments

Not relevant for exam/indication.

Muscles and Tendons

Unremarkable.

Soft tissues

There is edema/hematoma in the subcutaneous fat anterior to proximal
right humerus.
IMPRESSION: 1. Comminuted and markedly angulated fracture of the right humeral
neck.
2. No involvement of the humeral head articular surface, scapular
fracture or dislocation.

## 2022-04-21 ENCOUNTER — Telehealth: Payer: Self-pay

## 2022-04-21 NOTE — Telephone Encounter (Signed)
Att to contact pt to advise that appt for 10/23 needs to be changed to virtual visit for that day or rescheduled for another day if pt wishes to be in person no ans no vm left  

## 2022-04-26 ENCOUNTER — Encounter (INDEPENDENT_AMBULATORY_CARE_PROVIDER_SITE_OTHER): Payer: Self-pay
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
## 2022-05-02 ENCOUNTER — Ambulatory Visit: Payer: Medicare HMO | Admitting: Family

## 2022-05-02 DIAGNOSIS — Z7689 Persons encountering health services in other specified circumstances: Secondary | ICD-10-CM
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
## 2022-12-28 ENCOUNTER — Ambulatory Visit (INDEPENDENT_AMBULATORY_CARE_PROVIDER_SITE_OTHER): Payer: Medicare HMO | Admitting: Family

## 2022-12-28 ENCOUNTER — Encounter: Payer: Self-pay | Admitting: Family

## 2022-12-28 VITALS — BP 140/78 | HR 73 | Temp 98.1°F | Resp 18 | Ht 67.0 in | Wt 148.2 lb

## 2022-12-28 DIAGNOSIS — Z13220 Encounter for screening for lipoid disorders: Secondary | ICD-10-CM

## 2022-12-28 DIAGNOSIS — E119 Type 2 diabetes mellitus without complications: Secondary | ICD-10-CM

## 2022-12-28 DIAGNOSIS — E1165 Type 2 diabetes mellitus with hyperglycemia: Secondary | ICD-10-CM | POA: Diagnosis not present

## 2022-12-28 DIAGNOSIS — Z7689 Persons encountering health services in other specified circumstances: Secondary | ICD-10-CM

## 2022-12-28 DIAGNOSIS — L602 Onychogryphosis: Secondary | ICD-10-CM | POA: Diagnosis not present

## 2022-12-28 DIAGNOSIS — B351 Tinea unguium: Secondary | ICD-10-CM

## 2022-12-28 DIAGNOSIS — Z7984 Long term (current) use of oral hypoglycemic drugs: Secondary | ICD-10-CM

## 2022-12-28 LAB — POCT GLYCOSYLATED HEMOGLOBIN (HGB A1C): HbA1c, POC (controlled diabetic range): 6 % (ref 0.0–7.0)

## 2022-12-28 MED ORDER — METFORMIN HCL ER 500 MG PO TB24
500.0000 mg | ORAL_TABLET | Freq: Two times a day (BID) | ORAL | 0 refills | Status: AC
Start: 2022-12-28 — End: 2023-03-28

## 2022-12-28 NOTE — Patient Instructions (Signed)
Thank you for choosing Primary Care at [facility] for your medical home!    [name] [name] was seen by Rema Fendt, NP today.   [name] [name]'s primary care provider is Ricky Stabs, NP.   For the best care possible,  you should try to see Ricky Stabs, NP whenever you come to office.   We look forward to seeing you again soon!  If you have any questions about your visit today,  please call us at [phone]  Or feel free to reach your provider via MyChart.   Keeping you healthy   Get these tests Blood pressure- Have your blood pressure checked once a year by your healthcare provider.  Normal blood pressure is 120/80. Weight- Have your body mass index (BMI) calculated to screen for obesity.  BMI is a measure of body fat based on height and weight. You can also calculate your own BMI at https://www.west-esparza.com/. Cholesterol- Have your cholesterol checked regularly starting at age 78, sooner may be necessary if you have diabetes, high blood pressure, if a family member developed heart diseases at an early age or if you smoke.  Chlamydia, HIV, and other sexual transmitted disease- Get screened each year until the age of 83 then within three months of each new sexual partner. Diabetes- Have your blood sugar checked regularly if you have high blood pressure, high cholesterol, a family history of diabetes or if you are overweight.   Get these vaccines Flu shot- Every fall. Tetanus shot- Every 10 years. Menactra- Single dose; prevents meningitis.   Take these steps Don't smoke- If you do smoke, ask your healthcare provider about quitting. For tips on how to quit, go to www.smokefree.gov or call 1-800-QUIT-NOW. Be physically active- Exercise 5 days a week for at least 30 minutes.  If you are not already physically active start slow and gradually work up to 30 minutes of moderate physical activity.  Examples of moderate activity include walking briskly, mowing the yard, dancing,  swimming bicycling, etc. Eat a healthy diet- Eat a variety of healthy foods such as fruits, vegetables, low fat milk, low fat cheese, yogurt, lean meats, poultry, fish, beans, tofu, etc.  For more information on healthy eating, go to www.thenutritionsource.org Drink alcohol in moderation- Limit alcohol intake two drinks or less a day.  Never drink and drive. Dentist- Brush and floss teeth twice daily; visit your dentis twice a year. Depression-Your emotional health is as important as your physical health.  If you're feeling down, losing interest in things you normally enjoy please talk with your healthcare provider. Gun Safety- If you keep a gun in your home, keep it unloaded and with the safety lock on.  Bullets should be stored separately. Helmet use- Always wear a helmet when riding a motorcycle, bicycle, rollerblading or skateboarding. Safe sex- If you may be exposed to a sexually transmitted infection, use a condom Seat belts- Seat bels can save your life; always wear one. Smoke/Carbon Monoxide detectors- These detectors need to be installed on the appropriate level of your home.  Replace batteries at least once a year. Skin Cancer- When out in the sun, cover up and use sunscreen SPF 15 or higher. Violence- If anyone is threatening or hurting you, please tell your healthcare provider.

## 2022-12-29 ENCOUNTER — Other Ambulatory Visit: Payer: Self-pay | Admitting: Family

## 2022-12-29 LAB — CMP14+EGFR
ALT: 16 IU/L (ref 0–44)
AST: 15 IU/L (ref 0–40)
Albumin: 4.3 g/dL (ref 3.8–4.8)
Alkaline Phosphatase: 69 IU/L (ref 44–121)
BUN/Creatinine Ratio: 17 (ref 10–24)
BUN: 21 mg/dL (ref 8–27)
Bilirubin Total: 0.5 mg/dL (ref 0.0–1.2)
CO2: 24 mmol/L (ref 20–29)
Calcium: 9.5 mg/dL (ref 8.6–10.2)
Chloride: 102 mmol/L (ref 96–106)
Creatinine, Ser: 1.24 mg/dL (ref 0.76–1.27)
Globulin, Total: 1.8 g/dL (ref 1.5–4.5)
Glucose: 97 mg/dL (ref 70–99)
Potassium: 5.1 mmol/L (ref 3.5–5.2)
Sodium: 139 mmol/L (ref 134–144)
Total Protein: 6.1 g/dL (ref 6.0–8.5)
eGFR: 61 mL/min/{1.73_m2} (ref >59)

## 2022-12-29 LAB — LIPID PANEL
Chol/HDL Ratio: 3.4 ratio (ref 0.0–5.0)
Cholesterol, Total: 136 mg/dL (ref 100–199)
HDL: 40 mg/dL (ref >39)
LDL Chol Calc (NIH): 74 mg/dL (ref 0–99)
Triglycerides: 125 mg/dL (ref 0–149)
VLDL Cholesterol Cal: 22 mg/dL (ref 5–40)

## 2022-12-29 LAB — MICROALBUMIN / CREATININE URINE RATIO
Creatinine, Urine: 173.9 mg/dL
Microalb/Creat Ratio: 14 mg/g creat (ref 0–29)
Microalbumin, Urine: 24.5 ug/mL

## 2022-12-29 NOTE — Telephone Encounter (Signed)
Medication Refill - Medication:  Ipratropium-Albuterol (COMBIVENT RESPIMAT) 20-100 MCG/ACT AERS respimat/ losartan 100mg / /metoprolol succinate (TOPROL-XL) 25 MG 24 hr tablet /potassium chloride (KLOR-CON) 20 MEQ packet   Pt doesn't have contact with previous Dr for refills  Has the patient contacted their pharmacy? yes (Agent: If no, request that the patient contact the pharmacy for the refill. If patient does not wish to contact the pharmacy document the reason why and proceed with request.) (Agent: If yes, when and what did the pharmacy advise?)contact pcp  Preferred Pharmacy (with phone number or street name): [facility] [address] [place], [place] - [address]  Phone: [phone] Fax: [phone]  Has the patient been seen for an appointment in the last year OR does the patient have an upcoming appointment? yes  Agent: Please be advised that RX refills may take up to 3 business days. We ask that you follow-up with your pharmacy.

## 2022-12-29 NOTE — Telephone Encounter (Signed)
Requested medication (s) are due for refill today: No  Requested medication (s) are on the active medication list: yes    Last refill:  Potassium and Losartan  LRF 12/28/22   Quantity not specified                       LRF Ipratropium 08/01/12     Metoprolol  12/25/17  Quantity not specified  Future visit scheduled Yes 04/04/23  Notes to clinic:All meds per historical providers. Cannot refuse per protocol. Please review. Thank you.  Requested Prescriptions  Pending Prescriptions Disp Refills   Ipratropium-Albuterol (COMBIVENT RESPIMAT) 20-100 MCG/ACT AERS respimat      Sig: Inhale 1 puff into the lungs daily.     Pulmonology:  Combination Products - albuterol / ipratropium Failed - 12/29/2022 10:02 AM      Failed - Last BP in normal range    BP Readings from Last 1 Encounters:  12/28/22 (!) 140/78         Passed - Last Heart Rate in normal range    Pulse Readings from Last 1 Encounters:  12/28/22 73         Passed - Valid encounter within last 12 months    Recent Outpatient Visits           Yesterday Encounter to establish care   [facility] at [facility], [place], NP       Future Appointments             In 3 months Rema Fendt, NP [facility] Primary Care at [facility]             metoprolol succinate (TOPROL-XL) 25 MG 24 hr tablet      Sig: Take 1 tablet (25 mg total) by mouth every Monday, Wednesday, and Friday.     Cardiovascular:  Beta Blockers Failed - 12/29/2022 10:02 AM      Failed - Last BP in normal range    BP Readings from Last 1 Encounters:  12/28/22 (!) 140/78         Passed - Last Heart Rate in normal range    Pulse Readings from Last 1 Encounters:  12/28/22 73         Passed - Valid encounter within last 6 months    Recent Outpatient Visits           Yesterday Encounter to establish care   [facility] Care at [facility], [place], NP       Future Appointments             In 3 months  Rema Fendt, NP [facility] Primary Care at [facility]             potassium chloride (KLOR-CON) 20 MEQ packet      Sig: Take by mouth 2 (two) times daily.     Endocrinology:  Minerals - Potassium Supplementation Failed - 12/29/2022 10:02 AM      Failed - K in normal range and within 360 days    Potassium  Date Value Ref Range Status  12/27/2017 4.3 3.5 - 5.1 mmol/L Final         Failed - Cr in normal range and within 360 days    Creatinine, Ser  Date Value Ref Range Status  12/27/2017 0.89 0.61 - 1.24 mg/dL Final         Passed - Valid encounter within last 12  months    Recent Outpatient Visits           Yesterday Encounter to establish care   [facility] Primary Care at [facility], [place], NP       Future Appointments             In 3 months Rema Fendt, NP [facility] Primary Care at [facility]             losartan (COZAAR) 100 MG tablet      Sig: Take 1 tablet (100 mg total) by mouth daily.     Cardiovascular:  Angiotensin Receptor Blockers Failed - 12/29/2022 10:02 AM      Failed - Cr in normal range and within 180 days    Creatinine, Ser  Date Value Ref Range Status  12/27/2017 0.89 0.61 - 1.24 mg/dL Final         Failed - K in normal range and within 180 days    Potassium  Date Value Ref Range Status  12/27/2017 4.3 3.5 - 5.1 mmol/L Final         Failed - Last BP in normal range    BP Readings from Last 1 Encounters:  12/28/22 (!) 140/78         Passed - Patient is not pregnant      Passed - Valid encounter within last 6 months    Recent Outpatient Visits           Yesterday Encounter to establish care   [facility] at [facility], [place], [place]       Future Appointments             In 3 months Rema Fendt, NP [facility] Primary Care at [facility]

## 2023-01-19 NOTE — Telephone Encounter (Signed)
Call patient with update. Requested medications appear as historical. Please schedule patient an appointment soon. Thank you.

## 2023-01-24 NOTE — Telephone Encounter (Signed)
Please call patient with update and let me know if I can further assist. Thank you.

## 2023-01-25 ENCOUNTER — Other Ambulatory Visit: Payer: Self-pay | Admitting: Family

## 2023-01-25 NOTE — Telephone Encounter (Signed)
Medication Refill - Medication: potassium chloride (KLOR-CON) 20 MEQ packet , albuterol (VENTOLIN HFA) 108 (90 Base) MCG/ACT inhaler , Ipratropium-Albuterol (COMBIVENT RESPIMAT) 20-100 MCG/ACT AERS respimat , losartan (COZAAR) 50 MG tablet , metoprolol succinate (TOPROL-XL) 25 MG 24 hr tablet , predniSONE (DELTASONE) 10 MG tablet , glucose blood test strip   Has the patient contacted their pharmacy? No.   Preferred Pharmacy (with phone number or street name):  [facility] [address] [place], [place] - [address] Phone: [phone]  Fax: [phone]      Has the patient been seen for an appointment in the last year OR does the patient have an upcoming appointment? No.  The patient states he is out of all of these medications and needs as soon as  possible. Please assist patient further

## 2023-01-26 NOTE — Telephone Encounter (Signed)
Requested medication (s) are due for refill today: yes  Requested medication (s) are on the active medication list: yes  Last refill:  multiple dates  Future visit scheduled: yes  Notes to clinic:  Unable to refill per protocol, last refill by historical provider.Routing for approval.      Requested Prescriptions  Pending Prescriptions Disp Refills   albuterol (VENTOLIN HFA) 108 (90 Base) MCG/ACT inhaler       Pulmonology:  Beta Agonists 2 Failed - 01/25/2023  4:44 PM      Failed - Last BP in normal range    BP Readings from Last 1 Encounters:  12/28/22 (!) 140/78         Passed - Last Heart Rate in normal range    Pulse Readings from Last 1 Encounters:  12/28/22 73         Passed - Valid encounter within last 12 months    Recent Outpatient Visits           4 weeks ago Encounter to establish care   [facility] at [facility], [place], [place]       Future Appointments             In 2 months Rema Fendt, NP [facility] at [facility]             potassium chloride (KLOR-CON) 20 MEQ packet      Sig: Take by mouth 2 (two) times daily.     Endocrinology:  Minerals - Potassium Supplementation Passed - 01/25/2023  4:44 PM      Passed - K in normal range and within 360 days    Potassium  Date Value Ref Range Status  12/28/2022 5.1 3.5 - 5.2 mmol/L Final         Passed - Cr in normal range and within 360 days    Creatinine, Ser  Date Value Ref Range Status  12/28/2022 1.24 0.76 - 1.27 mg/dL Final         Passed - Valid encounter within last 12 months    Recent Outpatient Visits           4 weeks ago Encounter to establish care   [facility] at [facility], [place], [place]       Future Appointments             In 2 months Rema Fendt, NP [facility] at [facility]             Ipratropium-Albuterol (COMBIVENT RESPIMAT) 20-100 MCG/ACT AERS respimat      Sig: Inhale 1  puff into the lungs daily.     Pulmonology:  Combination Products - albuterol / ipratropium Failed - 01/25/2023  4:44 PM      Failed - Last BP in normal range    BP Readings from Last 1 Encounters:  12/28/22 (!) 140/78         Passed - Last Heart Rate in normal range    Pulse Readings from Last 1 Encounters:  12/28/22 73         Passed - Valid encounter within last 12 months    Recent Outpatient Visits           4 weeks ago Encounter to establish care   [facility] Care at [facility], [place], [place]       Future Appointments  In 2 months Rema Fendt, NP [place] [facility] at [facility]             losartan (COZAAR) 100 MG tablet      Sig: Take 1 tablet (100 mg total) by mouth daily.     Cardiovascular:  Angiotensin Receptor Blockers Failed - 01/25/2023  4:44 PM      Failed - Last BP in normal range    BP Readings from Last 1 Encounters:  12/28/22 (!) 140/78         Passed - Cr in normal range and within 180 days    Creatinine, Ser  Date Value Ref Range Status  12/28/2022 1.24 0.76 - 1.27 mg/dL Final         Passed - K in normal range and within 180 days    Potassium  Date Value Ref Range Status  12/28/2022 5.1 3.5 - 5.2 mmol/L Final         Passed - Patient is not pregnant      Passed - Valid encounter within last 6 months    Recent Outpatient Visits           4 weeks ago Encounter to establish care   [facility] Care at [facility], [place], [place]       Future Appointments             In 2 months Rema Fendt, NP [place] [facility] at [facility]             metoprolol succinate (TOPROL-XL) 25 MG 24 hr tablet      Sig: Take 1 tablet (25 mg total) by mouth every Monday, Wednesday, and Friday.     Cardiovascular:  Beta Blockers Failed - 01/25/2023  4:44 PM      Failed - Last BP in normal range    BP Readings from Last 1 Encounters:  12/28/22 (!) 140/78         Passed -  Last Heart Rate in normal range    Pulse Readings from Last 1 Encounters:  12/28/22 73         Passed - Valid encounter within last 6 months    Recent Outpatient Visits           4 weeks ago Encounter to establish care   [facility] Care at [facility], [place], [place]       Future Appointments             In 2 months Rema Fendt, NP [facility] Primary Care at [facility]             glucose blood test strip 100 each     Sig: 1 each by Other route as needed for other. Use as instructed     Endocrinology: Diabetes - Testing Supplies Passed - 01/25/2023  4:44 PM      Passed - Valid encounter within last 12 months    Recent Outpatient Visits           4 weeks ago Encounter to establish care   [facility] at [facility], [place], [place]       Future Appointments             In 2 months Rema Fendt, NP [facility] Primary Care at [facility]             predniSONE (DELTASONE) 10 MG tablet  Not Delegated - Endocrinology:  Oral Corticosteroids Failed - 01/25/2023  4:44 PM      Failed - This refill cannot be delegated      Failed - Manual Review: Eye exam for IOP if prolonged treatment      Failed - Last BP in normal range    BP Readings from Last 1 Encounters:  12/28/22 (!) 140/78         Failed - Bone Mineral Density or Dexa Scan completed in the last 2 years      Passed - Glucose (serum) in normal range and within 180 days    Glucose  Date Value Ref Range Status  12/28/2022 97 70 - 99 mg/dL Final   Glucose, Bld  Date Value Ref Range Status  12/27/2017 105 (H) 65 - 99 mg/dL Final   Glucose-Capillary  Date Value Ref Range Status  01/02/2018 86 70 - 99 mg/dL Final         Passed - K in normal range and within 180 days    Potassium  Date Value Ref Range Status  12/28/2022 5.1 3.5 - 5.2 mmol/L Final         Passed - Na in normal range and within 180 days    Sodium  Date Value Ref Range Status   12/28/2022 139 134 - 144 mmol/L Final         Passed - Valid encounter within last 6 months    Recent Outpatient Visits           4 weeks ago Encounter to establish care   [facility] Care at [facility], [place], [place]       Future Appointments             In 2 months Rema Fendt, NP [facility] Care at [facility]

## 2023-01-27 MED ORDER — LOSARTAN POTASSIUM 100 MG PO TABS: 100.0000 mg | ORAL_TABLET | Freq: Every day | ORAL | 0 refills | Status: AC

## 2023-01-27 MED ORDER — POTASSIUM CHLORIDE 20 MEQ PO PACK: 20.0000 meq | PACK | Freq: Two times a day (BID) | ORAL | 0 refills | Status: AC

## 2023-01-27 MED ORDER — METOPROLOL SUCCINATE ER 25 MG PO TB24: 25.0000 mg | ORAL_TABLET | ORAL | 0 refills | Status: AC

## 2023-01-31 NOTE — Telephone Encounter (Signed)
-   Potassium Chloride, Losartan, and Metoprolol prescribed 01/27/2023 by Georganna Skeans, MD.  - Albuterol inhaler, Ipratropium-Albuterol, glucose blood test strip, and Prednisone appear as historical medications please schedule patient an appointment soon. During the interim report to the Emergency Department/Urgent Care for immediate medical evaluation. Thank you.

## 2023-02-08 ENCOUNTER — Telehealth: Payer: Self-pay

## 2023-02-08 NOTE — Telephone Encounter (Signed)
This nurse called patient for AWV. He said that he was going to be looking for another provider and did not want to do visit.

## 2023-02-09 NOTE — Progress Notes (Signed)
This encounter was created in error - please disregard.

## 2023-02-10 NOTE — Telephone Encounter (Signed)
-   Potassium Chloride, Losartan, and Metoprolol prescribed 01/27/2023 by Georganna Skeans, MD.  - Albuterol inhaler, Ipratropium-Albuterol, glucose blood test strip, and Prednisone appear as historical medications please schedule patient an appointment soon. During the interim report to the Emergency Department/Urgent Care for immediate medical evaluation. Thank you.

## 2023-02-13 NOTE — Telephone Encounter (Signed)
Schedule appointment. Please let me know if I can further assist. Thank you.

## 2023-04-04 ENCOUNTER — Ambulatory Visit: Payer: Medicare HMO | Admitting: Family

## 2024-02-07 NOTE — Telephone Encounter (Signed)
 Per chart patient was scheduled 04/04/23: Canceled (Unhappy/Changed Provider)
